# Patient Record
Sex: Male | Born: 1965 | Race: White | Hispanic: No | Marital: Married | Smoking: Never smoker
Health system: Southern US, Community
[De-identification: ages and names within clinical notes are randomized; demographics above are authoritative.]

## PROBLEM LIST (undated history)

## (undated) DIAGNOSIS — M1990 Unspecified osteoarthritis, unspecified site: Secondary | ICD-10-CM

## (undated) DIAGNOSIS — R519 Headache, unspecified: Secondary | ICD-10-CM

## (undated) DIAGNOSIS — T7840XA Allergy, unspecified, initial encounter: Secondary | ICD-10-CM

## (undated) DIAGNOSIS — E119 Type 2 diabetes mellitus without complications: Secondary | ICD-10-CM

## (undated) DIAGNOSIS — R51 Headache: Secondary | ICD-10-CM

## (undated) DIAGNOSIS — A4902 Methicillin resistant Staphylococcus aureus infection, unspecified site: Secondary | ICD-10-CM

## (undated) DIAGNOSIS — I10 Essential (primary) hypertension: Secondary | ICD-10-CM

## (undated) DIAGNOSIS — N200 Calculus of kidney: Secondary | ICD-10-CM

## (undated) DIAGNOSIS — R7301 Impaired fasting glucose: Secondary | ICD-10-CM

## (undated) HISTORY — PX: COLONOSCOPY: SHX174

## (undated) HISTORY — DX: Headache, unspecified: R51.9

## (undated) HISTORY — DX: Headache: R51

## (undated) HISTORY — DX: Allergy, unspecified, initial encounter: T78.40XA

## (undated) HISTORY — DX: Essential (primary) hypertension: I10

## (undated) HISTORY — DX: Calculus of kidney: N20.0

## (undated) HISTORY — PX: OTHER SURGICAL HISTORY: SHX169

## (undated) HISTORY — DX: Impaired fasting glucose: R73.01

## (undated) HISTORY — DX: Type 2 diabetes mellitus without complications: E11.9

## (undated) HISTORY — PX: ELBOW SURGERY: SHX618

## (undated) HISTORY — DX: Methicillin resistant Staphylococcus aureus infection, unspecified site: A49.02

## (undated) HISTORY — DX: Unspecified osteoarthritis, unspecified site: M19.90

---

## 2006-11-06 ENCOUNTER — Telehealth: Payer: Self-pay | Admitting: Family Medicine

## 2006-11-06 ENCOUNTER — Ambulatory Visit: Payer: Self-pay | Admitting: Family Medicine

## 2006-11-06 DIAGNOSIS — M109 Gout, unspecified: Secondary | ICD-10-CM | POA: Insufficient documentation

## 2006-11-06 HISTORY — DX: Gout, unspecified: M10.9

## 2006-11-07 ENCOUNTER — Encounter: Payer: Self-pay | Admitting: Family Medicine

## 2006-11-10 ENCOUNTER — Telehealth (INDEPENDENT_AMBULATORY_CARE_PROVIDER_SITE_OTHER): Payer: Self-pay | Admitting: *Deleted

## 2006-11-24 ENCOUNTER — Ambulatory Visit: Payer: Self-pay | Admitting: Family Medicine

## 2006-11-24 DIAGNOSIS — E1159 Type 2 diabetes mellitus with other circulatory complications: Secondary | ICD-10-CM | POA: Insufficient documentation

## 2006-11-24 DIAGNOSIS — I10 Essential (primary) hypertension: Secondary | ICD-10-CM | POA: Insufficient documentation

## 2006-11-24 DIAGNOSIS — I152 Hypertension secondary to endocrine disorders: Secondary | ICD-10-CM | POA: Insufficient documentation

## 2006-11-25 ENCOUNTER — Encounter: Payer: Self-pay | Admitting: Family Medicine

## 2006-11-25 LAB — CONVERTED CEMR LAB
Alkaline Phosphatase: 95 units/L (ref 39–117)
BUN: 11 mg/dL (ref 6–23)
CO2: 20 meq/L (ref 19–32)
Calcium: 9.3 mg/dL (ref 8.4–10.5)
Cholesterol: 179 mg/dL (ref 0–200)
Creatinine, Ser: 1.37 mg/dL (ref 0.40–1.50)
Glucose, Bld: 70 mg/dL (ref 70–99)
LDL Cholesterol: 105 mg/dL — ABNORMAL HIGH (ref 0–99)
Total Bilirubin: 0.5 mg/dL (ref 0.3–1.2)
Total CHOL/HDL Ratio: 3.7
Triglycerides: 126 mg/dL (ref <150)
VLDL: 25 mg/dL (ref 0–40)

## 2006-11-26 ENCOUNTER — Encounter: Payer: Self-pay | Admitting: Family Medicine

## 2007-01-05 ENCOUNTER — Ambulatory Visit: Payer: Self-pay | Admitting: Family Medicine

## 2007-02-16 ENCOUNTER — Ambulatory Visit: Payer: Self-pay | Admitting: Family Medicine

## 2007-02-16 DIAGNOSIS — K229 Disease of esophagus, unspecified: Secondary | ICD-10-CM | POA: Insufficient documentation

## 2007-05-01 ENCOUNTER — Ambulatory Visit: Payer: Self-pay | Admitting: Family Medicine

## 2007-05-01 DIAGNOSIS — M7020 Olecranon bursitis, unspecified elbow: Secondary | ICD-10-CM | POA: Insufficient documentation

## 2009-07-06 ENCOUNTER — Ambulatory Visit: Payer: Self-pay | Admitting: Family Medicine

## 2009-07-06 ENCOUNTER — Encounter: Admission: RE | Admit: 2009-07-06 | Discharge: 2009-07-06 | Payer: Self-pay | Admitting: Family Medicine

## 2009-07-14 ENCOUNTER — Encounter: Payer: Self-pay | Admitting: Family Medicine

## 2010-06-15 ENCOUNTER — Ambulatory Visit: Payer: Self-pay | Admitting: Family Medicine

## 2010-06-15 ENCOUNTER — Encounter: Payer: Self-pay | Admitting: Family Medicine

## 2010-06-15 DIAGNOSIS — R7301 Impaired fasting glucose: Secondary | ICD-10-CM | POA: Insufficient documentation

## 2010-06-15 DIAGNOSIS — D751 Secondary polycythemia: Secondary | ICD-10-CM

## 2010-06-15 DIAGNOSIS — R109 Unspecified abdominal pain: Secondary | ICD-10-CM | POA: Insufficient documentation

## 2010-06-15 DIAGNOSIS — R634 Abnormal weight loss: Secondary | ICD-10-CM | POA: Insufficient documentation

## 2010-06-15 HISTORY — DX: Secondary polycythemia: D75.1

## 2010-06-15 LAB — CONVERTED CEMR LAB
Bilirubin Urine: NEGATIVE
Blood Glucose, Fingerstick: 118
Eosinophils Relative: 2 % (ref 0–5)
Glucose, Urine, Semiquant: NEGATIVE
HCT: 56.5 % — ABNORMAL HIGH (ref 39.0–52.0)
Ketones, urine, test strip: NEGATIVE
MCHC: 34.2 g/dL (ref 30.0–36.0)
Monocytes Absolute: 0.9 10*3/uL (ref 0.1–1.0)
Monocytes Relative: 9 % (ref 3–12)
Neutrophils Relative %: 63 % (ref 43–77)
Platelets: 167 10*3/uL (ref 150–400)
RBC: 5.88 M/uL — ABNORMAL HIGH (ref 4.22–5.81)
RDW: 13.1 % (ref 11.5–15.5)
pH: 6

## 2010-06-21 LAB — CONVERTED CEMR LAB
Alkaline Phosphatase: 80 units/L (ref 39–117)
CO2: 25 meq/L (ref 19–32)
Chloride: 105 meq/L (ref 96–112)
Creatinine, Ser: 1.33 mg/dL (ref 0.40–1.50)
Glucose, Bld: 123 mg/dL — ABNORMAL HIGH (ref 70–99)
Potassium: 4.4 meq/L (ref 3.5–5.3)
Sodium: 144 meq/L (ref 135–145)
TSH: 1.888 microintl units/mL (ref 0.350–4.500)
Total Bilirubin: 0.6 mg/dL (ref 0.3–1.2)
Total Protein: 7.4 g/dL (ref 6.0–8.3)

## 2010-07-06 ENCOUNTER — Ambulatory Visit: Admit: 2010-07-06 | Payer: Self-pay | Admitting: Family Medicine

## 2010-07-20 ENCOUNTER — Encounter: Payer: Self-pay | Admitting: Family Medicine

## 2010-07-20 ENCOUNTER — Ambulatory Visit
Admission: RE | Admit: 2010-07-20 | Discharge: 2010-07-20 | Payer: Self-pay | Source: Home / Self Care | Attending: Family Medicine | Admitting: Family Medicine

## 2010-07-20 DIAGNOSIS — Z87442 Personal history of urinary calculi: Secondary | ICD-10-CM | POA: Insufficient documentation

## 2010-07-20 HISTORY — DX: Personal history of urinary calculi: Z87.442

## 2010-07-20 LAB — CONVERTED CEMR LAB
Bilirubin Urine: NEGATIVE
Glucose, Urine, Semiquant: NEGATIVE
Hgb A1c MFr Bld: 6.1 %
Ketones, urine, test strip: NEGATIVE
Nitrite: NEGATIVE
Protein, U semiquant: 100
Specific Gravity, Urine: >=1.03
Urobilinogen, UA: 0.2
WBC Urine, dipstick: NEGATIVE
pH: 5.5

## 2010-07-25 NOTE — Consult Note (Signed)
Summary: [facility]  [facility]   Imported By: Lanelle Bal 07/26/2009 08:49:51  _____________________________________________________________________  External Attachment:    Type:   Image     Comment:   External Document

## 2010-07-25 NOTE — Assessment & Plan Note (Signed)
Summary: olecranon bursitis   Vital Signs:  Patient profile:   45 year old male Height:      74 inches Weight:      277 pounds BMI:     35.69 Temp:     98.3 degrees F oral Pulse rate:   86 / minute BP sitting:   144 / 96  (left arm) Cuff size:   large  Vitals Entered By: Payton Spark CMA (July 06, 2009 11:08 AM) CC: F/U R elbow pain x 2 years Pain Assessment Patient in pain? yes     Location: R elbow Intensity: 9   Primary Care Provider:  Seymour Bars D.O.  CC:  F/U R elbow pain x 2 years.  History of Present Illness: 45 yo WM presents for continued R elbow pain x 2 yrs.  His swelling comes and goes.  He has a hard knot.  It stayes sore.  Not taking anything for pain.  Hurts day and night.  Limits full extension.  Denies repetitive trauma at work or at home.  Sensitive to touch.  Denies heat but has some redness.  Hit it about 2 yrs ago but never had it xrayed.  Has not tried wrapping the elbow.  Denies fevers.    Had it aspirated 2 yrs ago but came right back.    Current Medications (verified): 1)  None  Allergies (verified): No Known Drug Allergies  Past History:  Past Medical History: Reviewed history from 01/05/2007 and no changes required. HTN -  MRSA 2007 gout  Past Surgical History: Reviewed history from 11/06/2006 and no changes required. sinus surgery, palate surgery for sleep apnea  Social History: Reviewed history from 01/05/2007 and no changes required. 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  Has 3 kids.  Never smoked.  Physically active.  5-6 beers a day.  Review of Systems      See HPI  Physical Exam  General:  alert, well-developed, well-nourished, and well-hydrated.   Head:  normocephalic and atraumatic.   Mouth:  pharynx pink and moist.   Neck:  no masses.   Lungs:  Normal respiratory effort, chest expands symmetrically. Lungs are clear to auscultation, no crackles or wheezes. Heart:  Normal rate and regular  rhythm. S1 and S2 normal without gallop, murmur, click, rub or other extra sounds. Msk:  bursitis over the R olecranon process, mostly hard to palpation and not fluctuant, red, warm or very tender.  No bruising.  full R elbow extension limited to 10 deg, full flexion, supination and pronation. grip + 5/5 bilat Pulses:  2+ bilat radial pulses Neurologic:  sensation intact to light touch.   Skin:  color normal.     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 No sign of infection and not much fluid on exam today to aspirate.  He had no releif by aspirating this 2 yrs ago. He has tried OTC NSAIDs w/o relief.  Xray today to look for fx or bony degeneration. Info given to pt on olecranon bursitis. Schedule with sports medicine for further tx.  Problem # 2:  HYPERTENSION, BENIGN ESSENTIAL (ICD-401.1) BP high today, off meds.  Start Norvasc 5 mg/ day and f/u with labs in 2 mos. The following medications were removed from the medication list:    Benicar 20 Mg Tabs (Olmesartan medoxomil) .Marland Kitchen... 1 tab by mouth daily His updated medication list for this  problem includes:    Norvasc 5 Mg Tabs (Amlodipine besylate) .Marland Kitchen... 1 tab by mouth daily  BP today: 144/96 Prior BP: 148/100 (05/01/2007)  Labs Reviewed: K+: 4.9 (11/25/2006) Creat: : 1.37 (11/25/2006)   Chol: 179 (11/25/2006)   HDL: 49 (11/25/2006)   LDL: 105 (11/25/2006)   TG: 126 (11/25/2006)  Problem # 3:  GOUT NOS (ICD-274.9) He will likely need joint aspirated.  By palpation, there is not much fluid today, so did not attempt aspiration.  I would check for uric acid crystals to see if this is actually gout in his elbow.  No sign of bony degeneration on Xray today.  Complete Medication List: 1)  Norvasc 5 Mg Tabs (Amlodipine besylate) .Marland Kitchen.. 1 tab by mouth daily  Other Orders: T-DG Elbow Complete*R* (52841)  Patient  Instructions: 1)  Xray Elbow today. 2)  Will call you w/ results tomorrow. 3)  Will likely need to see Dr Margaretha Sheffield downstairs for chronic olecranon bursitis. 4)  Start Norvasc once daily for high BP. 5)  f/u in 2 mos. Prescriptions: NORVASC 5 MG TABS (AMLODIPINE BESYLATE) 1 tab by mouth daily  #30 x 3   Entered and Authorized by:   Seymour Bars DO   Signed by:   Seymour Bars DO on 07/06/2009   Method used:   Electronically to        [facility] [phone]* (retail)       [address] [place], [place]  [address]       Ph: [phone]       Fax: [phone]   RxID:   [id number]

## 2010-07-26 NOTE — Assessment & Plan Note (Signed)
Summary: inguinal pain/ BP   Vital Signs:  Patient profile:   45 year old male Height:      74 inches Weight:      258 pounds Pulse rate:   79 / minute BP sitting:   167 / 113  (right arm) Cuff size:   large  Vitals Entered By: Avon Gully CMA, Duncan Dull) (June 15, 2010 8:47 AM) CC: pain in groin on both sides x 6 weeks, feels like a sharp stabbing pain CBG Result 118   Primary Care Makaley Storts:  Seymour Bars D.O.  CC:  pain in groin on both sides x 6 weeks and feels like a sharp stabbing pain.  History of Present Illness: 45 yo WM present for pain in the groin on both sides and off x 6 wks.  Hurts at rest and with walking.  It is sharp pain that lasts a few sec, moderate intensity w/o radiation.  Denies any problems walking.  No change in physical activity level at onset.  No constipation, blood in stools.  He has urinary frequency.  Denies any dysuria.  Denies any radiation into the scrotum.  Denies testicular pain.  He has no energy and is losing wt w/o trying.  has more blurry vision.  No CP, SOB, swelling, palpiations or HAs.  He has been off his BP meds for about 2 yrs after losing his job.   Current Medications (verified): 1)  None  Allergies (verified): No Known Drug Allergies  Comments:  Nurse/Medical Assistant: The patient's medications and allergies were reviewed with the patient and were updated in the Medication and Allergy Lists. Avon Gully CMA, Duncan Dull) (June 15, 2010 8:47 AM)  Past History:  Past Medical History: Reviewed history from 01/05/2007 and no changes required. 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  Never smoked.  Physically active.  5-6 beers a day.  Review of Systems      See HPI  Physical Exam  General:  alert, well-developed,  well-nourished, and well-hydrated.   Head:  normocephalic and atraumatic.   Eyes:  pupils equal, pupils round, and pupils reactive to light.   Ears:  no external deformities.   Nose:  no nasal discharge.   Mouth:  pharynx pink and moist.   Neck:  no masses.   Lungs:  Normal respiratory effort, chest expands symmetrically. Lungs are clear to auscultation, no crackles or wheezes. Heart:  Normal rate and regular rhythm. S1 and S2 normal without gallop, murmur, click, rub or other extra sounds. Abdomen:  soft, non-tender, normal bowel sounds, no distention, no masses, no guarding, no rebound tenderness, no abdominal hernia, no inguinal hernia, no hepatomegaly, and no splenomegaly.   Msk:  neg bilat FABER test.  full ext rotation both hips w/o pain.  gait normal Pulses:  2+ radial pulses Extremities:  no E/C/C Skin:  color normal.   Psych:  good eye contact, not anxious appearing, and not depressed appearing.     Impression & Recommendations:  Problem # 1:  HYPERTENSION, BENIGN ESSENTIAL (ICD-401.1) Assessment Deteriorated BP HIGH with blurry vision off BP meds  ~2 yrs.   Will start Lisinopril once daily and update labs today.  Proteinuria likely secondary to HTN.  RTC 3 wks. The following medications were removed from the medication list:    Norvasc 5 Mg Tabs (Amlodipine  besylate) .Marland Kitchen... 1 tab by mouth daily His updated medication list for this problem includes:    Lisinopril 20 Mg Tabs (Lisinopril) .Marland Kitchen... 1 tab by mouth daily  Orders: T-Comprehensive Metabolic Panel ([id number])  BP today: 167/113 Prior BP: 144/96 (07/06/2009)  Labs Reviewed: K+: 4.9 (11/25/2006) Creat: : 1.37 (11/25/2006)   Chol: 179 (11/25/2006)   HDL: 49 (11/25/2006)   LDL: 105 (11/25/2006)   TG: 126 (11/25/2006)  Problem # 2:  INGUINAL PAIN, BILATERAL (ICD-789.09) Assessment: New  Unsure of etiology.  Unable to reproduce pain on exam today.  Does not seem MSK.  UA neg for infection/ blood. No inguinal LA or  hernia present.    Orders: Fingerstick ([address]11)  Problem # 3:  WEIGHT LOSS, ABNORMAL (ICD-783.21) Will check labs today.  He had some glucosuria and a fasting sugar of 118 c/w IFG today but not enough to explain his wt loss. Per her hx, he says that he was seeing [facility]  ~6 wks ago for low WBCs.  Will f/u his blood counts next wk. Orders: T-CBC w/Diff ([id number]) T-TSH [id number]) UA Dipstick w/o Micro (automated)  (81003)  Complete Medication List: 1)  Lisinopril 20 Mg Tabs (Lisinopril) .Marland Kitchen.. 1 tab by mouth daily  Other Orders: T-Uric Acid (Blood) (84550-23180) Glucose, (CBG) (96295)  Patient Instructions: 1)  Start Lisinopril once daily for high blood pressure. 2)  Update labs downstairs today. 3)  Will call you w/ results on Tuesday. 4)  Return for follow up in 3 wks. Prescriptions: LISINOPRIL 20 MG TABS (LISINOPRIL) 1 tab by mouth daily  #30 x 1   Entered and Authorized by:   Seymour Bars DO   Signed by:   Seymour Bars DO on 06/15/2010   Method used:   Electronically to        [facility] [phone]* (retail)       [address] [place], [place]  [address]       Ph: [phone]       Fax: [phone]   RxID:   [id number]    Orders Added: 1)  T-CBC w/Diff [[id number]] 2)  T-Comprehensive Metabolic Panel [80053-22900] 3)  T-Uric Acid (Blood) [84550-23180] 4)  T-TSH [10932-35573] 5)  Est. Patient Level IV [22025] 6)  UA Dipstick w/o Micro (automated)  [81003] 7)  Fingerstick [36416] 8)  Glucose, (CBG) [82962]    Laboratory Results   Urine Tests  Date/Time Received: 06/15/10 Date/Time Reported: 06/15/10  Routine Urinalysis   Color: yellow Appearance: Clear Glucose: negative   (Normal Range: Negative) Bilirubin: negative   (Normal Range: Negative) Ketone: negative   (Normal Range: Negative) Spec. Gravity: 1.025   (Normal Range: 1.003-1.035) Blood: small   (Normal Range: Negative) pH: 6.0   (Normal Range: 5.0-8.0) Protein: >=300    (Normal Range: Negative) Urobilinogen: 0.2   (Normal Range: 0-1) Nitrite: negative   (Normal Range: Negative) Leukocyte Esterace: negative   (Normal Range: Negative)     Blood Tests   Date/Time Received: 06/15/10 Date/Time Reported: 06/15/10  CBG Random:: 118mg /dL

## 2010-07-26 NOTE — Assessment & Plan Note (Signed)
Summary: f/u HTN   Vital Signs:  Patient profile:   45 year old male Height:      74 inches Weight:      257 pounds BMI:     33.12 O2 Sat:      96 % on Room air Pulse rate:   90 / minute BP sitting:   155 / 108  (left arm) Cuff size:   large  Vitals Entered By: Payton Spark CMA (July 20, 2010 9:16 AM)  O2 Flow:  Room air CC: F/U BP   Primary Care Provider:  Seymour Bars D.O.  CC:  F/U BP.  History of Present Illness: 45 yo WM presents for f/u HTN.    He is having a hard time remembering to take his medicine has has not taken it in 1 wk.  He was feeling a little tired from new start but no other symptoms.    He went to the ED a wk ago for a ? kidney stone -- he had a ? small stone on xray.  He was having dysuria and hematuria.  He is no longer having symptoms.  He was having R flank pain which did move to the RUQ but it has not bothered him in a while.  His fasting sugar was 123 on recent labs and his uric acid level was high -- hx of gout but he has been on allopurinol and uloric in the past and is not interested in restarting b/c he has not had a flare up in 3 yrs.    Denies CP, palpitations, SOB or leg swelling or blurry vision.      Current Medications (verified): 1)  Lisinopril 20 Mg Tabs (Lisinopril) .Marland Kitchen.. 1 Tab By Mouth Daily  Allergies (verified): No Known Drug Allergies  Past History:  Past Medical History: HTN -  MRSA 2007 gout kidney stones IFG  Past Surgical History: Reviewed history from 06/15/2010 and no changes required. 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  Never smoked.  Physically active.  5-6 beers a day.  Review of Systems      See HPI  Physical Exam  General:  alert, well-developed, well-nourished, and well-hydrated.   Head:  normocephalic and atraumatic.   Eyes:  pupils equal, pupils  round, and pupils reactive to light.   Mouth:  pharynx pink and moist.   Neck:  no masses.   Lungs:  Normal respiratory effort, chest expands symmetrically. Lungs are clear to auscultation, no crackles or wheezes. Heart:  Normal rate and regular rhythm. S1 and S2 normal without gallop, murmur, click, rub or other extra sounds. Abdomen:  no CVAT Extremities:  no LE edema Skin:  color normal.   Psych:  good eye contact, not anxious appearing, and not depressed appearing.     Impression & Recommendations:  Problem # 1:  HYPERTENSION, BENIGN ESSENTIAL (ICD-401.1) BP high secondary to medication non compliance.  We discussed a way to improve this and gave him #90 so that he can keep some in [place] where he spends half his time AND will have him set a cell phone alarm as a reminder.  Will wait to recheck BMP since he's not been taking it. His updated medication list for this problem includes:    Lisinopril 20 Mg Tabs (Lisinopril) .Marland Kitchen... 1 tab by mouth daily  BP today: 155/108 Prior BP: 167/113 (06/15/2010)  Labs  Reviewed: K+: 4.4 (06/15/2010) Creat: : 1.33 (06/15/2010)   Chol: 179 (11/25/2006)   HDL: 49 (11/25/2006)   LDL: 105 (11/25/2006)   TG: 126 (11/25/2006)  Problem # 2:  GOUT NOS (ICD-274.9)  Uric Acid was high but he is rarely having flare ups, so he has declined prophylactic treatment.  Celebrex has worked well for rescue in the past.    His updated medication list for this problem includes:    Celebrex 200 Mg Caps (Celecoxib) .Marland Kitchen... 1 capsule by mouth two times a day as needed gouty flare  Problem # 3:  IMPAIRED FASTING GLUCOSE (ICD-790.21) A1C 6.1 c/w IFG.  H/o given to pt on diet/ exercise plan for IFG.   Orders: Fingerstick (36416) Hemoglobin A1C (83036)  Problem # 4:  NEPHROLITHIASIS (ICD-592.0) UA still + for blood.  Stone likely still in the R ureter and has not moved in a few days which is why his pain has resolved for the time being.  Will fill some Percocet for as  needed use if pain returns.  Hydrate well w/ water and call if any changes.  Recheck in 4 wks.   Orders: UA Dipstick w/o Micro (automated)  (81003)  Complete Medication List: 1)  Lisinopril 20 Mg Tabs (Lisinopril) .Marland Kitchen.. 1 tab by mouth daily 2)  Percocet 5-325 Mg Tabs (Oxycodone-acetaminophen) .Marland Kitchen.. 1-2 tabs by mouth three times a day as needed severe pain from kidney stone 3)  Celebrex 200 Mg Caps (Celecoxib) .Marland Kitchen.. 1 capsule by mouth two times a day as needed gouty flare  Patient Instructions: 1)  A1C: 2)  Take LISINOPRIL everyday for high BP. 3)  Urine +++ for blood, stone probably sitting in ureter. 4)  It may start to cause pain again.  If it does, you can use the RX Percocet.  If not improving, please call back. 5)  Return for f/u BP/ stone in 4 wks. Prescriptions: PERCOCET 5-325 MG TABS (OXYCODONE-ACETAMINOPHEN) 1-2 tabs by mouth three times a day as needed severe pain from kidney stone  #24 x 0   Entered and Authorized by:   Seymour Bars DO   Signed by:   Seymour Bars DO on 07/20/2010   Method used:   Print then Give to Patient   RxID:   [id number] LISINOPRIL 20 MG TABS (LISINOPRIL) 1 tab by mouth daily  #90 x 1   Entered and Authorized by:   Seymour Bars DO   Signed by:   Seymour Bars DO on 07/20/2010   Method used:   Electronically to        [facility] [phone]* (retail)       [address] [place], [place]  [address]       Ph: [phone]       Fax: [phone]   RxID:   [id number]    Orders Added: 1)  Fingerstick [36416] 2)  Hemoglobin A1C [83036] 3)  UA Dipstick w/o Micro (automated)  [81003] 4)  Est. Patient Level IV [51884]    Laboratory Results   Urine Tests    Routine Urinalysis   Color: yellow Appearance: Clear Glucose: negative   (Normal Range: Negative) Bilirubin: negative   (Normal Range: Negative) Ketone: negative   (Normal Range: Negative) Spec. Gravity: >=1.030   (Normal Range: 1.003-1.035) Blood: moderate   (Normal Range:  Negative) pH: 5.5   (Normal Range: 5.0-8.0) Protein: 100   (Normal Range: Negative) Urobilinogen: 0.2   (Normal Range: 0-1) Nitrite:  negative   (Normal Range: Negative) Leukocyte Esterace: negative   (Normal Range: Negative)     Blood Tests     HGBA1C: 6.1%   (Normal Range: Non-Diabetic - 3-6%   Control Diabetic - 6-8%)

## 2010-08-15 NOTE — Letter (Signed)
Summary: [facility]   Imported By: Kassie Mends 08/09/2010 09:08:45  _____________________________________________________________________  External Attachment:    Type:   Image     Comment:   External Document

## 2010-10-19 ENCOUNTER — Encounter: Payer: Self-pay | Admitting: Family Medicine

## 2010-10-19 ENCOUNTER — Ambulatory Visit (INDEPENDENT_AMBULATORY_CARE_PROVIDER_SITE_OTHER): Payer: BC Managed Care – PPO | Admitting: Family Medicine

## 2010-10-19 ENCOUNTER — Ambulatory Visit: Payer: Self-pay | Admitting: Family Medicine

## 2010-10-19 DIAGNOSIS — R5383 Other fatigue: Secondary | ICD-10-CM

## 2010-10-19 DIAGNOSIS — R5381 Other malaise: Secondary | ICD-10-CM

## 2010-10-19 DIAGNOSIS — D751 Secondary polycythemia: Secondary | ICD-10-CM

## 2010-10-19 DIAGNOSIS — E785 Hyperlipidemia, unspecified: Secondary | ICD-10-CM

## 2010-10-19 DIAGNOSIS — M109 Gout, unspecified: Secondary | ICD-10-CM

## 2010-10-19 DIAGNOSIS — I10 Essential (primary) hypertension: Secondary | ICD-10-CM

## 2010-10-19 MED ORDER — AMLODIPINE-OLMESARTAN 5-40 MG PO TABS: 1.0000 | ORAL_TABLET | Freq: Every day | ORAL | Status: DC

## 2010-10-19 MED ORDER — CELECOXIB 200 MG PO CAPS: ORAL_CAPSULE | ORAL | Status: AC

## 2010-10-19 NOTE — Assessment & Plan Note (Signed)
BP still HIGH on Lisinopril even w/ compliance.  Due for labs today and will change him to Azor 5-40 1 tab daily.  RTC for a nurse BP check in 3 wks.  Call if any problemsl

## 2010-10-19 NOTE — Assessment & Plan Note (Signed)
Reviwed notes from Dr Abbe Amsterdam.  Went for f/u today.  Getting wkly phlebotomy.  Next step is to see ENT, get sleep study then a bone marrow biopsy.

## 2010-10-19 NOTE — Patient Instructions (Signed)
Labs today.   Will call you w/ results Monday.  Change Lisinopril to Azor - 1 tab daily.  Return for a nurse BP check in 3 wks.

## 2010-10-19 NOTE — Progress Notes (Signed)
  Subjective:    Patient ID: Jonathan Oliver, male    DOB: [birth year], 45 y.o.   MRN: [id number]  HPI  45 yo WM presents for f/u visit.  He has HTN and gout.  He ran out of Celebrex and needs RX filled.  Having more pain in his foot now.  He is due for labs today.  C/o feeling tired.  Going thru w/u with Dr Abbe Amsterdam for erythrocytosis.  He has been getting phlebotemized weekly which now has his Hct down to 46.  He went today for a hematology visit.  He is due to see ENT for a ? Broken nose and will likely need repeat OSA screening with a sleep study.  The next step is bone marrow biopsy.    He is now taking Lisinopril everyday but his BP is still high.  He hasn't really made any changes to his diet or exercise.    BP 154/110  Pulse 100  Ht 6\' 6"  (1.981 m)  Wt 258 lb (117.028 kg)  BMI 29.81 kg/m2  SpO2 98% Patient Active Problem List  Diagnoses  . GOUT NOS  . HYPERTENSION, BENIGN ESSENTIAL  . DISORDER, ESOPHAGEAL NOS  . OLECRANON BURSITIS  . WEIGHT LOSS, ABNORMAL  . INGUINAL PAIN, BILATERAL  . POLYCYTHEMIA  . NEPHROLITHIASIS  . IMPAIRED FASTING GLUCOSE      Review of Systems  Constitutional: Positive for fatigue. Negative for fever, chills and unexpected weight change.  Eyes: Negative for visual disturbance.  Respiratory: Negative for cough and shortness of breath.   Cardiovascular: Positive for chest pain. Negative for leg swelling.  Gastrointestinal: Negative for abdominal pain.  Genitourinary: Negative for difficulty urinating.  Musculoskeletal: Positive for arthralgias (MTP pain).  Neurological: Negative for headaches.  Hematological: Does not bruise/bleed easily.  Psychiatric/Behavioral: Negative for dysphoric mood.       Objective:   Physical Exam  Constitutional: He appears well-developed and well-nourished. No distress.  Neck: Neck supple.  Cardiovascular: Normal rate, regular rhythm and normal heart sounds.   Pulmonary/Chest: Effort normal and breath sounds normal.  No respiratory distress.  Musculoskeletal: He exhibits no edema.  Lymphadenopathy:    He has no cervical adenopathy.  Skin: Skin is warm and dry.       Slightly ruddy complexion  Psychiatric: He has a normal mood and affect.          Assessment & Plan:  Fatigue- check Testosterone and TSH with labs today and a B12 level.

## 2010-10-19 NOTE — Assessment & Plan Note (Signed)
He has declined prophylactic treatment  And needs RF of Celebrex today.

## 2010-10-20 LAB — COMPLETE METABOLIC PANEL WITH GFR
ALT: 19 U/L (ref 0–53)
AST: 22 U/L (ref 0–37)
Albumin: 3.7 g/dL (ref 3.5–5.2)
CO2: 26 mEq/L (ref 19–32)
Calcium: 9.3 mg/dL (ref 8.4–10.5)
Creat: 1.23 mg/dL (ref 0.40–1.50)
Total Bilirubin: 0.3 mg/dL (ref 0.3–1.2)
Total Protein: 6.9 g/dL (ref 6.0–8.3)

## 2010-10-20 LAB — TSH: TSH: 1.232 u[IU]/mL (ref 0.350–4.500)

## 2010-10-20 LAB — LDL CHOLESTEROL, DIRECT: Direct LDL: 107 mg/dL — ABNORMAL HIGH

## 2010-10-22 ENCOUNTER — Telehealth: Payer: Self-pay | Admitting: Family Medicine

## 2010-10-22 NOTE — Telephone Encounter (Signed)
LMOM informing Pt of the above 

## 2010-10-22 NOTE — Telephone Encounter (Signed)
Pls see if the lab can add on an A1C.  Elevated glucose. Pls let pt know that his sugar was elevated at 174 with normal liver and kidney function.  His cholesterol looks good.  His testosterone was a bit low and so was his b12.  Thyroid function looks normal.  I'd like him to start an OTC B - complex vitamin.    Let him know I'm adding another test to his labs for his sugar to follow.

## 2010-10-30 ENCOUNTER — Telehealth: Payer: Self-pay | Admitting: Family Medicine

## 2010-10-30 NOTE — Telephone Encounter (Signed)
Pt called and wanted testosterone lab result.  Gave the lab result but he said he is still having problems with ED and his wife is getting upset.  Requested to talk with Dr. Cathey Endow tomorrow after 3:30pm by phone.  Legs and neck hurting since started new BP med Azor 5/40mg .   Will come to come in Friday as instructed to do nurse visit for B/P. Plan:  Routed to Dr. Arlice Colt, LPN Domingo Dimes

## 2010-10-31 NOTE — Telephone Encounter (Signed)
I called pt back and LMOM for him to call me back tomorrow. Can we make Friday's nurse visit an OV?

## 2010-11-01 NOTE — Telephone Encounter (Signed)
I spoke to pt last night at 5pm re: his labs and his symptoms.  He wants to start testosterone replacement if his BP is at goal on Azor.  Says it was good when he was last phlebotomized.  He feels a little wiped out from the new medicine.  Instructed to change it to bedtime to see if this helps.  He opted to not treat his ED with meds at this time.  I explained that his sugar and BP are likely contributing to his ED.  He will come see me for OV on Fri.

## 2010-11-02 ENCOUNTER — Ambulatory Visit (INDEPENDENT_AMBULATORY_CARE_PROVIDER_SITE_OTHER): Payer: BC Managed Care – PPO | Admitting: Family Medicine

## 2010-11-02 ENCOUNTER — Encounter: Payer: Self-pay | Admitting: Family Medicine

## 2010-11-02 VITALS — BP 132/88 | HR 105 | Ht 76.0 in | Wt 258.0 lb

## 2010-11-02 DIAGNOSIS — R7309 Other abnormal glucose: Secondary | ICD-10-CM

## 2010-11-02 DIAGNOSIS — E291 Testicular hypofunction: Secondary | ICD-10-CM

## 2010-11-02 DIAGNOSIS — R7301 Impaired fasting glucose: Secondary | ICD-10-CM

## 2010-11-02 DIAGNOSIS — I10 Essential (primary) hypertension: Secondary | ICD-10-CM

## 2010-11-02 DIAGNOSIS — R739 Hyperglycemia, unspecified: Secondary | ICD-10-CM

## 2010-11-02 DIAGNOSIS — E538 Deficiency of other specified B group vitamins: Secondary | ICD-10-CM

## 2010-11-02 DIAGNOSIS — E349 Endocrine disorder, unspecified: Secondary | ICD-10-CM

## 2010-11-02 LAB — POCT GLYCOSYLATED HEMOGLOBIN (HGB A1C): Hemoglobin A1C: 6

## 2010-11-02 MED ORDER — TESTOSTERONE CYPIONATE 200 MG/ML IM SOLN
200.0000 mg | INTRAMUSCULAR | Status: DC
  Administered 2010-11-02: 200 mg via INTRAMUSCULAR

## 2010-11-02 MED ORDER — CYANOCOBALAMIN 1000 MCG/ML IJ SOLN
1000.0000 ug | INTRAMUSCULAR | Status: DC
  Administered 2010-11-02: 1000 ug via INTRAMUSCULAR

## 2010-11-02 NOTE — Progress Notes (Signed)
  Subjective:    Patient ID: Jonathan Oliver, male    DOB: 1966/02/23, 45 y.o.   MRN: [id number]  HPI 45 yo  WM presents for R neck pain and between his shoulders for weeks.  He thought it was from Azor but we talked on the phone and told him it was not a SE.  He plans to see a Land.  Denies chest pain or SOB.   He feels tired all the time. His sugar had been high but he's not checking at home.  Has IFG.  He had a low testosterone with his labs.   He has not been exercising.  He feels a lack of energy over the past year with loss of muscle mass.  Denies lack of libido, depression but has had ED.    He moved his Azor to bedtime and he is feeling less wiped out from it.  His BP looks great on it.    BP 132/88  Pulse 105  Ht 6\' 4"  (1.93 m)  Wt 258 lb (117.028 kg)  BMI 31.40 kg/m2  SpO2 100%  Patient Active Problem List  Diagnoses  . GOUT NOS  . HYPERTENSION, BENIGN ESSENTIAL  . DISORDER, ESOPHAGEAL NOS  . OLECRANON BURSITIS  . WEIGHT LOSS, ABNORMAL  . INGUINAL PAIN, BILATERAL  . POLYCYTHEMIA  . NEPHROLITHIASIS  . IMPAIRED FASTING GLUCOSE  . Fatigue       Review of Systems  Constitutional: Positive for fatigue and unexpected weight change (gain). Negative for fever.  HENT: Negative for neck pain.   Eyes: Negative for visual disturbance.  Respiratory: Negative for shortness of breath.   Cardiovascular: Negative for chest pain, palpitations and leg swelling.  Genitourinary: Negative for difficulty urinating and testicular pain.  Musculoskeletal: Positive for myalgias and arthralgias.  Neurological: Negative for light-headedness and headaches.  Psychiatric/Behavioral: Negative for sleep disturbance, dysphoric mood and decreased concentration. The patient is not nervous/anxious.        Objective:   Physical Exam  Constitutional: He appears well-developed and well-nourished. No distress.       overwt  HENT:  Head: Normocephalic and atraumatic.  Eyes: Conjunctivae are  normal. No scleral icterus.  Neck: Neck supple. No thyromegaly present.  Cardiovascular: Normal rate, regular rhythm and normal heart sounds.   No murmur heard. Pulmonary/Chest: Effort normal and breath sounds normal. No respiratory distress.  Musculoskeletal: He exhibits no edema.  Skin: Skin is warm and dry.  Psychiatric: He has a normal mood and affect.          Assessment & Plan:

## 2010-11-02 NOTE — Patient Instructions (Signed)
Start Testosterone injections 200 mg every 2 weeks for the next 8 wks the recheck testosterone level. Start B12 injections once a month.    Return for office visit in 3 mos-- will recheck labs, see how you are doing clinically, recheck BP and recheck fasting sugar.

## 2010-11-03 ENCOUNTER — Telehealth: Payer: Self-pay | Admitting: Family Medicine

## 2010-11-03 ENCOUNTER — Encounter: Payer: Self-pay | Admitting: Family Medicine

## 2010-11-03 DIAGNOSIS — Z79899 Other long term (current) drug therapy: Secondary | ICD-10-CM

## 2010-11-03 DIAGNOSIS — E538 Deficiency of other specified B group vitamins: Secondary | ICD-10-CM

## 2010-11-03 DIAGNOSIS — E349 Endocrine disorder, unspecified: Secondary | ICD-10-CM

## 2010-11-03 HISTORY — DX: Deficiency of other specified B group vitamins: E53.8

## 2010-11-03 HISTORY — DX: Endocrine disorder, unspecified: E34.9

## 2010-11-03 NOTE — Assessment & Plan Note (Signed)
Low testosterone on labs with symptoms of ED, fatigue and loss of muscle mass.   We discussed options for treatment and he wants to start injections.  Will start testosterone cypionate 200 mg q 2 wks x 8 wks and then recheck testosterone level.  Will closely watch his BP and Hct given his HTN and polycythemia.  We discussed common adverse SEs and checking his PSA, liver.

## 2010-11-03 NOTE — Assessment & Plan Note (Signed)
B12 was low.  Given his fatigue, will start monthly replacement and recheck his level in 4 mos.

## 2010-11-03 NOTE — Telephone Encounter (Signed)
Michelle, pls see if the lab can add on a PSA to his 4-27 labs.  If not, will print off order for him to draw this in the next week.

## 2010-11-03 NOTE — Assessment & Plan Note (Signed)
BP much improved on Azor and is tolerating it well.  Will continue at current dose.  Recheck labs in 8 wks.

## 2010-11-03 NOTE — Assessment & Plan Note (Signed)
A1C 6.0 c/w IFG range with most recent fasting glucose higher in the 160s.  Working on a low sugar/ low carb diet and will need to add back in exercise.  Will keep a close watch on his fasting sugars.

## 2010-11-05 NOTE — Telephone Encounter (Signed)
Lab only holds blood for 7 days.

## 2010-11-05 NOTE — Telephone Encounter (Signed)
Pls have the front arrive him as a lab only visit for FRI and I will print off order for him to have PSA drawn at the time of his 2nd testosterone injection.

## 2010-11-16 ENCOUNTER — Ambulatory Visit (INDEPENDENT_AMBULATORY_CARE_PROVIDER_SITE_OTHER): Payer: BC Managed Care – PPO | Admitting: Family Medicine

## 2010-11-16 ENCOUNTER — Encounter: Payer: Self-pay | Admitting: Family Medicine

## 2010-11-16 DIAGNOSIS — E291 Testicular hypofunction: Secondary | ICD-10-CM

## 2010-11-16 MED ORDER — TESTOSTERONE CYPIONATE 200 MG/ML IM SOLN
200.0000 mg | INTRAMUSCULAR | Status: DC
  Administered 2010-11-16: 200 mg via INTRAMUSCULAR

## 2010-11-17 ENCOUNTER — Telehealth: Payer: Self-pay | Admitting: Family Medicine

## 2010-11-17 NOTE — Telephone Encounter (Signed)
Pls let pt know that his PSA looks perfect.  Will repeat in 12 wks.

## 2010-11-20 NOTE — Telephone Encounter (Signed)
Pt aware of the aboev

## 2010-11-30 ENCOUNTER — Ambulatory Visit (INDEPENDENT_AMBULATORY_CARE_PROVIDER_SITE_OTHER): Payer: BC Managed Care – PPO | Admitting: Family Medicine

## 2010-11-30 DIAGNOSIS — E291 Testicular hypofunction: Secondary | ICD-10-CM

## 2010-11-30 DIAGNOSIS — E538 Deficiency of other specified B group vitamins: Secondary | ICD-10-CM

## 2010-11-30 MED ORDER — CYANOCOBALAMIN 1000 MCG/ML IJ SOLN
1000.0000 ug | INTRAMUSCULAR | Status: DC
  Administered 2010-11-30 – 2010-12-28 (×2): 1000 ug via INTRAMUSCULAR

## 2010-11-30 MED ORDER — TESTOSTERONE CYPIONATE 200 MG/ML IM SOLN
200.0000 mg | INTRAMUSCULAR | Status: DC
  Administered 2010-11-30 – 2011-06-14 (×13): 200 mg via INTRAMUSCULAR

## 2010-12-14 ENCOUNTER — Ambulatory Visit (INDEPENDENT_AMBULATORY_CARE_PROVIDER_SITE_OTHER): Payer: BC Managed Care – PPO | Admitting: Family Medicine

## 2010-12-14 DIAGNOSIS — E291 Testicular hypofunction: Secondary | ICD-10-CM

## 2010-12-14 NOTE — Progress Notes (Signed)
  Subjective:    Patient ID: Jonathan Oliver, male    DOB: [birth year], 45 y.o.   MRN: [id number]  HPI Here for testosterone injection   Review of Systems     Objective:   Physical Exam        Assessment & Plan:

## 2010-12-28 ENCOUNTER — Ambulatory Visit (INDEPENDENT_AMBULATORY_CARE_PROVIDER_SITE_OTHER): Payer: BC Managed Care – PPO | Admitting: Family Medicine

## 2010-12-28 DIAGNOSIS — E349 Endocrine disorder, unspecified: Secondary | ICD-10-CM

## 2010-12-28 DIAGNOSIS — E291 Testicular hypofunction: Secondary | ICD-10-CM

## 2010-12-28 DIAGNOSIS — E538 Deficiency of other specified B group vitamins: Secondary | ICD-10-CM

## 2010-12-28 NOTE — Progress Notes (Signed)
  Subjective:    Patient ID: Jonathan Oliver, male    DOB: [birth year], 45 y.o.   MRN: [id number]  HPI  T  Review of Systems     Objective:   Physical Exam        Assessment & Plan:

## 2011-01-03 ENCOUNTER — Telehealth: Payer: Self-pay | Admitting: Family Medicine

## 2011-01-03 NOTE — Telephone Encounter (Signed)
Patient wants a referral to a chiropractor and not Dr. Myrtis Ser because he is closed on Fridays and patient needs Fridays Appts. Please refer. Thanks, [employer]

## 2011-01-04 NOTE — Telephone Encounter (Signed)
Pt aware of the above  

## 2011-01-04 NOTE — Telephone Encounter (Signed)
Since he does not actually need a referral from me, I'd have him call around.  My next suggestion would be to try Dr Arville Care in [place].

## 2011-01-11 ENCOUNTER — Ambulatory Visit (INDEPENDENT_AMBULATORY_CARE_PROVIDER_SITE_OTHER): Payer: BC Managed Care – PPO | Admitting: Family Medicine

## 2011-01-11 DIAGNOSIS — E291 Testicular hypofunction: Secondary | ICD-10-CM

## 2011-01-11 DIAGNOSIS — E349 Endocrine disorder, unspecified: Secondary | ICD-10-CM

## 2011-01-25 ENCOUNTER — Ambulatory Visit (INDEPENDENT_AMBULATORY_CARE_PROVIDER_SITE_OTHER): Payer: BC Managed Care – PPO | Admitting: Family Medicine

## 2011-01-25 DIAGNOSIS — E291 Testicular hypofunction: Secondary | ICD-10-CM

## 2011-01-25 DIAGNOSIS — E538 Deficiency of other specified B group vitamins: Secondary | ICD-10-CM

## 2011-01-25 MED ORDER — CYANOCOBALAMIN 1000 MCG/ML IJ SOLN
1000.0000 ug | INTRAMUSCULAR | Status: DC
  Administered 2011-01-25: 1000 ug via INTRAMUSCULAR

## 2011-01-25 NOTE — Progress Notes (Signed)
  Subjective:    Patient ID: Jonathan Oliver., male    DOB: 07-02-1965, 45 y.o.   MRN: [id number]  HPI  Here for B12 injection.   Review of Systems     Objective:   Physical Exam        Assessment & Plan:

## 2011-02-08 ENCOUNTER — Ambulatory Visit (INDEPENDENT_AMBULATORY_CARE_PROVIDER_SITE_OTHER): Payer: BC Managed Care – PPO | Admitting: Family Medicine

## 2011-02-08 DIAGNOSIS — E291 Testicular hypofunction: Secondary | ICD-10-CM

## 2011-02-08 DIAGNOSIS — E538 Deficiency of other specified B group vitamins: Secondary | ICD-10-CM

## 2011-02-08 NOTE — Progress Notes (Signed)
  Subjective:    Patient ID: Jonathan Oliver., male    DOB: [birth year], 45 y.o.   MRN: [id number]  HPI Here for test injection.    Review of Systems     Objective:   Physical Exam        Assessment & Plan:  Given lab slip to get levels checked before next injection.

## 2011-02-22 ENCOUNTER — Ambulatory Visit (INDEPENDENT_AMBULATORY_CARE_PROVIDER_SITE_OTHER): Payer: BC Managed Care – PPO | Admitting: Family Medicine

## 2011-02-22 DIAGNOSIS — E291 Testicular hypofunction: Secondary | ICD-10-CM

## 2011-02-22 DIAGNOSIS — E349 Endocrine disorder, unspecified: Secondary | ICD-10-CM

## 2011-02-22 NOTE — Progress Notes (Signed)
Here for test inj.

## 2011-02-23 LAB — TESTOSTERONE: Testosterone: 322.09 ng/dL (ref 250–890)

## 2011-02-23 LAB — VITAMIN B12: Vitamin B-12: 322 pg/mL (ref 211–911)

## 2011-02-25 ENCOUNTER — Telehealth: Payer: Self-pay | Admitting: Family Medicine

## 2011-02-25 NOTE — Telephone Encounter (Signed)
Call pt: Test and B12 look better. Continue current regimen.

## 2011-02-26 NOTE — Telephone Encounter (Signed)
Pt.notified

## 2011-03-06 ENCOUNTER — Telehealth: Payer: Self-pay | Admitting: Family Medicine

## 2011-03-06 ENCOUNTER — Encounter: Payer: Self-pay | Admitting: Family Medicine

## 2011-03-06 ENCOUNTER — Ambulatory Visit (INDEPENDENT_AMBULATORY_CARE_PROVIDER_SITE_OTHER): Payer: BC Managed Care – PPO | Admitting: Family Medicine

## 2011-03-06 ENCOUNTER — Ambulatory Visit
Admission: RE | Admit: 2011-03-06 | Discharge: 2011-03-06 | Disposition: A | Discharge status: Home / Self Care | Payer: BC Managed Care – PPO | Source: Ambulatory Visit | Attending: Family Medicine | Admitting: Family Medicine

## 2011-03-06 VITALS — BP 156/111 | HR 87 | Wt 263.0 lb

## 2011-03-06 DIAGNOSIS — M25572 Pain in left ankle and joints of left foot: Secondary | ICD-10-CM

## 2011-03-06 DIAGNOSIS — M25579 Pain in unspecified ankle and joints of unspecified foot: Secondary | ICD-10-CM

## 2011-03-06 NOTE — Progress Notes (Signed)
  Subjective:    Patient ID: Marnee Spring., male    DOB: 1965/09/29, 45 y.o.   MRN: [id number]  HPI  Left ankle swollen for 5 days.  Felt a little sore initally. No known injuries.  Foot started to turn blue,  Off of it yesterday and it looks better today. Has been taking IBU and Tyelnol - with no relief.  No redness.  Felt it iwas hot.  Hx of gout. Usually has gout only in his toes. His last gout flare was about 2 years ago.  Better with rest. No trauma. Never happened before. Not taking any gout meds.   Review of Systems     Objective:   Physical Exam  Left ankle with 1+ edema. Tender over the medial and lateral malleolus. Tender over the achilles tendon.  NROM but painful with extension. No redness. Slight increased warmth.       Assessment & Plan:  Declied flu vaccine.   Left ankle pain and swelling - Likely gout. Thought will get xray to rule out fracture. The typical treatment of high-dose NSAIDs. He thinks he has a refill for his Celebrex on like to use this. As mentioned, if not he can call the pharmacy to have been sent and a refill request. Also check his uric acid level and CBC today.  Note his blood pressure is elevated today. Normally is well controlled I do think that some of the pressure is secondary to pain.

## 2011-03-06 NOTE — Telephone Encounter (Signed)
Call patient: Ankle X-ray is normal. No fracture. This showed some swelling around the ankle and the soft tissue and a little bit in the actual joint. I don't have his lab results back yet.

## 2011-03-07 ENCOUNTER — Telehealth: Payer: Self-pay | Admitting: Family Medicine

## 2011-03-07 LAB — CBC WITH DIFFERENTIAL/PLATELET
Basophils Absolute: 0.1 10*3/uL (ref 0.0–0.1)
Lymphocytes Relative: 14 % (ref 12–46)
Lymphs Abs: 1.3 10*3/uL (ref 0.7–4.0)
Monocytes Relative: 11 % (ref 3–12)
Neutro Abs: 6.5 10*3/uL (ref 1.7–7.7)
Neutrophils Relative %: 70 % (ref 43–77)
Platelets: 298 10*3/uL (ref 150–400)
RDW: 19.9 % — ABNORMAL HIGH (ref 11.5–15.5)
WBC: 9.2 10*3/uL (ref 4.0–10.5)

## 2011-03-07 LAB — URIC ACID: Uric Acid, Serum: 7.3 mg/dL (ref 4.0–7.8)

## 2011-03-07 NOTE — Telephone Encounter (Signed)
Call patient: History of acid level actually looks great compared to 8 months ago. I think overall this is a good sign. His red blood cell count is just mildly elevated. This does not indicate infection but I would like to recheck his blood count in one month.

## 2011-03-08 ENCOUNTER — Ambulatory Visit (INDEPENDENT_AMBULATORY_CARE_PROVIDER_SITE_OTHER): Payer: BC Managed Care – PPO | Admitting: Family Medicine

## 2011-03-08 DIAGNOSIS — E538 Deficiency of other specified B group vitamins: Secondary | ICD-10-CM

## 2011-03-08 DIAGNOSIS — E349 Endocrine disorder, unspecified: Secondary | ICD-10-CM

## 2011-03-08 DIAGNOSIS — E291 Testicular hypofunction: Secondary | ICD-10-CM

## 2011-03-08 NOTE — Progress Notes (Signed)
  Subjective:    Patient ID: Jonathan Oliver., male    DOB: 08/25/1965, 45 y.o.   MRN: [id number]  HPI Testosterone and B12 needed    Review of Systems     Objective:   Physical Exam        Assessment & Plan:

## 2011-03-08 NOTE — Telephone Encounter (Signed)
Pt aware.

## 2011-03-08 NOTE — Telephone Encounter (Signed)
Pt aware and he sees hematology for elevated red blood count

## 2011-03-22 ENCOUNTER — Ambulatory Visit (INDEPENDENT_AMBULATORY_CARE_PROVIDER_SITE_OTHER): Payer: BC Managed Care – PPO | Admitting: Family Medicine

## 2011-03-22 DIAGNOSIS — E291 Testicular hypofunction: Secondary | ICD-10-CM

## 2011-03-22 DIAGNOSIS — E349 Endocrine disorder, unspecified: Secondary | ICD-10-CM

## 2011-03-22 NOTE — Progress Notes (Signed)
  Subjective:    Patient ID: Jonathan Spring., male    DOB: 08-03-1965, 45 y.o.   MRN: [id number]  HPI testosterone inj needed    Review of Systems     Objective:   Physical Exam        Assessment & Plan:

## 2011-04-04 ENCOUNTER — Ambulatory Visit (INDEPENDENT_AMBULATORY_CARE_PROVIDER_SITE_OTHER): Payer: BC Managed Care – PPO | Admitting: Family Medicine

## 2011-04-04 DIAGNOSIS — E291 Testicular hypofunction: Secondary | ICD-10-CM

## 2011-04-04 DIAGNOSIS — E538 Deficiency of other specified B group vitamins: Secondary | ICD-10-CM

## 2011-04-04 MED ORDER — CYANOCOBALAMIN 1000 MCG/ML IJ SOLN
1000.0000 ug | Freq: Once | INTRAMUSCULAR | Status: AC
  Administered 2011-04-04: 1000 ug via INTRAMUSCULAR

## 2011-04-04 MED ORDER — TESTOSTERONE CYPIONATE 200 MG/ML IM SOLN
200.0000 mg | Freq: Once | INTRAMUSCULAR | Status: AC
  Administered 2011-04-04: 200 mg via INTRAMUSCULAR

## 2011-04-06 ENCOUNTER — Encounter: Payer: Self-pay | Admitting: Family Medicine

## 2011-04-06 NOTE — Progress Notes (Signed)
  Subjective:    Patient ID: Jonathan Oliver., male    DOB: [birth year], 45 y.o.   MRN: [id number]  HPI  Injections needed  Review of Systems    none Objective:   Physical Exam   none     Assessment & Plan:  Injection given

## 2011-04-06 NOTE — Patient Instructions (Signed)
Visit for injection

## 2011-04-19 ENCOUNTER — Ambulatory Visit (INDEPENDENT_AMBULATORY_CARE_PROVIDER_SITE_OTHER): Payer: BC Managed Care – PPO | Admitting: Family Medicine

## 2011-04-19 DIAGNOSIS — E291 Testicular hypofunction: Secondary | ICD-10-CM

## 2011-04-19 NOTE — Progress Notes (Signed)
  Subjective:    Patient ID: Jonathan Oliver., male    DOB: Jun 26, 1965, 45 y.o.   MRN: [id number]  HPI Need for testosterone inj. Also c/o testicles soreness. Pt did have MRSA a few yrs ago there and had I & D so not sure if he pulled muscle or incision is sore. I suggested Pt see how he feels on Mon and call for apt if no better.     Review of Systems     Objective:   Physical Exam        Assessment & Plan:

## 2011-05-02 ENCOUNTER — Telehealth: Payer: Self-pay | Admitting: *Deleted

## 2011-05-02 MED ORDER — TADALAFIL 20 MG PO TABS
20.0000 mg | ORAL_TABLET | Freq: Every day | ORAL | Status: DC | PRN
Start: 2011-05-02 — End: 2011-06-01

## 2011-05-02 NOTE — Telephone Encounter (Signed)
rx sent

## 2011-05-02 NOTE — Telephone Encounter (Signed)
Pt has been using samples of Viagra when we have them. Pt would like Rx for Cialis bc it is the cheapest. Please send.

## 2011-05-03 ENCOUNTER — Ambulatory Visit (INDEPENDENT_AMBULATORY_CARE_PROVIDER_SITE_OTHER): Payer: BC Managed Care – PPO | Admitting: Family Medicine

## 2011-05-03 VITALS — BP 156/95 | HR 58

## 2011-05-03 DIAGNOSIS — E291 Testicular hypofunction: Secondary | ICD-10-CM

## 2011-05-03 DIAGNOSIS — E349 Endocrine disorder, unspecified: Secondary | ICD-10-CM

## 2011-05-03 MED ORDER — TESTOSTERONE CYPIONATE 200 MG/ML IM SOLN: 200.0000 mg | Freq: Once | INTRAMUSCULAR | Status: DC

## 2011-05-03 NOTE — Progress Notes (Signed)
  Subjective:    Patient ID: Jonathan Deprey Jr., male    DOB: 09/27/1965, 44 y.o.   MRN: [id number] Testosterone injection HPI    Review of Systems     Objective:   Physical Exam        Assessment & Plan:   

## 2011-05-14 ENCOUNTER — Ambulatory Visit (INDEPENDENT_AMBULATORY_CARE_PROVIDER_SITE_OTHER): Payer: BC Managed Care – PPO | Admitting: Family Medicine

## 2011-05-14 VITALS — BP 154/102 | HR 87

## 2011-05-14 DIAGNOSIS — E349 Endocrine disorder, unspecified: Secondary | ICD-10-CM

## 2011-05-14 DIAGNOSIS — E291 Testicular hypofunction: Secondary | ICD-10-CM

## 2011-05-14 MED ORDER — TESTOSTERONE CYPIONATE 200 MG/ML IM SOLN
200.0000 mg | Freq: Once | INTRAMUSCULAR | Status: AC
  Administered 2011-05-14: 200 mg via INTRAMUSCULAR

## 2011-05-14 NOTE — Progress Notes (Signed)
  Subjective:    Patient ID: Jonathan Oliver., male    DOB: [birth year], 45 y.o.   MRN: [id number] Testosterone injection HPI    Review of Systems     Objective:   Physical Exam        Assessment & Plan:

## 2011-05-31 ENCOUNTER — Ambulatory Visit (INDEPENDENT_AMBULATORY_CARE_PROVIDER_SITE_OTHER): Payer: BC Managed Care – PPO | Admitting: Family Medicine

## 2011-05-31 DIAGNOSIS — E538 Deficiency of other specified B group vitamins: Secondary | ICD-10-CM

## 2011-05-31 DIAGNOSIS — E291 Testicular hypofunction: Secondary | ICD-10-CM

## 2011-05-31 NOTE — Progress Notes (Signed)
  Subjective:    Patient ID: Jonathan Spring., male    DOB: Jul 14, 1965, 45 y.o.   MRN: [id number]  HPI Testosterone and B12 inj administered. BP is elevated today so Pt scheduled f/u apt for BP next Friday w/ MD    Review of Systems     Objective:   Physical Exam        Assessment & Plan:

## 2011-06-07 ENCOUNTER — Ambulatory Visit: Payer: BC Managed Care – PPO | Admitting: Family Medicine

## 2011-06-12 ENCOUNTER — Encounter: Payer: Self-pay | Admitting: Family Medicine

## 2011-06-14 ENCOUNTER — Ambulatory Visit (INDEPENDENT_AMBULATORY_CARE_PROVIDER_SITE_OTHER): Payer: BC Managed Care – PPO | Admitting: Family Medicine

## 2011-06-14 VITALS — BP 165/105 | HR 85

## 2011-06-14 DIAGNOSIS — E349 Endocrine disorder, unspecified: Secondary | ICD-10-CM

## 2011-06-14 DIAGNOSIS — E291 Testicular hypofunction: Secondary | ICD-10-CM

## 2011-06-14 NOTE — Progress Notes (Signed)
Patient ID: Jonathan Meader., male   DOB: [birth year], 45 y.o.   MRN: [id number] Testosterone inj given

## 2011-06-19 ENCOUNTER — Ambulatory Visit (INDEPENDENT_AMBULATORY_CARE_PROVIDER_SITE_OTHER): Payer: BC Managed Care – PPO | Admitting: Family Medicine

## 2011-06-19 ENCOUNTER — Encounter: Payer: Self-pay | Admitting: Family Medicine

## 2011-06-19 VITALS — BP 141/89 | HR 91 | Wt 249.0 lb

## 2011-06-19 DIAGNOSIS — Z23 Encounter for immunization: Secondary | ICD-10-CM

## 2011-06-19 DIAGNOSIS — E291 Testicular hypofunction: Secondary | ICD-10-CM

## 2011-06-19 DIAGNOSIS — I10 Essential (primary) hypertension: Secondary | ICD-10-CM

## 2011-06-19 DIAGNOSIS — N529 Male erectile dysfunction, unspecified: Secondary | ICD-10-CM

## 2011-06-19 MED ORDER — SILDENAFIL CITRATE 100 MG PO TABS: 100.0000 mg | ORAL_TABLET | ORAL | Status: DC | PRN

## 2011-06-19 MED ORDER — LISINOPRIL 40 MG PO TABS: 40.0000 mg | ORAL_TABLET | Freq: Every day | ORAL | Status: DC

## 2011-06-19 NOTE — Patient Instructions (Addendum)
Work on decreasing your caffeine intake and stop eating junk foods.   Recheck Blood pressure in 2 weeks on your lisinopril BP med.     1.5 Gram Low Sodium Diet A 1.5 gram sodium diet restricts the amount of sodium in the diet to no more than 1.5 g or 1500 mg daily. The American Heart Association recommends Americans over the age of 59 to consume no more than 1500 mg of sodium each day to reduce the risk of developing high blood pressure. Research also shows that limiting sodium may reduce heart attack and stroke risk. Many foods contain sodium for flavor and sometimes as a preservative. When the amount of sodium in a diet needs to be low, it is important to know what to look for when choosing foods and drinks. The following includes some information and guidelines to help make it easier for you to adapt to a low sodium diet. QUICK TIPS  Do not add salt to food.     Avoid convenience items and fast food.     Choose unsalted snack foods.     Buy lower sodium products, often labeled as "lower sodium" or "no salt added."     Check food labels to learn how much sodium is in 1 serving.     When eating at a restaurant, ask that your food be prepared with less salt or none, if possible.  READING FOOD LABELS FOR SODIUM INFORMATION The nutrition facts label is a good place to find how much sodium is in foods. Look for products with no more than 400 mg of sodium per serving. Remember that 1.5 g = 1500 mg. The food label may also list foods as:  Sodium-free: Less than 5 mg in a serving.     Very low sodium: 35 mg or less in a serving.     Low-sodium: 140 mg or less in a serving.     Light in sodium: 50% less sodium in a serving. For example, if a food that usually has 300 mg of sodium is changed to become light in sodium, it will have 150 mg of sodium.     Reduced sodium: 25% less sodium in a serving. For example, if a food that usually has 400 mg of sodium is changed to reduced sodium, it will  have 300 mg of sodium.  CHOOSING FOODS Grains  Avoid: Salted crackers and snack items. Some cereals, including instant hot cereals. Bread stuffing and biscuit mixes. Seasoned rice or pasta mixes.     Choose: Unsalted snack items. Low-sodium cereals, oats, puffed wheat and rice, shredded wheat. English muffins and bread. Pasta.  Meats  Avoid: Salted, canned, smoked, spiced, pickled meats, including fish and poultry. Bacon, ham, sausage, cold cuts, hot dogs, anchovies.     Choose: Low-sodium canned tuna and salmon. Fresh or frozen meat, poultry, and fish.  Dairy  Avoid: Processed cheese and spreads. Cottage cheese. Buttermilk and condensed milk. Regular cheese.     Choose: Milk. Low-sodium cottage cheese. Yogurt. Sour cream. Low-sodium cheese.  Fruits and Vegetables  Avoid: Regular canned vegetables. Regular canned tomato sauce and paste. Frozen vegetables in sauces. Olives. Rosita Fire. Relishes. Sauerkraut.     Choose: Low-sodium canned vegetables. Low-sodium tomato sauce and paste. Frozen or fresh vegetables. Fresh and frozen fruit.  Condiments  Avoid: Canned and packaged gravies. Worcestershire sauce. Tartar sauce. Barbecue sauce. Soy sauce. Steak sauce. Ketchup. Onion, garlic, and table salt. Meat flavorings and tenderizers.     Choose: Fresh and dried  herbs and spices. Low-sodium varieties of mustard and ketchup. Lemon juice. Tabasco sauce. Horseradish.  SAMPLE 1.5 GRAM SODIUM MEAL PLAN Breakfast / Sodium (mg)  1 cup low-fat milk / 143 mg     1 whole-wheat English muffin / 240 mg     1 tbs heart-healthy margarine / 153 mg     1 hard-boiled egg / 139 mg     1 small orange / 0 mg  Lunch / Sodium (mg)  1 cup raw carrots / 76 mg     2 tbs no salt added peanut butter / 5 mg     2 slices whole-wheat bread / 270 mg     1 tbs jelly / 6 mg      cup red grapes / 2 mg  Dinner / Sodium (mg)  1 cup whole-wheat pasta / 2 mg     1 cup low-sodium tomato sauce / 73 mg     3 oz  lean ground beef / 57 mg     1 small side salad (1 cup raw spinach leaves,  cup cucumber,  cup yellow bell pepper) with 1 tsp olive oil and 1 tsp red wine vinegar / 25 mg  Snack / Sodium (mg)  1 container low-fat vanilla yogurt / 107 mg     3 graham cracker squares / 127 mg  Nutrient Analysis  Calories: 1745     Protein: 75 g     Carbohydrate: 237 g     Fat: 57 g     Sodium: 1425 mg  Document Released: 06/10/2005 Document Revised: 02/20/2011 Document Reviewed: 09/11/2009 [facility] Patient Information 2012 [place], [place].

## 2011-06-19 NOTE — Progress Notes (Signed)
  Subjective:    Patient ID: Jonathan Spring., male    DOB: 19-Aug-1965, 45 y.o.   MRN: [id number]  Hypertension This is a chronic problem. The current episode started more than 1 year ago. The problem is uncontrolled. Pertinent negatives include no anxiety, chest pain or shortness of breath. There are no associated agents to hypertension. Past treatments include nothing. The current treatment provides no improvement. Compliance problems include medication cost.  Hypertensive end-organ damage includes PVD.  Started drinking mountain Dew about 2 months ago. Now drinking 4 a day.  Says not other changes in his diet. Doesn't really eat a low sat diet.   Hypogonadism. - Says ED improved initially but now about the same. Has noticed dec in abdominal fat and muscle strength. Denies any other side effects.    Review of Systems  Respiratory: Negative for shortness of breath.   Cardiovascular: Negative for chest pain.       Objective:   Physical Exam  Constitutional: He is oriented to person, place, and time. He appears well-developed and well-nourished.  HENT:  Head: Normocephalic and atraumatic.  Cardiovascular: Normal rate, regular rhythm and normal heart sounds.   Pulmonary/Chest: Effort normal and breath sounds normal.  Neurological: He is alert and oriented to person, place, and time.  Skin: Skin is warm and dry.  Psychiatric: He has a normal mood and affect. His behavior is normal.          Assessment & Plan:  HTN- Still elevated thought better today. We discussed working on dec caffeine intake and low salt diet. Recheck BP in 2 weeks. Azor was too expensive.  Start lisinopril 40mg  dialy. Discussed potential SE.    Hypogonadism - Due to recheck PSA, liver and test level.   ED - Would like rx for viagra. Found out cheaper on his insurance. Gve him some samples of viagra and levitra. New rx given.

## 2011-06-28 ENCOUNTER — Encounter: Payer: Self-pay | Admitting: Family Medicine

## 2011-06-28 ENCOUNTER — Ambulatory Visit (INDEPENDENT_AMBULATORY_CARE_PROVIDER_SITE_OTHER): Payer: BC Managed Care – PPO | Admitting: Family Medicine

## 2011-06-28 VITALS — BP 127/85 | HR 87 | Wt 235.0 lb

## 2011-06-28 DIAGNOSIS — I10 Essential (primary) hypertension: Secondary | ICD-10-CM

## 2011-06-28 DIAGNOSIS — D510 Vitamin B12 deficiency anemia due to intrinsic factor deficiency: Secondary | ICD-10-CM

## 2011-06-28 MED ORDER — CYANOCOBALAMIN 1000 MCG/ML IJ SOLN
1000.0000 ug | Freq: Once | INTRAMUSCULAR | Status: AC
  Administered 2011-06-28: 1000 ug via INTRAMUSCULAR

## 2011-06-28 NOTE — Progress Notes (Signed)
  Subjective:    Patient ID: Jonathan Spring., male    DOB: 1965/11/14, 46 y.o.   MRN: [id number]  Hypertension This is a chronic problem. The current episode started more than 1 year ago. The problem is controlled. Pertinent negatives include no blurred vision, chest pain or shortness of breath. Past treatments include ACE inhibitors. There are no compliance problems.     Not gout type flares. He did for his lab work today.   Review of Systems  Eyes: Negative for blurred vision.  Respiratory: Negative for shortness of breath.   Cardiovascular: Negative for chest pain.       Objective:   Physical Exam  Constitutional: He is oriented to person, place, and time. He appears well-developed and well-nourished.  HENT:  Head: Normocephalic and atraumatic.  Cardiovascular: Normal rate, regular rhythm and normal heart sounds.   Pulmonary/Chest: Effort normal and breath sounds normal.  Neurological: He is alert and oriented to person, place, and time.  Skin: Skin is warm and dry.  Psychiatric: He has a normal mood and affect. His behavior is normal.          Assessment & Plan:  HTN- At goal.  Went for labs today. F/U in 6 months. Call if any probles. Given slip to check CMP and lipids.   Vit B12 def - due for injection today.   Hypogonadism - We are out of test today. He will return next week for his injection.

## 2011-06-28 NOTE — Patient Instructions (Signed)
We will call you with your lab results. If you don't here from us in about a week then please give us a call at [phone].  

## 2011-06-29 LAB — HEPATIC FUNCTION PANEL
Alkaline Phosphatase: 68 U/L (ref 39–117)
Bilirubin, Direct: 0.1 mg/dL (ref 0.0–0.3)
Indirect Bilirubin: 0.3 mg/dL (ref 0.0–0.9)

## 2011-06-29 LAB — PSA: PSA: 0.56 ng/mL (ref <=4.00)

## 2011-07-01 LAB — TESTOSTERONE, FREE, TOTAL, SHBG
Sex Hormone Binding: 27 nmol/L (ref 13–71)
Testosterone, Free: 54.8 pg/mL (ref 47.0–244.0)
Testosterone-% Free: 2.2 % (ref 1.6–2.9)

## 2011-07-05 ENCOUNTER — Ambulatory Visit (INDEPENDENT_AMBULATORY_CARE_PROVIDER_SITE_OTHER): Payer: BC Managed Care – PPO | Admitting: Family Medicine

## 2011-07-05 VITALS — BP 140/96 | HR 106

## 2011-07-05 DIAGNOSIS — E291 Testicular hypofunction: Secondary | ICD-10-CM

## 2011-07-05 MED ORDER — TESTOSTERONE CYPIONATE 200 MG/ML IM SOLN
200.0000 mg | Freq: Once | INTRAMUSCULAR | Status: AC
  Administered 2011-07-05: 200 mg via INTRAMUSCULAR

## 2011-07-05 NOTE — Progress Notes (Signed)
  Subjective:    Patient ID: Jonathan Oliver., male    DOB: [birth year], 46 y.o.   MRN: [id number] Testosterone Injection HPI    Review of Systems     Objective:   Physical Exam        Assessment & Plan:

## 2011-07-06 LAB — COMPLETE METABOLIC PANEL WITH GFR
Albumin: 4.2 g/dL (ref 3.5–5.2)
Alkaline Phosphatase: 70 U/L (ref 39–117)
CO2: 24 mEq/L (ref 19–32)
Calcium: 9.6 mg/dL (ref 8.4–10.5)
GFR, Est African American: 70 mL/min
GFR, Est Non African American: 61 mL/min
Glucose, Bld: 89 mg/dL (ref 70–99)
Sodium: 139 mEq/L (ref 135–145)
Total Bilirubin: 0.8 mg/dL (ref 0.3–1.2)

## 2011-07-06 LAB — LIPID PANEL
Cholesterol: 173 mg/dL (ref 0–200)
LDL Cholesterol: 108 mg/dL — ABNORMAL HIGH (ref 0–99)
Total CHOL/HDL Ratio: 3.3 Ratio
Triglycerides: 65 mg/dL (ref <150)
VLDL: 13 mg/dL (ref 0–40)

## 2011-07-12 ENCOUNTER — Ambulatory Visit (INDEPENDENT_AMBULATORY_CARE_PROVIDER_SITE_OTHER): Payer: BC Managed Care – PPO | Admitting: Family Medicine

## 2011-07-12 VITALS — BP 146/88 | HR 98

## 2011-07-12 DIAGNOSIS — E291 Testicular hypofunction: Secondary | ICD-10-CM

## 2011-07-12 MED ORDER — TESTOSTERONE CYPIONATE 200 MG/ML IM SOLN
200.0000 mg | Freq: Once | INTRAMUSCULAR | Status: AC
  Administered 2011-07-12: 200 mg via INTRAMUSCULAR

## 2011-07-12 NOTE — Progress Notes (Signed)
  Subjective:    Patient ID: Jonathan Oliver., male    DOB: 1965/07/05, 46 y.o.   MRN: [id number] Testosterone injection; Pt states he is having dizziness and headaches, headaches are in different areas of his head HPI    Review of Systems     Objective:   Physical Exam        Assessment & Plan:

## 2011-07-26 ENCOUNTER — Ambulatory Visit (INDEPENDENT_AMBULATORY_CARE_PROVIDER_SITE_OTHER): Payer: BC Managed Care – PPO | Admitting: Family Medicine

## 2011-07-26 DIAGNOSIS — E291 Testicular hypofunction: Secondary | ICD-10-CM

## 2011-07-26 MED ORDER — TESTOSTERONE CYPIONATE 200 MG/ML IM SOLN
200.0000 mg | Freq: Once | INTRAMUSCULAR | Status: AC
  Administered 2011-07-26: 200 mg via INTRAMUSCULAR

## 2011-07-26 NOTE — Progress Notes (Signed)
  Subjective:    Patient ID: Jonathan Goldberg Jr., male    DOB: 12/31/1965, 45 y.o.   MRN: [id number]  HPI  Here for testosterone injection  Review of Systems     Objective:   Physical Exam        Assessment & Plan:   

## 2011-08-09 ENCOUNTER — Ambulatory Visit (INDEPENDENT_AMBULATORY_CARE_PROVIDER_SITE_OTHER): Payer: BC Managed Care – PPO | Admitting: Physician Assistant

## 2011-08-09 ENCOUNTER — Telehealth: Payer: Self-pay | Admitting: *Deleted

## 2011-08-09 VITALS — BP 146/83 | HR 63

## 2011-08-09 DIAGNOSIS — E291 Testicular hypofunction: Secondary | ICD-10-CM

## 2011-08-09 DIAGNOSIS — R5383 Other fatigue: Secondary | ICD-10-CM

## 2011-08-09 MED ORDER — TESTOSTERONE CYPIONATE 200 MG/ML IM SOLN
200.0000 mg | Freq: Once | INTRAMUSCULAR | Status: AC
  Administered 2011-08-09: 200 mg via INTRAMUSCULAR

## 2011-08-09 NOTE — Telephone Encounter (Signed)
Pt wanted you to know that he thinks the b12 injections are not working and also he wants to know if he can get the vials of testosterone

## 2011-08-09 NOTE — Progress Notes (Signed)
  Subjective:    Patient ID: Jonathan Down Jr., male    DOB: 03/15/1966, 45 y.o.   MRN: [id number]  HPI  Here for testosterone injection  Review of Systems     Objective:   Physical Exam        Assessment & Plan:   

## 2011-08-10 NOTE — Telephone Encounter (Signed)
We need to check his B12 to make sure it is normal. It hasn't been checked in 6 months so I can't comment on the "working or not working". As far as testosterone, what does he mean get the vials?  Usually we admin in the office. We can recheck his levels for test at the same time since he has been coming every 2 weeks and see if looks better.

## 2011-08-12 NOTE — Telephone Encounter (Signed)
L/m for pt to return call

## 2011-08-12 NOTE — Telephone Encounter (Signed)
Pt notified and will come in next week for labs

## 2011-08-22 ENCOUNTER — Other Ambulatory Visit: Payer: Self-pay | Admitting: Family Medicine

## 2011-08-22 ENCOUNTER — Ambulatory Visit (INDEPENDENT_AMBULATORY_CARE_PROVIDER_SITE_OTHER): Payer: BC Managed Care – PPO | Admitting: Family Medicine

## 2011-08-22 VITALS — BP 170/94 | HR 99

## 2011-08-22 DIAGNOSIS — E291 Testicular hypofunction: Secondary | ICD-10-CM

## 2011-08-22 MED ORDER — TESTOSTERONE CYPIONATE 200 MG/ML IM SOLN
200.0000 mg | Freq: Once | INTRAMUSCULAR | Status: AC
  Administered 2011-08-22: 200 mg via INTRAMUSCULAR

## 2011-08-22 NOTE — Progress Notes (Signed)
  Subjective:    Patient ID: Jonathan Whitwell Jr., male    DOB: 06/08/1966, 45 y.o.   MRN: [id number] Testosterone injection HPI    Review of Systems     Objective:   Physical Exam        Assessment & Plan:   

## 2011-08-23 ENCOUNTER — Ambulatory Visit: Payer: BC Managed Care – PPO

## 2011-08-23 LAB — TESTOSTERONE, FREE, TOTAL, SHBG
Sex Hormone Binding: 23 nmol/L (ref 13–71)
Testosterone, Free: 40.9 pg/mL — ABNORMAL LOW (ref 47.0–244.0)
Testosterone: 178.35 ng/dL — ABNORMAL LOW (ref 250–890)

## 2011-08-30 ENCOUNTER — Ambulatory Visit (INDEPENDENT_AMBULATORY_CARE_PROVIDER_SITE_OTHER): Payer: BC Managed Care – PPO | Admitting: Family Medicine

## 2011-08-30 VITALS — BP 154/102 | HR 85

## 2011-08-30 DIAGNOSIS — E291 Testicular hypofunction: Secondary | ICD-10-CM

## 2011-08-30 MED ORDER — TESTOSTERONE CYPIONATE 200 MG/ML IM SOLN
200.0000 mg | Freq: Once | INTRAMUSCULAR | Status: AC
  Administered 2011-08-30: 200 mg via INTRAMUSCULAR

## 2011-08-30 NOTE — Progress Notes (Signed)
  Subjective:    Patient ID: Jonathan Oliver., male    DOB: 1965/08/18, 46 y.o.   MRN: [id number] Testosterone injection; informed pt to recheck BP in a couple days. HPI    Review of Systems     Objective:   Physical Exam        Assessment & Plan:

## 2011-09-06 ENCOUNTER — Ambulatory Visit (INDEPENDENT_AMBULATORY_CARE_PROVIDER_SITE_OTHER): Payer: BC Managed Care – PPO | Admitting: Family Medicine

## 2011-09-06 ENCOUNTER — Ambulatory Visit: Payer: BC Managed Care – PPO

## 2011-09-06 VITALS — BP 152/98

## 2011-09-06 DIAGNOSIS — E291 Testicular hypofunction: Secondary | ICD-10-CM

## 2011-09-06 MED ORDER — TESTOSTERONE CYPIONATE 200 MG/ML IM SOLN
200.0000 mg | Freq: Once | INTRAMUSCULAR | Status: AC
  Administered 2011-09-06: 200 mg via INTRAMUSCULAR

## 2011-09-06 MED ORDER — METOPROLOL SUCCINATE ER 50 MG PO TB24: 50.0000 mg | ORAL_TABLET | Freq: Every day | ORAL | Status: DC

## 2011-09-06 NOTE — Progress Notes (Signed)
  Subjective:    Patient ID: Jonathan Oliver., male    DOB: 1966/03/25, 46 y.o.   MRN: [id number] Testosterone inj: pt's BP elevated again this week and has scheduled appt with provider to f/u HPI    Review of Systems     Objective:   Physical Exam        Assessment & Plan:  HTN- Still elevated. Looked great in January but high last 3 weeks.  wil add metoprolol. F/U in one week.

## 2011-09-06 NOTE — Progress Notes (Signed)
Pt informed

## 2011-09-13 ENCOUNTER — Ambulatory Visit (INDEPENDENT_AMBULATORY_CARE_PROVIDER_SITE_OTHER): Payer: BC Managed Care – PPO | Admitting: Family Medicine

## 2011-09-13 ENCOUNTER — Encounter: Payer: Self-pay | Admitting: Family Medicine

## 2011-09-13 VITALS — BP 136/90 | HR 76 | Ht 78.0 in | Wt 262.0 lb

## 2011-09-13 DIAGNOSIS — I10 Essential (primary) hypertension: Secondary | ICD-10-CM

## 2011-09-13 DIAGNOSIS — E291 Testicular hypofunction: Secondary | ICD-10-CM

## 2011-09-13 MED ORDER — TESTOSTERONE CYPIONATE 200 MG/ML IM SOLN
200.0000 mg | Freq: Once | INTRAMUSCULAR | Status: AC
  Administered 2011-09-13: 200 mg via INTRAMUSCULAR

## 2011-09-13 NOTE — Progress Notes (Signed)
  Subjective:    Patient ID: Jonathan Oliver., male    DOB: 26-Dec-1965, 46 y.o.   MRN: [id number]  HPI BP  - last 3-4 times has been here BP has been high for his testosterone shots. We added metoprolol 6 days ago. He is dong well on it. Was tired the first couple of day. No CP or SOB.   Testosterone injection given.     Review of Systems     Objective:   Physical Exam  Constitutional: He is oriented to person, place, and time. He appears well-developed and well-nourished.  HENT:  Head: Normocephalic and atraumatic.  Cardiovascular: Normal rate, regular rhythm and normal heart sounds.   Pulmonary/Chest: Effort normal and breath sounds normal.  Neurological: He is alert and oriented to person, place, and time.  Skin: Skin is warm and dry.  Psychiatric: He has a normal mood and affect. His behavior is normal.          Assessment & Plan:  Hypertension-his blood pressure is much better today with the addition of metoprolol. His diastolic is still high though. We'll give it one more week. Continue on low-salt diet and continue exercise. He started working out 4 days per week. He does have about a 13 pound weight increase since his office visit in January. If his blood pressure supine 1 week and we can either increase his metoprolol or add back amlodipine which he has done well on in the past. We need to  avoid diuretics because of his gout.

## 2011-09-13 NOTE — Patient Instructions (Signed)

## 2011-09-25 ENCOUNTER — Encounter: Payer: Self-pay | Admitting: Physician Assistant

## 2011-09-25 ENCOUNTER — Ambulatory Visit: Payer: BC Managed Care – PPO

## 2011-09-25 ENCOUNTER — Ambulatory Visit (INDEPENDENT_AMBULATORY_CARE_PROVIDER_SITE_OTHER): Payer: BC Managed Care – PPO | Admitting: Physician Assistant

## 2011-09-25 ENCOUNTER — Ambulatory Visit
Admission: RE | Admit: 2011-09-25 | Discharge: 2011-09-25 | Disposition: A | Discharge status: Home / Self Care | Payer: BC Managed Care – PPO | Source: Ambulatory Visit | Attending: Physician Assistant | Admitting: Physician Assistant

## 2011-09-25 VITALS — BP 112/73 | HR 76 | Ht 78.0 in | Wt 260.0 lb

## 2011-09-25 DIAGNOSIS — M25521 Pain in right elbow: Secondary | ICD-10-CM

## 2011-09-25 DIAGNOSIS — E291 Testicular hypofunction: Secondary | ICD-10-CM

## 2011-09-25 DIAGNOSIS — M25529 Pain in unspecified elbow: Secondary | ICD-10-CM

## 2011-09-25 MED ORDER — TESTOSTERONE CYPIONATE 200 MG/ML IM SOLN
200.0000 mg | INTRAMUSCULAR | Status: DC
  Administered 2011-09-25: 200 mg via INTRAMUSCULAR

## 2011-09-25 NOTE — Progress Notes (Signed)
  Subjective:    Patient ID: Jonathan Spring., male    DOB: 1965-10-23, 46 y.o.   MRN: [id number]  HPI Patient presents to the clinic with a knot on right forearm right below the elbow that has been present for 1 week. He noticed it after lifting weights a week ago. There is some pain but he has been able to continue to work out and infact has felt stronger. He does not remember any trauma to this area and has never had anything like this before. He has not tried anything to make better and it has stayed the same size since 1 week ago. He denies any numbness or tingling of the right arm, it is not tender to touch, and he has not lost any strength.    Review of Systems     Objective:   Physical Exam  Constitutional: He appears well-developed and well-nourished.  Musculoskeletal:       Normal ROM of right arm. 5/5 strength. No tenderness with palpation over 2 hardened knots. one below right elbow about the size of a walnut and the below that a knot the size of a lima bean. No swelling or redness around the area noted.           Assessment & Plan:  Right elbow pain with knot on right forearm- X-ray revealed no bony abnormalities. Continue using Advil up to 800mg  three times day and ice frequently. If not resolved in the next 3 weeks then we can get ultrasound.

## 2011-09-25 NOTE — Patient Instructions (Signed)
Will get x-ray and call with results. Continue using Advil up to 800mg  three times day and ice frequently. If not resolved in the next 3 weeks then we can get ultrasound.

## 2011-10-04 ENCOUNTER — Ambulatory Visit (INDEPENDENT_AMBULATORY_CARE_PROVIDER_SITE_OTHER): Payer: BC Managed Care – PPO | Admitting: Physician Assistant

## 2011-10-04 ENCOUNTER — Other Ambulatory Visit: Payer: Self-pay | Admitting: *Deleted

## 2011-10-04 VITALS — BP 165/93 | HR 69

## 2011-10-04 DIAGNOSIS — E291 Testicular hypofunction: Secondary | ICD-10-CM

## 2011-10-04 MED ORDER — TESTOSTERONE CYPIONATE 200 MG/ML IM SOLN
200.0000 mg | Freq: Once | INTRAMUSCULAR | Status: AC
  Administered 2011-10-04: 200 mg via INTRAMUSCULAR

## 2011-10-04 MED ORDER — METOPROLOL SUCCINATE ER 50 MG PO TB24: 50.0000 mg | ORAL_TABLET | Freq: Every day | ORAL | Status: DC

## 2011-10-04 MED ORDER — LISINOPRIL 40 MG PO TABS: 40.0000 mg | ORAL_TABLET | Freq: Every day | ORAL | Status: DC

## 2011-10-04 NOTE — Progress Notes (Signed)
  Subjective:    Patient ID: Jonathan Oliver., male    DOB: [birth year], 46 y.o.   MRN: [id number] Testosterone injection; BP elevated. Pt states he ran out of BP med while out of town. HPI    Review of Systems     Objective:   Physical Exam        Assessment & Plan:  Refill blood pressure medications.

## 2011-10-11 ENCOUNTER — Ambulatory Visit (INDEPENDENT_AMBULATORY_CARE_PROVIDER_SITE_OTHER): Payer: BC Managed Care – PPO | Admitting: Family Medicine

## 2011-10-11 DIAGNOSIS — E291 Testicular hypofunction: Secondary | ICD-10-CM

## 2011-10-11 MED ORDER — TESTOSTERONE CYPIONATE 200 MG/ML IM SOLN
400.0000 mg | INTRAMUSCULAR | Status: DC
  Administered 2011-10-11 – 2012-01-17 (×2): 400 mg via INTRAMUSCULAR

## 2011-10-11 NOTE — Progress Notes (Signed)
Patient ID: Jonathan Oliver., male   DOB: [birth year], 46 y.o.   MRN: [id number] Testosterone inj given. We will try 2ml (400mg ) q 2 weeks.

## 2011-10-25 ENCOUNTER — Ambulatory Visit (INDEPENDENT_AMBULATORY_CARE_PROVIDER_SITE_OTHER): Payer: BC Managed Care – PPO | Admitting: Family Medicine

## 2011-10-25 VITALS — BP 140/92 | HR 96

## 2011-10-25 DIAGNOSIS — E291 Testicular hypofunction: Secondary | ICD-10-CM

## 2011-10-25 DIAGNOSIS — E349 Endocrine disorder, unspecified: Secondary | ICD-10-CM

## 2011-10-25 MED ORDER — TESTOSTERONE CYPIONATE 200 MG/ML IM SOLN
400.0000 mg | INTRAMUSCULAR | Status: DC
  Administered 2011-10-25 – 2012-02-14 (×6): 400 mg via INTRAMUSCULAR

## 2011-10-25 NOTE — Progress Notes (Signed)
  Subjective:    Patient ID: Jonathan Spring., male    DOB: [birth year], 46 y.o.   MRN: [id number] Testosterone injection HPI    Review of Systems     Objective:   Physical Exam        Assessment & Plan:  Here for injection.

## 2011-11-08 ENCOUNTER — Ambulatory Visit (INDEPENDENT_AMBULATORY_CARE_PROVIDER_SITE_OTHER): Payer: BC Managed Care – PPO | Admitting: Family Medicine

## 2011-11-08 VITALS — BP 134/84 | HR 70

## 2011-11-08 DIAGNOSIS — E349 Endocrine disorder, unspecified: Secondary | ICD-10-CM

## 2011-11-08 DIAGNOSIS — E291 Testicular hypofunction: Secondary | ICD-10-CM

## 2011-11-08 NOTE — Progress Notes (Signed)
Patient ID: Jonathan Hoffman Jr., male   DOB: 02/22/1966, 45 y.o.   MRN: [id number] Testosterone inj given  

## 2011-11-22 ENCOUNTER — Ambulatory Visit (INDEPENDENT_AMBULATORY_CARE_PROVIDER_SITE_OTHER): Payer: BC Managed Care – PPO | Admitting: Family Medicine

## 2011-11-22 VITALS — BP 158/93 | HR 87

## 2011-11-22 DIAGNOSIS — E291 Testicular hypofunction: Secondary | ICD-10-CM

## 2011-11-22 MED ORDER — TESTOSTERONE CYPIONATE 200 MG/ML IM SOLN
400.0000 mg | Freq: Once | INTRAMUSCULAR | Status: AC
  Administered 2011-11-22: 400 mg via INTRAMUSCULAR

## 2011-11-22 NOTE — Progress Notes (Signed)
  Subjective:    Patient ID: Jonathan Fiorella Jr., male    DOB: 11/13/1965, 45 y.o.   MRN: [id number] Testosterone injection HPI    Review of Systems     Objective:   Physical Exam        Assessment & Plan:   

## 2011-12-06 ENCOUNTER — Ambulatory Visit (INDEPENDENT_AMBULATORY_CARE_PROVIDER_SITE_OTHER): Payer: BC Managed Care – PPO | Admitting: Family Medicine

## 2011-12-06 VITALS — BP 142/90 | HR 84

## 2011-12-06 DIAGNOSIS — E349 Endocrine disorder, unspecified: Secondary | ICD-10-CM

## 2011-12-06 DIAGNOSIS — E291 Testicular hypofunction: Secondary | ICD-10-CM

## 2011-12-06 NOTE — Progress Notes (Signed)
Patient ID: Jonathan Towry., male   DOB: 1965/12/14, 46 y.o.   MRN: [id number] Testosterone inj

## 2011-12-20 ENCOUNTER — Ambulatory Visit (INDEPENDENT_AMBULATORY_CARE_PROVIDER_SITE_OTHER): Payer: BC Managed Care – PPO | Admitting: Physician Assistant

## 2011-12-20 ENCOUNTER — Ambulatory Visit: Payer: BC Managed Care – PPO | Admitting: Physician Assistant

## 2011-12-20 ENCOUNTER — Ambulatory Visit: Payer: BC Managed Care – PPO

## 2011-12-20 DIAGNOSIS — E291 Testicular hypofunction: Secondary | ICD-10-CM

## 2011-12-20 MED ORDER — TESTOSTERONE CYPIONATE 200 MG/ML IM SOLN
400.0000 mg | Freq: Once | INTRAMUSCULAR | Status: AC
  Administered 2011-12-20: 400 mg via INTRAMUSCULAR

## 2011-12-20 NOTE — Progress Notes (Signed)
  Subjective:    Patient ID: Jonathan Spring., male    DOB: 1966/03/07, 46 y.o.   MRN: [id number]  HPI  Here for testosterone injection  Review of Systems     Objective:   Physical Exam        Assessment & Plan:

## 2012-01-03 ENCOUNTER — Ambulatory Visit (INDEPENDENT_AMBULATORY_CARE_PROVIDER_SITE_OTHER): Payer: BC Managed Care – PPO | Admitting: Family Medicine

## 2012-01-03 ENCOUNTER — Ambulatory Visit: Payer: BC Managed Care – PPO | Admitting: Family Medicine

## 2012-01-03 ENCOUNTER — Ambulatory Visit: Payer: BC Managed Care – PPO

## 2012-01-03 VITALS — BP 140/90 | HR 85

## 2012-01-03 DIAGNOSIS — E349 Endocrine disorder, unspecified: Secondary | ICD-10-CM

## 2012-01-03 DIAGNOSIS — E291 Testicular hypofunction: Secondary | ICD-10-CM

## 2012-01-03 NOTE — Progress Notes (Signed)
Patient ID: Jonathan Oliver., male   DOB: 1966-01-21, 46 y.o.   MRN: [id number] Test inj given

## 2012-01-17 ENCOUNTER — Ambulatory Visit (INDEPENDENT_AMBULATORY_CARE_PROVIDER_SITE_OTHER): Payer: BC Managed Care – PPO | Admitting: Family Medicine

## 2012-01-17 VITALS — BP 163/89

## 2012-01-17 DIAGNOSIS — E291 Testicular hypofunction: Secondary | ICD-10-CM

## 2012-01-17 MED ORDER — AMLODIPINE BESYLATE 5 MG PO TABS: 5.0000 mg | ORAL_TABLET | Freq: Every day | ORAL | Status: DC

## 2012-01-17 NOTE — Progress Notes (Signed)
  Subjective:    Patient ID: Jonathan Oliver., male    DOB: Jan 11, 1966, 46 y.o.   MRN: [id number]  HPI Here for testosterone injection. Blood pressures but he is asymptomatic.   Review of Systems     Objective:   Physical Exam        Assessment & Plan:  Hypertension-blood pressure was elevated while patient was here for testosterone injection today. While the back is not medical the last couple times he has been here. We will restart his amlodipine which she has tolerated well in the past at 5 mg. Followup with me in 3-4 weeks.

## 2012-01-18 LAB — HEPATIC FUNCTION PANEL
ALT: 41 U/L (ref 0–53)
Albumin: 4.1 g/dL (ref 3.5–5.2)
Indirect Bilirubin: 0.4 mg/dL (ref 0.0–0.9)
Total Protein: 7.1 g/dL (ref 6.0–8.3)

## 2012-01-31 ENCOUNTER — Encounter: Payer: Self-pay | Admitting: Family Medicine

## 2012-01-31 ENCOUNTER — Ambulatory Visit (INDEPENDENT_AMBULATORY_CARE_PROVIDER_SITE_OTHER): Payer: BC Managed Care – PPO | Admitting: Family Medicine

## 2012-01-31 ENCOUNTER — Ambulatory Visit (INDEPENDENT_AMBULATORY_CARE_PROVIDER_SITE_OTHER): Payer: BC Managed Care – PPO | Admitting: Sports Medicine

## 2012-01-31 VITALS — BP 145/94 | HR 96 | Ht 78.0 in | Wt 265.0 lb

## 2012-01-31 DIAGNOSIS — R2230 Localized swelling, mass and lump, unspecified upper limb: Secondary | ICD-10-CM | POA: Insufficient documentation

## 2012-01-31 DIAGNOSIS — M546 Pain in thoracic spine: Secondary | ICD-10-CM

## 2012-01-31 DIAGNOSIS — R29898 Other symptoms and signs involving the musculoskeletal system: Secondary | ICD-10-CM

## 2012-01-31 DIAGNOSIS — M25529 Pain in unspecified elbow: Secondary | ICD-10-CM

## 2012-01-31 DIAGNOSIS — E291 Testicular hypofunction: Secondary | ICD-10-CM

## 2012-01-31 DIAGNOSIS — M25521 Pain in right elbow: Secondary | ICD-10-CM

## 2012-01-31 DIAGNOSIS — I10 Essential (primary) hypertension: Secondary | ICD-10-CM

## 2012-01-31 HISTORY — DX: Localized swelling, mass and lump, unspecified upper limb: R22.30

## 2012-01-31 MED ORDER — MELOXICAM 15 MG PO TABS: ORAL_TABLET | ORAL | Status: DC

## 2012-01-31 MED ORDER — METOPROLOL SUCCINATE ER 100 MG PO TB24: 100.0000 mg | ORAL_TABLET | Freq: Every day | ORAL | Status: DC

## 2012-01-31 NOTE — Progress Notes (Signed)
Subjective:    Patient ID: Jonathan Spring., male    DOB: March 05, 1966, 46 y.o.   MRN: [id number]  HPI Here to followup on blood pressure. He's been mostly coming in for testosterone injections and for the last 5 visits over the last 3 months he has had high blood pressures. No chest pain or shortness of breath.  He also complains of problems with his right elbow. He had surgery approximately 4 years ago for what sounds like the bursa removal. He had a problem where it first instructed to track up into the tendon to his triceps and cause some problems. He has noticed over the last couple years the he's developed some lumps of what he feels like is most likely scar tissue. Most of the time it doesn't bother him. Occasionally he feels a little bit of soreness when he first starts his workout or if he accidentally places although up on the window or something like that he will feel sharp shooting pain. But otherwise it doesn't seem to bother him much during the day.  He also noted some mid back pain that occurs only at night for the last 3 months. He says sometimes will even wake him up at 3 in the morning. He says it doesn't matter if he sleeps on his right side or left side it seems to happen. They feel is flat on his neck or on his stomach he seems to get some relief. It does not bother him during the daytime at all. He wakes up with this feeling almost every morning. He describes it as feeling like everything is bunched up close together. No burning or stinging. No numbness or tingling. He says it's right over his mid back over his spine.  Review of Systems     Objective:   Physical Exam  Constitutional: He is oriented to person, place, and time. He appears well-developed and well-nourished.  HENT:  Head: Normocephalic and atraumatic.  Cardiovascular: Normal rate, regular rhythm and normal heart sounds.   Pulmonary/Chest: Effort normal and breath sounds normal.  Musculoskeletal:       He also  has a small firm nodule along the lateral portion of the right elbow and a larger nodule in the medial portion of the right elbow  Neurological: He is alert and oriented to person, place, and time.  Skin: Skin is warm and dry.  Psychiatric: He has a normal mood and affect. His behavior is normal.          Assessment & Plan:  HTN - not well controlled. Increase metoprolol 200 mg daily. Pulse is up today so can certainly tolerate a small dry. Call if any problems with the medication. Followup for repeat blood pressure check in 2 weeks when he comes in for his next testosterone injection. He also has a history of polycythemia vera and has not given blood in 3-4 months. He did not followup with hematologist. This may also be the cause of his recent elevation in his blood pressure. I encouraged him to at least try going to the local [employer] [facility] to donate. Also gave him a handout polycythemia vera.  Hypogonadism-due for testosterone today. Testosterone and PSA labs are up-to-date. Lab Results  Component Value Date   TESTOSTERONE 887.11 01/17/2012    Lab Results  Component Value Date   PSA 0.67 01/17/2012   PSA 0.56 06/28/2011   PSA 0.48 11/15/2010   Mid back pain-I recommend starting with an x-ray. There may be something positional  about his sleep and that's causing pain and spasm and possibly causing some nerve compression that he's not having any radiating symptoms. Will start with x-ray.  Right elbow pain-unclear what the nodules are. They may be possible scar tissue from the actual surgery itself. I would like him to see my partner Dr. Benjamin Stain for further evaluation with possible ultrasound.

## 2012-01-31 NOTE — Progress Notes (Signed)
Patient ID: Jonathan Oliver., male   DOB: 1965-11-05, 46 y.o.   MRN: [id number] Subjective:    I'm seeing this patient as a consultation for:  Dr. Linford Arnold  CC: Right elbow mass.  HPI: Jonathan Oliver is a very pleasant, healthy 46 year old male who comes in with a several month history of a slowly enlarging mass that localizes on the medial aspect of his right elbow. He does have a history of olecranon bursectomy many years ago. Unfortunately, over the last few months he has developed increasing mildly tender mass medial to this region. He gets sore particularly after workouts, and occasionally when he rests the elbow on it. The pain does not radiate, and he denies any numbness or tingling in his fingers. He continues to have full range of motion, and full function of his hand, wrist and elbow.  Past medical history, Surgical history, Family history, Social history, Allergies, and medications have been entered into the medical record, reviewed, and no changes needed.   Review of Systems: No headache, visual changes, nausea, vomiting, diarrhea, constipation, dizziness, abdominal pain, skin rash, fevers, chills, night sweats, weight loss, body aches, joint swelling, muscle aches, chest pain, or shortness of breath.   Objective:    General: Well Developed, well nourished, and in no acute distress.  Neuro: Alert and oriented x3, extra-ocular muscles intact.  Skin: Warm and dry, no rashes noted.  Respiratory: Not using accessory muscles, speaking in full sentences.  Cardiovascular: Pulses palpable, no extremity edema. Right Elbow: Unremarkable to inspection. Range of motion full pronation, supination, flexion, extension. Strength is full to all of the above directions Stable to varus, valgus stress. Negative moving valgus stress test. There are several cystic type masses, approximately 1 cm across, movable, and well-defined, and nontender on various parts of his elbow. The largest structure however, is a  regular, movable, soft, and mildly tender to palpation. This is located just posterior to the medial epicondyle, and feels to be in close approximation/continuous with the ulnar nerve. Ulnar nerve does not sublux. Negative cubital tunnel Tinel's.  Procedure: Limited ultrasound of right elbow. Device: GE logiq E. Findings: The multiple small, 1 cm cystic structures are not visible on ultrasound. The larger structure, appears heterogenous, and just posterior to the medial epicondyles. The ulnar nerve is visible, and clear, but loses its definition in the vicinity of the mass.  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  This may also represent scar tissue from his previous olecranon bursectomy. I do recommend MRI for further evaluation.  Images permanently stored in the unit and are available for review.

## 2012-01-31 NOTE — Assessment & Plan Note (Addendum)
Unclear etiology at this point. It is in close approximation with the ulnar nerve, and may represent an ulnar neuroma versus scar tissue from his olecranon bursectomy. Slowly growing over the past 6-7 months. Meloxicam, icing, and compression. Also MRI his right elbow with IV contrast.

## 2012-01-31 NOTE — Patient Instructions (Signed)
Compression, Icing. MRI. Meloxicam. Come back to see me to go over MRI.

## 2012-01-31 NOTE — Patient Instructions (Addendum)
Polycythemia Vera  Polycythemia Vera is a condition in which the body makes too many red blood cells and there is no known cause. The red blood cells (erythrocytes) are the cells which carry the oxygen in your blood stream to the cells of your body. Because of the increased red blood cells, the blood becomes thicker and does not circulate as well. It would be similar to your car having oil which is too thick so it cannot start and circulate as well. When the blood is too thick it often causes headaches and dizziness. It may also cause blood clots. Even though the blood clots easier, these patients bleed easier. The bleeding is caused because the blood cells which help stop bleeding (platelets) do not function normally. It occurs in all age groups but is more common in the 50 to 70 year age range. TREATMENT  The treatment of polycythemia vera for many years has been blood removal (phlebotomy) which is similar to blood removal in a blood bank, however this blood is not used for donation. Hydroxyurea is used to supplement phlebotomy. Aspirin is commonly given to thin the blood as long as the patient does not have a problem with bleeding. Other drugs are used based on the progression of the disease. Document Released: 03/05/2001 Document Revised: 05/30/2011 Document Reviewed: 09/09/2008 [employer] Patient Information 2012 [employer].  

## 2012-02-03 ENCOUNTER — Telehealth: Payer: Self-pay | Admitting: *Deleted

## 2012-02-03 DIAGNOSIS — R2230 Localized swelling, mass and lump, unspecified upper limb: Secondary | ICD-10-CM

## 2012-02-04 ENCOUNTER — Telehealth: Payer: Self-pay | Admitting: *Deleted

## 2012-02-04 DIAGNOSIS — I10 Essential (primary) hypertension: Secondary | ICD-10-CM

## 2012-02-04 NOTE — Telephone Encounter (Signed)
MRI elbow w/wo doesn't require PA.

## 2012-02-05 LAB — BASIC METABOLIC PANEL
CO2: 27 mEq/L (ref 19–32)
Calcium: 9.3 mg/dL (ref 8.4–10.5)
Potassium: 5.3 mEq/L (ref 3.5–5.3)
Sodium: 139 mEq/L (ref 135–145)

## 2012-02-05 LAB — BASIC METABOLIC PANEL WITH GFR
BUN: 13 mg/dL (ref 6–23)
Chloride: 105 meq/L (ref 96–112)
Creat: 1.74 mg/dL — ABNORMAL HIGH (ref 0.50–1.35)
Glucose, Bld: 168 mg/dL — ABNORMAL HIGH (ref 70–99)

## 2012-02-06 ENCOUNTER — Telehealth: Payer: Self-pay | Admitting: Sports Medicine

## 2012-02-06 DIAGNOSIS — R7989 Other specified abnormal findings of blood chemistry: Secondary | ICD-10-CM

## 2012-02-06 HISTORY — DX: Other specified abnormal findings of blood chemistry: R79.89

## 2012-02-06 NOTE — Assessment & Plan Note (Signed)
This is too elevated at this point to do a contrast MRI that we need of his elbow. I would like him to stop all creatine supplementation if he is on it, and hydrate with 8-10 glasses of water per day. I would then like to check another basic metabolic panel and if normalized, we can proceed with the MRI with contrast. If remains elevated, we will need to start the workup for renal insufficiency.

## 2012-02-06 NOTE — Telephone Encounter (Signed)
Hi Misty, His creatinine is too high for a contrast MRI, lets postpone the MRI and have him hydrate (>8-10 glasses h20 daily) and stop all creatine if he is on it.  Then recheck BMET in 1-2 weeks and if creat normalizes, we can do the contrast MRI. Thanks! I'll place the order for the bmet.

## 2012-02-06 NOTE — Telephone Encounter (Signed)
LMOM for pt with instructions. I have also called Jonathan Oliver and cancelled the MRI. Informed Jonathan Oliver we will call her when we can reschedule the MRI.

## 2012-02-14 ENCOUNTER — Other Ambulatory Visit: Payer: Self-pay | Admitting: *Deleted

## 2012-02-14 ENCOUNTER — Ambulatory Visit (INDEPENDENT_AMBULATORY_CARE_PROVIDER_SITE_OTHER): Payer: BC Managed Care – PPO | Admitting: Family Medicine

## 2012-02-14 VITALS — BP 158/100 | HR 100

## 2012-02-14 DIAGNOSIS — E291 Testicular hypofunction: Secondary | ICD-10-CM

## 2012-02-14 DIAGNOSIS — R7989 Other specified abnormal findings of blood chemistry: Secondary | ICD-10-CM

## 2012-02-14 MED ORDER — HYDROCHLOROTHIAZIDE 25 MG PO TABS: 25.0000 mg | ORAL_TABLET | Freq: Every day | ORAL | Status: DC

## 2012-02-14 MED ORDER — TESTOSTERONE CYPIONATE 200 MG/ML IM SOLN: 200.0000 mg | Freq: Once | INTRAMUSCULAR | Status: DC

## 2012-02-14 NOTE — Progress Notes (Signed)
  Subjective:    Patient ID: Jonathan Oliver., male    DOB: 1966/04/11, 46 y.o.   MRN: [id number]  HPI   Here for testoserone injection. BP  Is elevated today Review of Systems     Objective:   Physical Exam        Assessment & Plan:  HTN - Not well controlle.d Will add HCTZ to his regimen for BP F/U for BP check in 2- 3 weeks.

## 2012-02-15 ENCOUNTER — Ambulatory Visit (HOSPITAL_BASED_OUTPATIENT_CLINIC_OR_DEPARTMENT_OTHER): Payer: BC Managed Care – PPO

## 2012-02-15 LAB — BASIC METABOLIC PANEL
CO2: 30 mEq/L (ref 19–32)
Chloride: 104 mEq/L (ref 96–112)
Creat: 1.63 mg/dL — ABNORMAL HIGH (ref 0.50–1.35)

## 2012-02-15 LAB — BASIC METABOLIC PANEL WITH GFR
BUN: 14 mg/dL (ref 6–23)
Calcium: 9.3 mg/dL (ref 8.4–10.5)
Glucose, Bld: 101 mg/dL — ABNORMAL HIGH (ref 70–99)
Potassium: 4.6 meq/L (ref 3.5–5.3)
Sodium: 138 meq/L (ref 135–145)

## 2012-02-16 NOTE — Telephone Encounter (Signed)
Jonathan Oliver, his creatinine still high, so lets just get MRI of his elbow without contrast.   Creat is trending in the right direction so would defer CKD workup for now, would recheck in a few months.

## 2012-02-18 NOTE — Progress Notes (Signed)
LM for patient to call back.  Will let him know that he can call [facility] to schedule his MRI.  The order is already in the system

## 2012-02-19 NOTE — Progress Notes (Signed)
LM informing pt that new med add been sent to [facility] and to make a NV for BP check in 2-3 weeks.

## 2012-02-19 NOTE — Telephone Encounter (Signed)
Jonathan Oliver @ [facility] will call pt to schedule MRI elbow w/o contrast.

## 2012-02-28 ENCOUNTER — Ambulatory Visit (INDEPENDENT_AMBULATORY_CARE_PROVIDER_SITE_OTHER): Payer: BC Managed Care – PPO | Admitting: Family Medicine

## 2012-02-28 DIAGNOSIS — E291 Testicular hypofunction: Secondary | ICD-10-CM

## 2012-02-28 MED ORDER — TESTOSTERONE CYPIONATE 200 MG/ML IM SOLN
400.0000 mg | INTRAMUSCULAR | Status: DC
  Administered 2012-02-28: 400 mg via INTRAMUSCULAR

## 2012-02-28 NOTE — Progress Notes (Signed)
Patient ID: Jonathan Oliver., male   DOB: Sep 30, 1965, 46 y.o.   MRN: [id number] Testosterone inj given

## 2012-02-29 ENCOUNTER — Ambulatory Visit (HOSPITAL_BASED_OUTPATIENT_CLINIC_OR_DEPARTMENT_OTHER)
Admission: RE | Admit: 2012-02-29 | Discharge: 2012-02-29 | Disposition: A | Discharge status: Home / Self Care | Payer: BC Managed Care – PPO | Source: Ambulatory Visit | Attending: Sports Medicine | Admitting: Sports Medicine

## 2012-02-29 DIAGNOSIS — M6580 Other synovitis and tenosynovitis, unspecified site: Secondary | ICD-10-CM | POA: Insufficient documentation

## 2012-02-29 DIAGNOSIS — R2230 Localized swelling, mass and lump, unspecified upper limb: Secondary | ICD-10-CM

## 2012-02-29 DIAGNOSIS — R937 Abnormal findings on diagnostic imaging of other parts of musculoskeletal system: Secondary | ICD-10-CM | POA: Insufficient documentation

## 2012-02-29 DIAGNOSIS — R29898 Other symptoms and signs involving the musculoskeletal system: Secondary | ICD-10-CM | POA: Insufficient documentation

## 2012-03-02 ENCOUNTER — Telehealth: Payer: Self-pay | Admitting: Sports Medicine

## 2012-03-02 NOTE — Telephone Encounter (Signed)
Pt scheduled appt for 9/20 at 3:45pm to go over MRI results.

## 2012-03-02 NOTE — Telephone Encounter (Signed)
Please call Jonathan Oliver and let him know the MRI showed that that elbow mass was predominantly scar tissue. He can come see me to go over images, should this be a source of discomfort, otherwise he can ignore it.

## 2012-03-13 ENCOUNTER — Ambulatory Visit (INDEPENDENT_AMBULATORY_CARE_PROVIDER_SITE_OTHER): Payer: BC Managed Care – PPO | Admitting: Sports Medicine

## 2012-03-13 ENCOUNTER — Ambulatory Visit: Payer: BC Managed Care – PPO

## 2012-03-13 ENCOUNTER — Encounter: Payer: Self-pay | Admitting: Sports Medicine

## 2012-03-13 VITALS — BP 170/95 | HR 66 | Wt 263.0 lb

## 2012-03-13 DIAGNOSIS — R29898 Other symptoms and signs involving the musculoskeletal system: Secondary | ICD-10-CM

## 2012-03-13 DIAGNOSIS — E291 Testicular hypofunction: Secondary | ICD-10-CM

## 2012-03-13 DIAGNOSIS — M25511 Pain in right shoulder: Secondary | ICD-10-CM | POA: Insufficient documentation

## 2012-03-13 DIAGNOSIS — E349 Endocrine disorder, unspecified: Secondary | ICD-10-CM

## 2012-03-13 DIAGNOSIS — R2230 Localized swelling, mass and lump, unspecified upper limb: Secondary | ICD-10-CM

## 2012-03-13 DIAGNOSIS — M25519 Pain in unspecified shoulder: Secondary | ICD-10-CM

## 2012-03-13 DIAGNOSIS — I10 Essential (primary) hypertension: Secondary | ICD-10-CM

## 2012-03-13 MED ORDER — MELOXICAM 15 MG PO TABS: ORAL_TABLET | ORAL | Status: DC

## 2012-03-13 MED ORDER — LISINOPRIL 40 MG PO TABS: 40.0000 mg | ORAL_TABLET | Freq: Every day | ORAL | Status: DC

## 2012-03-13 MED ORDER — TESTOSTERONE CYPIONATE 200 MG/ML IM SOLN
400.0000 mg | Freq: Once | INTRAMUSCULAR | Status: AC
  Administered 2012-03-13: 400 mg via INTRAMUSCULAR

## 2012-03-13 MED ORDER — HYDROCHLOROTHIAZIDE 25 MG PO TABS: 25.0000 mg | ORAL_TABLET | Freq: Every day | ORAL | Status: DC

## 2012-03-13 NOTE — Assessment & Plan Note (Signed)
Refilling lisinopril.

## 2012-03-13 NOTE — Assessment & Plan Note (Addendum)
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 I think it's his rotator cuff, this was pinched as his arm was held in extreme abduction for a long period of time during his MRI. He will do the sleeper stretch as I did notice some posterior capsular tightness, as well as rotator cuff rehabilitation. We'll see him back in 3 weeks, and if no better will do an injection into the subacromial bursa.

## 2012-03-13 NOTE — Assessment & Plan Note (Signed)
MRI did show scar tissue. It's painful with pressure, I do suspect this is because it overlies the ulnar nerve. I'm going to refer him to [facility] to consider excision. They will likely need his MRI results, and scan. I will see him back on an as needed basis for this.

## 2012-03-13 NOTE — Progress Notes (Signed)
Subjective:    CC: Followup left elbow mass, discussed new right shoulder pain  HPI:  Left elbow mass: Jonathan Oliver had an olecranon bursectomy years ago. Since then he has had a slowly growing nodule he localizes just proximal to the medial condyle. This nodule is not painful itself, however it's painful to press on. He also has a similar structure just proximal to the lateral epicondyle. I obtained an MRI as I was concerned that this may be an ulnar neuroma. MRI showed that it was merely scar tissue and fibrosis. This is persistently painful for him and he desires excision.   Right shoulder: Pains localized from mid trapezius, over the acromion, to just proximal to lateral elbow. It does not radiate below the elbow, or to his fingertips. It is described as a dull type aching pain. It's worse with any type of overhead movement.  He notes that this occurred after having his arm strapped in extreme abduction for an extended period of time during his MRI.  He also needs refills on his blood pressure medicine, and is also here for a testosterone shot for male hypogonadism.   Past medical history, Surgical history, Family history, Social history, Allergies, and medications have been entered into the medical record, reviewed, and no changes needed.   Review of Systems: No fevers, chills, night sweats, weight loss, chest pain, or shortness of breath.   Objective:    General: Well Developed, well nourished, and in no acute distress.  Neuro: Alert and oriented x3, extra-ocular muscles intact.  HEENT: Normocephalic, atraumatic, pupils equal round reactive to light, neck supple, no masses, no lymphadenopathy, thyroid nonpalpable.  Skin: Warm and dry, no rashes. Cardiac: Regular rate and rhythm, no murmurs rubs or gallops.  Respiratory: Clear to auscultation bilaterally. Not using accessory muscles, speaking in full sentences. Right Shoulder: Inspection reveals no abnormalities, atrophy or  asymmetry. Palpation is normal with no tenderness over AC joint or bicipital groove. ROM is full in all planes. Rotator cuff strength weak to abduction Positive Empty Can test, negative Hawkins, negative Neer sign. Speeds and Yergason's tests normal. No labral pathology noted with negative Obrien's, negative clunk and good stability. Normal scapular function observed. No painful arc and no drop arm sign. No apprehension sign He does have a glenohumeral internal rotation deficit on the right side, he also has very impressive external rotation.   The nodules are still palpable medially on his elbow.    I did review the MRI myself, shows essentially scar tissue.  Impression and Recommendations:

## 2012-03-13 NOTE — Assessment & Plan Note (Signed)
Testosterone Injection given today.

## 2012-03-27 ENCOUNTER — Ambulatory Visit (INDEPENDENT_AMBULATORY_CARE_PROVIDER_SITE_OTHER): Payer: BC Managed Care – PPO | Admitting: Family Medicine

## 2012-03-27 VITALS — BP 142/93 | HR 106

## 2012-03-27 DIAGNOSIS — E291 Testicular hypofunction: Secondary | ICD-10-CM

## 2012-03-27 MED ORDER — TESTOSTERONE CYPIONATE 200 MG/ML IM SOLN
400.0000 mg | Freq: Once | INTRAMUSCULAR | Status: AC
  Administered 2012-03-27: 400 mg via INTRAMUSCULAR

## 2012-03-27 NOTE — Progress Notes (Signed)
  Subjective:    Patient ID: Jonathan Alix Jr., male    DOB: 10/23/1965, 45 y.o.   MRN: [id number] Testosterone injection HPI    Review of Systems     Objective:   Physical Exam        Assessment & Plan:   

## 2012-04-03 ENCOUNTER — Ambulatory Visit: Payer: BC Managed Care – PPO | Admitting: Sports Medicine

## 2012-04-10 ENCOUNTER — Ambulatory Visit (INDEPENDENT_AMBULATORY_CARE_PROVIDER_SITE_OTHER): Payer: BC Managed Care – PPO | Admitting: Family Medicine

## 2012-04-10 VITALS — BP 118/74 | HR 84

## 2012-04-10 DIAGNOSIS — E291 Testicular hypofunction: Secondary | ICD-10-CM

## 2012-04-10 MED ORDER — TESTOSTERONE CYPIONATE 200 MG/ML IM SOLN
400.0000 mg | Freq: Once | INTRAMUSCULAR | Status: AC
  Administered 2012-04-10: 400 mg via INTRAMUSCULAR

## 2012-04-10 NOTE — Progress Notes (Signed)
  Subjective:    Patient ID: Jonathan Spring., male    DOB: 29-Oct-1965, 46 y.o.   MRN: [id number]  HPI Test injec given.    Review of Systems     Objective:   Physical Exam        Assessment & Plan:

## 2012-04-17 ENCOUNTER — Ambulatory Visit (INDEPENDENT_AMBULATORY_CARE_PROVIDER_SITE_OTHER): Payer: BC Managed Care – PPO | Admitting: Sports Medicine

## 2012-04-17 ENCOUNTER — Encounter: Payer: Self-pay | Admitting: Sports Medicine

## 2012-04-17 VITALS — BP 133/89 | HR 95 | Temp 98.2°F | Wt 260.0 lb

## 2012-04-17 DIAGNOSIS — J0110 Acute frontal sinusitis, unspecified: Secondary | ICD-10-CM

## 2012-04-17 MED ORDER — FLUTICASONE PROPIONATE 50 MCG/ACT NA SUSP: NASAL | Status: DC

## 2012-04-17 MED ORDER — AMOXICILLIN-POT CLAVULANATE 875-125 MG PO TABS: 1.0000 | ORAL_TABLET | Freq: Two times a day (BID) | ORAL | Status: DC

## 2012-04-17 NOTE — Assessment & Plan Note (Signed)
Flonase, Augmentin x10 days, over-the-counter cold and flu medicines. Come back to see [place] on an as-needed basis.

## 2012-04-17 NOTE — Patient Instructions (Addendum)

## 2012-04-17 NOTE — Progress Notes (Addendum)
Subjective:    CC: Sick  HPI: Cough, mild sore throat, frontal sinus pressure and pain, purulent nasal discharge, fatigue, subjective fevers and chills for over 2 weeks now. Got better, then got worse consistent with a double sickening.  Pain did radiate to right ear. Unable to sleep due to coughing, and congestion. Has tried Mucinex, but no improvement. No sick contacts. No nausea, vomiting, diarrhea, constipation. No rashes.  Past medical history, Surgical history, Family history, Social history, Allergies, and medications have been entered into the medical record, reviewed, and no changes needed.   Review of Systems: No fevers, chills, night sweats, weight loss, chest pain, or shortness of breath.   Objective:    General: Well Developed, well nourished, and in no acute distress.  Neuro: Alert and oriented x3, extra-ocular muscles intact.  HEENT: Normocephalic, atraumatic, pupils equal round reactive to light, neck supple, no masses, no lymphadenopathy, thyroid nonpalpable. Ear canals, nasopharynx, oropharynx are unremarkable to inspection. Minimal tenderness to palpation over the frontal sinuses Skin: Warm and dry, no rashes. Cardiac: Regular rate and rhythm, no murmurs rubs or gallops.  Respiratory: Clear to auscultation bilaterally. Not using accessory muscles, speaking in full sentences.  Impression and Recommendations:

## 2012-04-24 ENCOUNTER — Ambulatory Visit: Payer: BC Managed Care – PPO

## 2012-05-01 ENCOUNTER — Ambulatory Visit (INDEPENDENT_AMBULATORY_CARE_PROVIDER_SITE_OTHER): Payer: BC Managed Care – PPO | Admitting: Family Medicine

## 2012-05-01 ENCOUNTER — Telehealth: Payer: Self-pay | Admitting: Family Medicine

## 2012-05-01 VITALS — BP 123/72 | HR 82

## 2012-05-01 DIAGNOSIS — E291 Testicular hypofunction: Secondary | ICD-10-CM

## 2012-05-01 MED ORDER — TESTOSTERONE CYPIONATE 200 MG/ML IM SOLN
400.0000 mg | Freq: Once | INTRAMUSCULAR | Status: AC
  Administered 2012-05-01: 400 mg via INTRAMUSCULAR

## 2012-05-01 NOTE — Telephone Encounter (Signed)
Sure, if it wasn't for the male thing I'd probable say no but if he is more comfortable then I'm ok with the switch.

## 2012-05-01 NOTE — Progress Notes (Signed)
  Subjective:    Patient ID: Jonathan Oliver., male    DOB: 01/21/1966, 46 y.o.   MRN: [id number] Testosterone injection HPI    Review of Systems     Objective:   Physical Exam        Assessment & Plan:

## 2012-05-01 NOTE — Telephone Encounter (Signed)
No problem is ok with Dr. Karie Schwalbe

## 2012-05-01 NOTE — Telephone Encounter (Signed)
Patient has requested that I change his pcp from Dr.Metheney to Dr.Thekkekandem now that we have male providers in the office. He states that he just feels more comfortable with a male Dr.. Please let me know if this is okay to do and I will change in the computer. Thanks, [employer]

## 2012-05-15 ENCOUNTER — Ambulatory Visit (INDEPENDENT_AMBULATORY_CARE_PROVIDER_SITE_OTHER): Payer: BC Managed Care – PPO | Admitting: Family Medicine

## 2012-05-15 DIAGNOSIS — E291 Testicular hypofunction: Secondary | ICD-10-CM

## 2012-05-15 DIAGNOSIS — E349 Endocrine disorder, unspecified: Secondary | ICD-10-CM

## 2012-05-15 MED ORDER — TESTOSTERONE CYPIONATE 200 MG/ML IM SOLN
200.0000 mg | Freq: Once | INTRAMUSCULAR | Status: AC
  Administered 2012-05-15: 200 mg via INTRAMUSCULAR

## 2012-05-15 NOTE — Progress Notes (Signed)
Patient here for Testosterone 200 mg injection right buttock

## 2012-05-29 ENCOUNTER — Ambulatory Visit (INDEPENDENT_AMBULATORY_CARE_PROVIDER_SITE_OTHER): Payer: BC Managed Care – PPO | Admitting: Sports Medicine

## 2012-05-29 VITALS — BP 150/103 | HR 95

## 2012-05-29 DIAGNOSIS — E291 Testicular hypofunction: Secondary | ICD-10-CM

## 2012-05-29 DIAGNOSIS — E349 Endocrine disorder, unspecified: Secondary | ICD-10-CM

## 2012-05-29 MED ORDER — TESTOSTERONE CYPIONATE 200 MG/ML IM SOLN
400.0000 mg | Freq: Once | INTRAMUSCULAR | Status: AC
  Administered 2012-05-29: 400 mg via INTRAMUSCULAR

## 2012-05-29 NOTE — Addendum Note (Signed)
Addended by: Monica Becton on: 05/29/2012 10:52 AM   Modules accepted: Orders

## 2012-05-29 NOTE — Progress Notes (Signed)
I was present for all essential parts of this visit and procedure. Recheck testosterone levels at his next visit.  Ihor Austin. Benjamin Stain, M.D.

## 2012-06-01 ENCOUNTER — Telehealth: Payer: Self-pay | Admitting: Family Medicine

## 2012-06-12 ENCOUNTER — Ambulatory Visit (INDEPENDENT_AMBULATORY_CARE_PROVIDER_SITE_OTHER): Payer: BC Managed Care – PPO | Admitting: Sports Medicine

## 2012-06-12 VITALS — BP 160/106 | HR 100

## 2012-06-12 DIAGNOSIS — E291 Testicular hypofunction: Secondary | ICD-10-CM

## 2012-06-12 MED ORDER — TESTOSTERONE CYPIONATE 200 MG/ML IM SOLN
400.0000 mg | Freq: Once | INTRAMUSCULAR | Status: AC
  Administered 2012-06-12: 400 mg via INTRAMUSCULAR

## 2012-06-12 NOTE — Progress Notes (Signed)
  Subjective:    Patient ID: Jonathan Oliver., male    DOB: 02/27/1966, 46 y.o.   MRN: [id number]  HPI  Here for a testosterone injection   Review of Systems     Objective:   Physical Exam        Assessment & Plan:  Pt will have labs drawn at the end of Jan.  I was present for all essential parts of this visit and procedure. Ihor Austin. Benjamin Stain, M.D.

## 2012-06-26 ENCOUNTER — Ambulatory Visit (INDEPENDENT_AMBULATORY_CARE_PROVIDER_SITE_OTHER): Payer: BC Managed Care – PPO | Admitting: Sports Medicine

## 2012-06-26 VITALS — BP 195/105 | HR 99

## 2012-06-26 DIAGNOSIS — E291 Testicular hypofunction: Secondary | ICD-10-CM

## 2012-06-26 MED ORDER — TESTOSTERONE CYPIONATE 200 MG/ML IM SOLN
400.0000 mg | Freq: Once | INTRAMUSCULAR | Status: AC
  Administered 2012-06-26: 400 mg via INTRAMUSCULAR

## 2012-06-26 NOTE — Progress Notes (Signed)
  Subjective:    Patient ID: Jonathan Spring., male    DOB: 10-01-1965, 47 y.o.   MRN: [id number] Testosterone injection HPI    Review of Systems     Objective:   Physical Exam        Assessment & Plan:  I was present for all essential parts of this visit and procedure. Ihor Austin. Benjamin Stain, M.D.

## 2012-06-29 LAB — TESTOSTERONE, FREE, TOTAL, SHBG
Sex Hormone Binding: 23 nmol/L (ref 13–71)
Testosterone, Free: 117.2 pg/mL (ref 47.0–244.0)
Testosterone-% Free: 2.5 % (ref 1.6–2.9)
Testosterone: 466.66 ng/dL (ref 300–890)

## 2012-07-10 ENCOUNTER — Ambulatory Visit (INDEPENDENT_AMBULATORY_CARE_PROVIDER_SITE_OTHER): Payer: BC Managed Care – PPO | Admitting: Sports Medicine

## 2012-07-10 DIAGNOSIS — E291 Testicular hypofunction: Secondary | ICD-10-CM

## 2012-07-10 DIAGNOSIS — E349 Endocrine disorder, unspecified: Secondary | ICD-10-CM

## 2012-07-10 MED ORDER — TESTOSTERONE CYPIONATE 200 MG/ML IM SOLN
400.0000 mg | Freq: Once | INTRAMUSCULAR | Status: AC
  Administered 2012-07-10: 400 mg via INTRAMUSCULAR

## 2012-07-10 NOTE — Progress Notes (Signed)
  Subjective:    Patient ID: Jonathan Rickles Jr., male    DOB: 08/23/1965, 46 y.o.   MRN: [id number] Testosterone injection HPI    Review of Systems     Objective:   Physical Exam        Assessment & Plan:  I was present for all essential parts of this visit and procedure. Thomas J. Thekkekandam, M.D. 

## 2012-07-24 ENCOUNTER — Ambulatory Visit (INDEPENDENT_AMBULATORY_CARE_PROVIDER_SITE_OTHER): Payer: BC Managed Care – PPO | Admitting: Sports Medicine

## 2012-07-24 VITALS — BP 142/97 | HR 92

## 2012-07-24 DIAGNOSIS — E291 Testicular hypofunction: Secondary | ICD-10-CM

## 2012-07-24 DIAGNOSIS — R2230 Localized swelling, mass and lump, unspecified upper limb: Secondary | ICD-10-CM

## 2012-07-24 DIAGNOSIS — R29898 Other symptoms and signs involving the musculoskeletal system: Secondary | ICD-10-CM

## 2012-07-24 DIAGNOSIS — E349 Endocrine disorder, unspecified: Secondary | ICD-10-CM

## 2012-07-24 MED ORDER — TESTOSTERONE CYPIONATE 200 MG/ML IM SOLN
400.0000 mg | Freq: Once | INTRAMUSCULAR | Status: AC
  Administered 2012-07-24: 400 mg via INTRAMUSCULAR

## 2012-07-24 MED ORDER — MELOXICAM 15 MG PO TABS: 15.0000 mg | ORAL_TABLET | Freq: Every day | ORAL | Status: DC

## 2012-07-24 MED ORDER — TESTOSTERONE CYPIONATE 200 MG/ML IM SOLN: 400.0000 mg | INTRAMUSCULAR | Status: DC

## 2012-07-24 NOTE — Progress Notes (Addendum)
Testosterone injection given today, 400 mg intramuscular. Patient desires to repeat injections at home. Prescription will be written for testosterone at home.  Patient was seen face-to-face, no exam was performed, 10 minutes were spent with the patient, greater than 50% of this was face-to-face time with counseling regarding male hypogonadism, elbow mass.

## 2012-07-24 NOTE — Assessment & Plan Note (Signed)
Scheduled for olecranon bursectomy in March. Needs refill on Mobic.

## 2012-07-24 NOTE — Assessment & Plan Note (Signed)
Prescription written for testosterone to be injected at home. Still needs repeat testosterone and CBC in 6 months.

## 2012-08-07 ENCOUNTER — Ambulatory Visit: Payer: BC Managed Care – PPO

## 2012-10-27 ENCOUNTER — Other Ambulatory Visit: Payer: Self-pay | Admitting: *Deleted

## 2012-10-27 DIAGNOSIS — I10 Essential (primary) hypertension: Secondary | ICD-10-CM

## 2012-10-27 MED ORDER — LISINOPRIL 40 MG PO TABS: 40.0000 mg | ORAL_TABLET | Freq: Every day | ORAL | Status: DC

## 2013-02-04 ENCOUNTER — Other Ambulatory Visit: Payer: Self-pay | Admitting: Sports Medicine

## 2013-02-04 ENCOUNTER — Other Ambulatory Visit: Payer: Self-pay

## 2013-02-04 DIAGNOSIS — E349 Endocrine disorder, unspecified: Secondary | ICD-10-CM

## 2013-02-04 MED ORDER — TESTOSTERONE CYPIONATE 200 MG/ML IM SOLN: 400.0000 mg | INTRAMUSCULAR | Status: DC

## 2013-02-19 ENCOUNTER — Ambulatory Visit: Payer: BC Managed Care – PPO | Admitting: Sports Medicine

## 2013-02-26 ENCOUNTER — Encounter: Payer: Self-pay | Admitting: Sports Medicine

## 2013-02-26 ENCOUNTER — Ambulatory Visit (INDEPENDENT_AMBULATORY_CARE_PROVIDER_SITE_OTHER): Payer: BC Managed Care – PPO | Admitting: Sports Medicine

## 2013-02-26 VITALS — BP 174/115 | HR 101 | Wt 263.0 lb

## 2013-02-26 DIAGNOSIS — M109 Gout, unspecified: Secondary | ICD-10-CM

## 2013-02-26 DIAGNOSIS — E291 Testicular hypofunction: Secondary | ICD-10-CM

## 2013-02-26 DIAGNOSIS — J387 Other diseases of larynx: Secondary | ICD-10-CM

## 2013-02-26 DIAGNOSIS — Z87442 Personal history of urinary calculi: Secondary | ICD-10-CM

## 2013-02-26 DIAGNOSIS — K219 Gastro-esophageal reflux disease without esophagitis: Secondary | ICD-10-CM | POA: Insufficient documentation

## 2013-02-26 DIAGNOSIS — E349 Endocrine disorder, unspecified: Secondary | ICD-10-CM

## 2013-02-26 DIAGNOSIS — I10 Essential (primary) hypertension: Secondary | ICD-10-CM

## 2013-02-26 HISTORY — DX: Gastro-esophageal reflux disease without esophagitis: K21.9

## 2013-02-26 LAB — COMPREHENSIVE METABOLIC PANEL
ALT: 32 U/L (ref 0–53)
AST: 31 U/L (ref 0–37)
CO2: 24 mEq/L (ref 19–32)
Calcium: 9.4 mg/dL (ref 8.4–10.5)
Chloride: 105 mEq/L (ref 96–112)
Creat: 1.65 mg/dL — ABNORMAL HIGH (ref 0.50–1.35)
Potassium: 5.1 mEq/L (ref 3.5–5.3)
Sodium: 139 mEq/L (ref 135–145)
Total Protein: 6.9 g/dL (ref 6.0–8.3)

## 2013-02-26 LAB — COMPREHENSIVE METABOLIC PANEL WITH GFR
Albumin: 3.9 g/dL (ref 3.5–5.2)
Alkaline Phosphatase: 56 U/L (ref 39–117)
BUN: 13 mg/dL (ref 6–23)
Glucose, Bld: 109 mg/dL — ABNORMAL HIGH (ref 70–99)
Total Bilirubin: 1.3 mg/dL — ABNORMAL HIGH (ref 0.3–1.2)

## 2013-02-26 LAB — CBC
HCT: 56.3 % — ABNORMAL HIGH (ref 39.0–52.0)
Hemoglobin: 19.3 g/dL — ABNORMAL HIGH (ref 13.0–17.0)
MCH: 27.8 pg (ref 26.0–34.0)
MCHC: 34.3 g/dL (ref 30.0–36.0)
MCV: 81 fL (ref 78.0–100.0)
Platelets: 225 K/uL (ref 150–400)
RBC: 6.95 MIL/uL — ABNORMAL HIGH (ref 4.22–5.81)
RDW: 16.6 % — ABNORMAL HIGH (ref 11.5–15.5)
WBC: 9.3 K/uL (ref 4.0–10.5)

## 2013-02-26 LAB — LIPID PANEL
Cholesterol: 177 mg/dL (ref 0–200)
HDL: 40 mg/dL (ref >39)
LDL Cholesterol: 123 mg/dL — ABNORMAL HIGH (ref 0–99)
Total CHOL/HDL Ratio: 4.4 Ratio
Triglycerides: 69 mg/dL (ref <150)
VLDL: 14 mg/dL (ref 0–40)

## 2013-02-26 MED ORDER — TESTOSTERONE CYPIONATE 200 MG/ML IM SOLN: 400.0000 mg | INTRAMUSCULAR | Status: DC

## 2013-02-26 MED ORDER — METOPROLOL SUCCINATE ER 100 MG PO TB24: 100.0000 mg | ORAL_TABLET | Freq: Every day | ORAL | Status: DC

## 2013-02-26 MED ORDER — LISINOPRIL-HYDROCHLOROTHIAZIDE 20-25 MG PO TABS: 1.0000 | ORAL_TABLET | Freq: Every day | ORAL | Status: DC

## 2013-02-26 MED ORDER — OMEPRAZOLE 40 MG PO CPDR: 40.0000 mg | DELAYED_RELEASE_CAPSULE | Freq: Every day | ORAL | Status: DC

## 2013-02-26 NOTE — Assessment & Plan Note (Signed)
Adding omeprazole at dinnertime.

## 2013-02-26 NOTE — Progress Notes (Signed)
  Subjective:    CC: CPE  HPI:  Hypertension: Uncontrolled, no headaches, visual changes, he was on metoprolol, amlodipine, lisinopril, and hydrochlorothiazide, at some point in the past these were discontinued, and he was only taking hydrochlorothiazide.  Hypogonadism: Needs a refill on testosterone for home injection.  Throat clearing: Wakes up with hoarseness, clears throat through the day. Has not been treated for acid reflux.  Past medical history, Surgical history, Family history not pertinant except as noted below, Social history, Allergies, and medications have been entered into the medical record, reviewed, and no changes needed.   Review of Systems: No headache, visual changes, nausea, vomiting, diarrhea, constipation, dizziness, abdominal pain, skin rash, fevers, chills, night sweats, swollen lymph nodes, weight loss, chest pain, body aches, joint swelling, muscle aches, shortness of breath, mood changes, visual or auditory hallucinations.  Objective:    General: Well Developed, well nourished, and in no acute distress.  Neuro: Alert and oriented x3, extra-ocular muscles intact, sensation grossly intact.  HEENT: Normocephalic, atraumatic, pupils equal round reactive to light, neck supple, no masses, no lymphadenopathy, thyroid nonpalpable.  Skin: Warm and dry, no rashes noted.  Cardiac: Regular rate and rhythm, no murmurs rubs or gallops.  Respiratory: Clear to auscultation bilaterally. Not using accessory muscles, speaking in full sentences.  Abdominal: Soft, nontender, nondistended, positive bowel sounds, no masses, no organomegaly.  Musculoskeletal: Shoulder, elbow, wrist, hip, knee, ankle stable, and with full range of motion. Impression and Recommendations:    The patient was counselled, risk factors were discussed, anticipatory guidance given.

## 2013-02-26 NOTE — Assessment & Plan Note (Signed)
Extremely elevated but no symptoms of end organ damage. Calling in lisinopril/hydrochlorothiazide, and metoprolol. I like to see him back every 2-3 weeks until his blood pressure is controlled. Checking blood work.

## 2013-02-26 NOTE — Assessment & Plan Note (Signed)
Refill testosterone and checking levels.

## 2013-03-01 LAB — TESTOSTERONE, FREE, TOTAL, SHBG
Sex Hormone Binding: 29 nmol/L (ref 13–71)
Testosterone, Free: 280.4 pg/mL — ABNORMAL HIGH (ref 47.0–244.0)
Testosterone-% Free: 2.6 % (ref 1.6–2.9)
Testosterone: 1070 ng/dL — ABNORMAL HIGH (ref 300–890)

## 2013-03-01 NOTE — Addendum Note (Signed)
Addended by: Monica Becton on: 03/01/2013 08:45 AM   Modules accepted: Orders

## 2013-03-19 ENCOUNTER — Ambulatory Visit: Payer: BC Managed Care – PPO | Admitting: Sports Medicine

## 2013-03-23 ENCOUNTER — Ambulatory Visit (INDEPENDENT_AMBULATORY_CARE_PROVIDER_SITE_OTHER): Payer: BC Managed Care – PPO | Admitting: Sports Medicine

## 2013-03-23 ENCOUNTER — Encounter: Payer: Self-pay | Admitting: Sports Medicine

## 2013-03-23 VITALS — BP 159/108 | HR 69 | Wt 254.0 lb

## 2013-03-23 DIAGNOSIS — E291 Testicular hypofunction: Secondary | ICD-10-CM

## 2013-03-23 DIAGNOSIS — J387 Other diseases of larynx: Secondary | ICD-10-CM

## 2013-03-23 DIAGNOSIS — E349 Endocrine disorder, unspecified: Secondary | ICD-10-CM

## 2013-03-23 DIAGNOSIS — K219 Gastro-esophageal reflux disease without esophagitis: Secondary | ICD-10-CM

## 2013-03-23 DIAGNOSIS — I10 Essential (primary) hypertension: Secondary | ICD-10-CM

## 2013-03-23 MED ORDER — METOPROLOL SUCCINATE ER 200 MG PO TB24: 200.0000 mg | ORAL_TABLET | Freq: Every day | ORAL | Status: DC

## 2013-03-23 NOTE — Assessment & Plan Note (Signed)
Resolved, continue omeprazole.

## 2013-03-23 NOTE — Patient Instructions (Addendum)
Recheck testosterone exactly one week after your second shot. Double your Toprol XL to 200 mg, once you run out fill the 200 mg pills.

## 2013-03-23 NOTE — Assessment & Plan Note (Signed)
Go back up to 2 mL injected to 2 weeks. Last time we checked his levels it was within one week after the last injection, and likely falsely elevated. He is feeling more fatigued since decreasing the dose.

## 2013-03-23 NOTE — Progress Notes (Signed)
  Subjective:    CC: Followup  HPI: Hypogonadism: Most recent testosterone was checked slightly early resulting in a high level. We did decrease his testosterone by 25%. Unfortunately he's feeling some increasing fatigue.  Hypertension: Improved since last visit, no further headaches or sweating. His mother has been checking it for him at home has been in the 120s over 80s.  Laryngopharyngeal reflux: Resolved with omeprazole.  Past medical history, Surgical history, Family history not pertinant except as noted below, Social history, Allergies, and medications have been entered into the medical record, reviewed, and no changes needed.   Review of Systems: No fevers, chills, night sweats, weight loss, chest pain, or shortness of breath.   Objective:    General: Well Developed, well nourished, and in no acute distress.  Neuro: Alert and oriented x3, extra-ocular muscles intact, sensation grossly intact.  HEENT: Normocephalic, atraumatic, pupils equal round reactive to light, neck supple, no masses, no lymphadenopathy, thyroid nonpalpable.  Skin: Warm and dry, no rashes. Cardiac: Regular rate and rhythm, no murmurs rubs or gallops, no lower extremity edema.  Respiratory: Clear to auscultation bilaterally. Not using accessory muscles, speaking in full sentences.  Impression and Recommendations:

## 2013-03-23 NOTE — Assessment & Plan Note (Signed)
Significantly improved since last visit. Readings at home have been 120/80. Increasing Toprol to 200 mg extended release. We will revisit this in a few weeks.

## 2013-04-23 ENCOUNTER — Ambulatory Visit (INDEPENDENT_AMBULATORY_CARE_PROVIDER_SITE_OTHER): Payer: BC Managed Care – PPO | Admitting: Sports Medicine

## 2013-04-23 ENCOUNTER — Encounter: Payer: Self-pay | Admitting: Sports Medicine

## 2013-04-23 VITALS — BP 147/88 | HR 52 | Wt 255.0 lb

## 2013-04-23 DIAGNOSIS — D751 Secondary polycythemia: Secondary | ICD-10-CM

## 2013-04-23 DIAGNOSIS — M7710 Lateral epicondylitis, unspecified elbow: Secondary | ICD-10-CM

## 2013-04-23 DIAGNOSIS — E291 Testicular hypofunction: Secondary | ICD-10-CM

## 2013-04-23 DIAGNOSIS — D45 Polycythemia vera: Secondary | ICD-10-CM

## 2013-04-23 DIAGNOSIS — M7711 Lateral epicondylitis, right elbow: Secondary | ICD-10-CM

## 2013-04-23 DIAGNOSIS — E349 Endocrine disorder, unspecified: Secondary | ICD-10-CM

## 2013-04-23 HISTORY — DX: Lateral epicondylitis, right elbow: M77.11

## 2013-04-23 HISTORY — DX: Secondary polycythemia: D75.1

## 2013-04-23 NOTE — Assessment & Plan Note (Signed)
Re-referral to hematology. He was controlled with frequent phlebotomy. I'm going to refer him back, last hemoglobin was 19, and blood pressure is elevated.

## 2013-04-23 NOTE — Patient Instructions (Signed)
Polycythemia Vera  Polycythemia Vera is a condition in which the body makes too many red blood cells and there is no known cause. The red blood cells (erythrocytes) are the cells which carry the oxygen in your blood stream to the cells of your body. Because of the increased red blood cells, the blood becomes thicker and does not circulate as well. It would be similar to your car having oil which is too thick so it cannot start and circulate as well. When the blood is too thick it often causes headaches and dizziness. It may also cause blood clots. Even though the blood clots easier, these patients bleed easier. The bleeding is caused because the blood cells which help stop bleeding (platelets) do not function normally. It occurs in all age groups but is more common in the 50 to 70 year age range. TREATMENT  The treatment of polycythemia vera for many years has been blood removal (phlebotomy) which is similar to blood removal in a blood bank, however this blood is not used for donation. Hydroxyurea is used to supplement phlebotomy. Aspirin is commonly given to thin the blood as long as the patient does not have a problem with bleeding. Other drugs are used based on the progression of the disease. Document Released: 03/05/2001 Document Revised: 09/02/2011 Document Reviewed: 09/09/2008 [employer] Patient Information 2014 [employer].  

## 2013-04-23 NOTE — Assessment & Plan Note (Signed)
Home rehabilitation. Return in one month, injection if no better.

## 2013-04-23 NOTE — Progress Notes (Signed)
  Subjective:    CC: Followup  HPI: Hypertension: Still elevated, one 200 mg of metoprolol, lisinopril/hydrochlorothiazide. 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 No headaches or visual changes, chest pain.  Right elbow pain: Localized at the common extensor tendon origin, worse with extension of the wrist, moderate, persistent.  Polycythemia: Last hemoglobin was 19, used to see a hematologist and have frequent phlebotomies. He has stopped seeing this physician.  Past medical history, Surgical history, Family history not pertinant except as noted below, Social history, Allergies, and medications have been entered into the medical record, reviewed, and no changes needed.   Review of Systems: No fevers, chills, night sweats, weight loss, chest pain, or shortness of breath.   Objective:    General: Well Developed, well nourished, and in no acute distress.  Neuro: Alert and oriented x3, extra-ocular muscles intact, sensation grossly intact.  HEENT: Normocephalic, atraumatic, pupils equal round reactive to light, neck supple, no masses, no lymphadenopathy, thyroid nonpalpable.  Skin: Warm and dry, no rashes. Cardiac: Regular rate and rhythm, no murmurs rubs or gallops, no lower extremity edema.  Respiratory: Clear to auscultation bilaterally. Not using accessory muscles, speaking in full sentences. Right Elbow: Unremarkable to inspection. Range of motion full pronation, supination, flexion, extension. Strength is full to all of the above directions Stable to varus, valgus stress. Negative moving valgus stress test. Tender to palpation at the origin of the common extensor tendon. Ulnar nerve does not sublux. Negative cubital tunnel Tinel's.  Impression and Recommendations:

## 2013-04-26 LAB — TESTOSTERONE, FREE, TOTAL, SHBG
Sex Hormone Binding: 32 nmol/L (ref 13–71)
Testosterone, Free: 38.1 pg/mL — ABNORMAL LOW (ref 47.0–244.0)
Testosterone-% Free: 1.9 % (ref 1.6–2.9)
Testosterone: 197 ng/dL — ABNORMAL LOW (ref 300–890)

## 2013-05-31 ENCOUNTER — Other Ambulatory Visit: Payer: Self-pay | Admitting: Sports Medicine

## 2013-05-31 MED ORDER — MELOXICAM 15 MG PO TABS: 15.0000 mg | ORAL_TABLET | Freq: Every day | ORAL | Status: DC

## 2013-06-07 ENCOUNTER — Other Ambulatory Visit: Payer: Self-pay | Admitting: Sports Medicine

## 2013-06-07 DIAGNOSIS — I10 Essential (primary) hypertension: Secondary | ICD-10-CM

## 2013-06-07 MED ORDER — METOPROLOL SUCCINATE ER 200 MG PO TB24: 200.0000 mg | ORAL_TABLET | Freq: Every day | ORAL | Status: DC

## 2013-06-14 ENCOUNTER — Telehealth: Payer: Self-pay

## 2013-06-14 MED ORDER — METOPROLOL TARTRATE 100 MG PO TABS: 100.0000 mg | ORAL_TABLET | Freq: Two times a day (BID) | ORAL | Status: DC

## 2013-06-14 NOTE — Telephone Encounter (Signed)
Patient called request another Rx similar to Metoprolol 200 mg he stated that when he went to pharmacy to pick it up the price had tripled on it. Please advise [facility] pharmacy

## 2013-06-14 NOTE — Telephone Encounter (Signed)
Switching to non-extended release metoprolol  100mg  2x a day.

## 2013-06-14 NOTE — Telephone Encounter (Signed)
Spoke to patient advised him that Metoprolol was sent to [facility]. Rhonda Cunningham,CMA

## 2013-06-15 ENCOUNTER — Encounter: Payer: Self-pay | Admitting: Sports Medicine

## 2013-07-09 ENCOUNTER — Ambulatory Visit (INDEPENDENT_AMBULATORY_CARE_PROVIDER_SITE_OTHER): Payer: BC Managed Care – PPO | Admitting: Sports Medicine

## 2013-07-09 ENCOUNTER — Encounter: Payer: Self-pay | Admitting: Sports Medicine

## 2013-07-09 VITALS — BP 160/107 | HR 69 | Wt 262.0 lb

## 2013-07-09 DIAGNOSIS — R059 Cough, unspecified: Secondary | ICD-10-CM

## 2013-07-09 DIAGNOSIS — J111 Influenza due to unidentified influenza virus with other respiratory manifestations: Secondary | ICD-10-CM

## 2013-07-09 DIAGNOSIS — R05 Cough: Secondary | ICD-10-CM

## 2013-07-09 DIAGNOSIS — R69 Illness, unspecified: Secondary | ICD-10-CM

## 2013-07-09 DIAGNOSIS — I10 Essential (primary) hypertension: Secondary | ICD-10-CM

## 2013-07-09 LAB — POCT INFLUENZA A/B: Influenza A, POC: NEGATIVE

## 2013-07-09 MED ORDER — FLUTICASONE PROPIONATE 50 MCG/ACT NA SUSP: NASAL | Status: DC

## 2013-07-09 MED ORDER — AMLODIPINE BESYLATE 10 MG PO TABS: ORAL_TABLET | ORAL | Status: DC

## 2013-07-09 MED ORDER — AZITHROMYCIN 250 MG PO TABS: ORAL_TABLET | ORAL | Status: DC

## 2013-07-09 MED ORDER — OSELTAMIVIR PHOSPHATE 75 MG PO CAPS: 75.0000 mg | ORAL_CAPSULE | Freq: Two times a day (BID) | ORAL | Status: DC

## 2013-07-09 NOTE — Progress Notes (Signed)
  Subjective:    CC: Sick  HPI: Jonathan Oliver comes in with a 1.5 days of muscle aches, bodyaches, headaches, fevers, chills, sore throat, coughing. He has had exposure to multiple sick contacts. He does have mild maxillary sinus pressure, and he wonders if he has influenza. Symptoms are moderate, persistent, no GI symptoms, no skin rashes.  Past medical history, Surgical history, Family history not pertinant except as noted below, Social history, Allergies, and medications have been entered into the medical record, reviewed, and no changes needed.   Review of Systems: No fevers, chills, night sweats, weight loss, chest pain, or shortness of breath.   Objective:    General: Well Developed, well nourished, and in no acute distress.  Neuro: Alert and oriented x3, extra-ocular muscles intact, sensation grossly intact.  HEENT: Normocephalic, atraumatic, pupils equal round reactive to light, neck supple, no masses, no lymphadenopathy, thyroid nonpalpable. Oropharynx, nasopharynx, external ear canals are unremarkable, mild tenderness to palpation over the maxillary and frontal sinuses. Skin: Warm and dry, no rashes. Cardiac: Regular rate and rhythm, no murmurs rubs or gallops, no lower extremity edema.  Respiratory: Clear to auscultation bilaterally. Not using accessory muscles, speaking in full sentences.  Impression and Recommendations:

## 2013-07-09 NOTE — Assessment & Plan Note (Signed)
Persistently elevated. Adding amlodipine max dose. Continue lisinopril/hydrochlorothiazide and metoprolol.

## 2013-07-09 NOTE — Assessment & Plan Note (Signed)
Azithromycin, Flonase, Tamiflu. Over-the-counter analgesics. Return as needed.

## 2013-07-30 ENCOUNTER — Encounter: Payer: Self-pay | Admitting: Sports Medicine

## 2013-07-30 ENCOUNTER — Ambulatory Visit (INDEPENDENT_AMBULATORY_CARE_PROVIDER_SITE_OTHER): Payer: BC Managed Care – PPO | Admitting: Sports Medicine

## 2013-07-30 VITALS — BP 133/86 | HR 75 | Ht 77.0 in | Wt 270.0 lb

## 2013-07-30 DIAGNOSIS — M109 Gout, unspecified: Secondary | ICD-10-CM

## 2013-07-30 DIAGNOSIS — I10 Essential (primary) hypertension: Secondary | ICD-10-CM

## 2013-07-30 DIAGNOSIS — R69 Illness, unspecified: Secondary | ICD-10-CM

## 2013-07-30 DIAGNOSIS — J111 Influenza due to unidentified influenza virus with other respiratory manifestations: Secondary | ICD-10-CM

## 2013-07-30 MED ORDER — METOPROLOL TARTRATE 100 MG PO TABS: 200.0000 mg | ORAL_TABLET | Freq: Two times a day (BID) | ORAL | Status: DC

## 2013-07-30 MED ORDER — PROPYLHEXEDRINE NA INHA: 1.0000 | Freq: Three times a day (TID) | NASAL | Status: DC | PRN

## 2013-07-30 NOTE — Assessment & Plan Note (Signed)
Podagra flare. Patient declines interventional treatment. Celebrex was requested, I have given him some samples.

## 2013-07-30 NOTE — Assessment & Plan Note (Signed)
No further antibiotics needed. Simply has some nasal discharge. He will continue Flonase, I added an as needed nasal decongestant.

## 2013-07-30 NOTE — Assessment & Plan Note (Signed)
Improve, increasing metoprolol to 200 mg twice a day.

## 2013-07-30 NOTE — Progress Notes (Signed)
  Subjective:    CC:  Followup  HPI: Hypertension: Well controlled. Currently using amlodipine at max dose, lisinopril/hydrochlorothiazide, and metoprolol.  Rhinorrhea: Improved with Flonase. Still has some watery nasal discharge.   Gout: Currently having a flare in the big toe, declines any form of intervention, requests Celebrex.  Past medical history, Surgical history, Family history not pertinant except as noted below, Social history, Allergies, and medications have been entered into the medical record, reviewed, and no changes needed.   Review of Systems: No fevers, chills, night sweats, weight loss, chest pain, or shortness of breath.   Objective:    General: Well Developed, well nourished, and in no acute distress.  Neuro: Alert and oriented x3, extra-ocular muscles intact, sensation grossly intact.  HEENT: Normocephalic, atraumatic, pupils equal round reactive to light, neck supple, no masses, no lymphadenopathy, thyroid nonpalpable.  Skin: Warm and dry, no rashes. Cardiac: Regular rate and rhythm, no murmurs rubs or gallops, no lower extremity edema.  Respiratory: Clear to auscultation bilaterally. Not using accessory muscles, speaking in full sentences.  Impression and Recommendations:

## 2013-08-05 ENCOUNTER — Ambulatory Visit (INDEPENDENT_AMBULATORY_CARE_PROVIDER_SITE_OTHER): Payer: BC Managed Care – PPO | Admitting: Sports Medicine

## 2013-08-05 ENCOUNTER — Encounter: Payer: Self-pay | Admitting: Sports Medicine

## 2013-08-05 VITALS — BP 140/88 | HR 68 | Ht 77.0 in | Wt 264.0 lb

## 2013-08-05 DIAGNOSIS — M109 Gout, unspecified: Secondary | ICD-10-CM

## 2013-08-05 NOTE — Assessment & Plan Note (Addendum)
Left knee swelling, this likely represents a flare of gout. Aspiration and injection as above. Fluids sent off for analysis. Celebrex as needed.  Synovial fluid analysis shows extracellular uric acid crystals, adding allopurinol, previous uric acid levels have ranged from 7-9.

## 2013-08-05 NOTE — Progress Notes (Signed)
  Subjective:    CC: Left knee swelling  HPI: This pleasant 48 year old male with a history of gout comes in with increasing pain and swelling in the left knee, no trauma. Pain is moderate, persistent without radiation. Worse in the suprapatellar recess.  Past medical history, Surgical history, Family history not pertinant except as noted below, Social history, Allergies, and medications have been entered into the medical record, reviewed, and no changes needed.   Review of Systems: No fevers, chills, night sweats, weight loss, chest pain, or shortness of breath.   Objective:    General: Well Developed, well nourished, and in no acute distress.  Neuro: Alert and oriented x3, extra-ocular muscles intact, sensation grossly intact.  HEENT: Normocephalic, atraumatic, pupils equal round reactive to light, neck supple, no masses, no lymphadenopathy, thyroid nonpalpable.  Skin: Warm and dry, no rashes. Cardiac: Regular rate and rhythm, no murmurs rubs or gallops, no lower extremity edema.  Respiratory: Clear to auscultation bilaterally. Not using accessory muscles, speaking in full sentences. Left Knee: Visible and palpable effusion with a fluid wave. ROM full in flexion and extension and lower leg rotation. Ligaments with solid consistent endpoints including ACL, PCL, LCL, MCL. Negative Mcmurray's, Apley's, and Thessalonian tests. Non painful patellar compression. Patellar glide without crepitus. Patellar and quadriceps tendons unremarkable. Hamstring and quadriceps strength is normal.   Procedure: Real-time Ultrasound Guided aspiration/Injection of left knee Device: GE Logiq E  Verbal informed consent obtained.  Time-out conducted.  Noted no overlying erythema, induration, or other signs of local infection.  Skin prepped in a sterile fashion.  Local anesthesia: Topical Ethyl chloride.  With sterile technique and under real time ultrasound guidance:  42 cc of straw-colored fluid that  was slightly cloudy aspirated, syringe switched and 1 cc Kenalog 40, 4 cc lidocaine injected easily. Completed without difficulty  Pain immediately resolved suggesting accurate placement of the medication.  Advised to call if fevers/chills, erythema, induration, drainage, or persistent bleeding.  Images permanently stored and available for review in the ultrasound unit.  Impression: Technically successful ultrasound guided injection.  Impression and Recommendations:

## 2013-08-06 LAB — SYNOVIAL CELL COUNT + DIFF, W/ CRYSTALS
Eosinophils-Synovial: 0 % (ref 0–1)
Lymphocytes-Synovial Fld: 24 % — ABNORMAL HIGH (ref 0–20)
Monocyte/Macrophage: 74 % (ref 50–90)
Neutrophil, Synovial: 2 % (ref 0–25)
WBC, Synovial: 485 uL — ABNORMAL HIGH (ref 0–200)

## 2013-08-06 MED ORDER — ALLOPURINOL 300 MG PO TABS: 300.0000 mg | ORAL_TABLET | Freq: Two times a day (BID) | ORAL | Status: DC

## 2013-08-06 NOTE — Addendum Note (Signed)
Addended by: Silverio Decamp on: 08/06/2013 02:20 PM   Modules accepted: Orders

## 2013-08-13 ENCOUNTER — Other Ambulatory Visit: Payer: Self-pay

## 2013-08-13 ENCOUNTER — Telehealth: Payer: Self-pay

## 2013-08-13 MED ORDER — CELECOXIB 200 MG PO CAPS: ORAL_CAPSULE | ORAL | Status: DC

## 2013-08-13 MED ORDER — NAPROXEN-ESOMEPRAZOLE 500-20 MG PO TBEC: 1.0000 | DELAYED_RELEASE_TABLET | Freq: Two times a day (BID) | ORAL | Status: DC

## 2013-08-13 NOTE — Telephone Encounter (Addendum)
Patient called stated that he could not tell the difference between Celebrex and vimovo, he wants both of those Rx sent to his pharmacy and he will get the cheapest one.

## 2013-08-13 NOTE — Telephone Encounter (Signed)
Noted, I am going to send Celebrex to his existing pharmacy, Vimovo is available cheaper if I send her to a different pharmacy. They will mail it to him.

## 2013-08-30 ENCOUNTER — Other Ambulatory Visit: Payer: Self-pay | Admitting: Sports Medicine

## 2013-08-30 ENCOUNTER — Other Ambulatory Visit: Payer: Self-pay

## 2013-08-30 DIAGNOSIS — E349 Endocrine disorder, unspecified: Secondary | ICD-10-CM

## 2013-08-30 MED ORDER — TESTOSTERONE CYPIONATE 200 MG/ML IM SOLN: 400.0000 mg | INTRAMUSCULAR | Status: DC

## 2013-09-03 ENCOUNTER — Ambulatory Visit (INDEPENDENT_AMBULATORY_CARE_PROVIDER_SITE_OTHER): Payer: BC Managed Care – PPO | Admitting: Sports Medicine

## 2013-09-03 ENCOUNTER — Encounter: Payer: Self-pay | Admitting: Sports Medicine

## 2013-09-03 VITALS — BP 131/84 | HR 70 | Ht 77.0 in | Wt 261.0 lb

## 2013-09-03 DIAGNOSIS — M109 Gout, unspecified: Secondary | ICD-10-CM

## 2013-09-03 NOTE — Assessment & Plan Note (Signed)
Doing extremely well after aspiration and injection of the knee. Allopurinol does give him some stomach upset, we will decrease to once a day. Return to see me in several months. At that point we can consider checking uric acid levels, they have ranged from 7-9.

## 2013-09-03 NOTE — Progress Notes (Signed)
  Subjective:    CC: Followup  HPI: Gout: We aspirated and injected Jonathan Oliver's knee at the last visit, he returns with symptoms completely resolved. We also started allopurinol and Indocin his other joints feel significantly better, he does get some indigestion and loose stools however.  Past medical history, Surgical history, Family history not pertinant except as noted below, Social history, Allergies, and medications have been entered into the medical record, reviewed, and no changes needed.   Review of Systems: No fevers, chills, night sweats, weight loss, chest pain, or shortness of breath.   Objective:    General: Well Developed, well nourished, and in no acute distress.  Neuro: Alert and oriented x3, extra-ocular muscles intact, sensation grossly intact.  HEENT: Normocephalic, atraumatic, pupils equal round reactive to light, neck supple, no masses, no lymphadenopathy, thyroid nonpalpable.  Skin: Warm and dry, no rashes. Cardiac: Regular rate and rhythm, no murmurs rubs or gallops, no lower extremity edema.  Respiratory: Clear to auscultation bilaterally. Not using accessory muscles, speaking in full sentences. Left Knee: Normal to inspection with no erythema or effusion or obvious bony abnormalities. Palpation normal with no warmth, joint line tenderness, patellar tenderness, or condyle tenderness. ROM full in flexion and extension and lower leg rotation. Ligaments with solid consistent endpoints including ACL, PCL, LCL, MCL. Negative Mcmurray's, Apley's, and Thessalonian tests. Non painful patellar compression. Patellar glide without crepitus. Patellar and quadriceps tendons unremarkable. Hamstring and quadriceps strength is normal.   Impression and Recommendations:

## 2013-10-29 ENCOUNTER — Ambulatory Visit: Payer: BC Managed Care – PPO | Admitting: Sports Medicine

## 2013-11-19 ENCOUNTER — Encounter: Payer: Self-pay | Admitting: Sports Medicine

## 2013-11-19 ENCOUNTER — Ambulatory Visit (INDEPENDENT_AMBULATORY_CARE_PROVIDER_SITE_OTHER): Payer: BC Managed Care – PPO | Admitting: Sports Medicine

## 2013-11-19 VITALS — BP 139/89 | HR 73 | Ht 77.0 in | Wt 265.0 lb

## 2013-11-19 DIAGNOSIS — M109 Gout, unspecified: Secondary | ICD-10-CM

## 2013-11-19 NOTE — Assessment & Plan Note (Signed)
Currently having acute flare. Aspiration and injection of left knee, 36 cc. Currently on allopurinol and doing much better with him far fewer flares however we need to recheck his uric acid levels.

## 2013-11-19 NOTE — Progress Notes (Signed)
  Subjective:    CC:  Left knee  HPI: Jonathan Oliver is an extremely pleasant 48 year old male with a history of gout, he has done extremely well since we started allopurinol. His last uric acid levels were in the sevens but were checked several years ago. Unfortunately for the past several days he's had increasing pain and swelling in the left knee, moderate, persistent.  Past medical history, Surgical history, Family history not pertinant except as noted below, Social history, Allergies, and medications have been entered into the medical record, reviewed, and no changes needed.   Review of Systems: No fevers, chills, night sweats, weight loss, chest pain, or shortness of breath.   Objective:    General: Well Developed, well nourished, and in no acute distress.  Neuro: Alert and oriented x3, extra-ocular muscles intact, sensation grossly intact.  HEENT: Normocephalic, atraumatic, pupils equal round reactive to light, neck supple, no masses, no lymphadenopathy, thyroid nonpalpable.  Skin: Warm and dry, no rashes. Cardiac: Regular rate and rhythm, no murmurs rubs or gallops, no lower extremity edema.  Respiratory: Clear to auscultation bilaterally. Not using accessory muscles, speaking in full sentences. Left Knee: Visibly swollen with a palpable fluid wave. ROM full in flexion and extension and lower leg rotation. Ligaments with solid consistent endpoints including ACL, PCL, LCL, MCL. Negative Mcmurray's, Apley's, and Thessalonian tests. Non painful patellar compression. Patellar glide without crepitus. Patellar and quadriceps tendons unremarkable. Hamstring and quadriceps strength is normal.   Procedure: Real-time Ultrasound Guided aspiration/Injection of left knee Device: GE Logiq E  Verbal informed consent obtained.  Time-out conducted.  Noted no overlying erythema, induration, or other signs of local infection.  Skin prepped in a sterile fashion.  Local anesthesia: Topical Ethyl  chloride.  With sterile technique and under real time ultrasound guidance:  36 cc of straw-colored fluid was aspirated, syringe switched and 2 cc Kenalog 40, 4 cc lidocaine injected easily. Completed without difficulty  Pain immediately resolved suggesting accurate placement of the medication.  Advised to call if fevers/chills, erythema, induration, drainage, or persistent bleeding.  Images permanently stored and available for review in the ultrasound unit.  Impression: Technically successful ultrasound guided injection.  Impression and Recommendations:

## 2013-11-20 LAB — URIC ACID: Uric Acid, Serum: 4.5 mg/dL (ref 4.0–7.8)

## 2013-11-24 NOTE — Telephone Encounter (Signed)
error 

## 2013-12-31 ENCOUNTER — Ambulatory Visit (INDEPENDENT_AMBULATORY_CARE_PROVIDER_SITE_OTHER): Payer: BC Managed Care – PPO | Admitting: Sports Medicine

## 2013-12-31 ENCOUNTER — Encounter: Payer: Self-pay | Admitting: Sports Medicine

## 2013-12-31 VITALS — BP 116/74 | HR 76 | Ht 77.0 in | Wt 259.0 lb

## 2013-12-31 DIAGNOSIS — M109 Gout, unspecified: Secondary | ICD-10-CM

## 2013-12-31 MED ORDER — COLCHICINE 0.6 MG PO TABS: 0.6000 mg | ORAL_TABLET | Freq: Every day | ORAL | Status: DC

## 2013-12-31 MED ORDER — DIPHENOXYLATE-ATROPINE 2.5-0.025 MG PO TABS: ORAL_TABLET | ORAL | Status: DC

## 2013-12-31 NOTE — Assessment & Plan Note (Signed)
Currently having another flare in his left knee. On allopurinol, uric acid levels have decreased from 9 to 4.5. Injection as above. Adding daily colchicine 0.6 mg daily. Lomotil for diarrhea.

## 2013-12-31 NOTE — Progress Notes (Signed)
  Subjective:    CC: Left knee swelling and pain   HPI:  Jonathan Oliver has gout, we have done a really good job keeping his uric acid levels low, the most recent of which were 4.5. Unfortunately he is having another flare with left knee pain, there is a bit of swelling but no where near like before. His previous aspiration and injection was almost 2 months ago. Symptoms are moderate, persistent.  Past medical history, Surgical history, Family history not pertinant except as noted below, Social history, Allergies, and medications have been entered into the medical record, reviewed, and no changes needed.   Review of Systems: No fevers, chills, night sweats, weight loss, chest pain, or shortness of breath.   Objective:    General: Well Developed, well nourished, and in no acute distress.  Neuro: Alert and oriented x3, extra-ocular muscles intact, sensation grossly intact.  HEENT: Normocephalic, atraumatic, pupils equal round reactive to light, neck supple, no masses, no lymphadenopathy, thyroid nonpalpable.  Skin: Warm and dry, no rashes. Cardiac: Regular rate and rhythm, no murmurs rubs or gallops, no lower extremity edema.  Respiratory: Clear to auscultation bilaterally. Not using accessory muscles, speaking in full sentences. Left Knee: Normal to inspection with no erythema or effusion or obvious bony abnormalities. Only minimal effusion, tender to palpation at the medial joint line. ROM normal in flexion and extension and lower leg rotation. Ligaments with solid consistent endpoints including ACL, PCL, LCL, MCL. Negative Mcmurray's and provocative meniscal tests. Non painful patellar compression. Patellar and quadriceps tendons unremarkable. Hamstring and quadriceps strength is normal.  Procedure: Real-time Ultrasound Guided Injection of left knee Device: GE Logiq E  Verbal informed consent obtained.  Time-out conducted.  Noted no overlying erythema, induration, or other signs of local  infection.  Skin prepped in a sterile fashion.  Local anesthesia: Topical Ethyl chloride.  With sterile technique and under real time ultrasound guidance:  2 cc Kenalog 40, 4 cc lidocaine injected easily into the suprapatellar recess. Completed without difficulty  Pain immediately resolved suggesting accurate placement of the medication.  Advised to call if fevers/chills, erythema, induration, drainage, or persistent bleeding.  Images permanently stored and available for review in the ultrasound unit.  Impression: Technically successful ultrasound guided injection.  Impression and Recommendations:

## 2014-01-17 ENCOUNTER — Ambulatory Visit (INDEPENDENT_AMBULATORY_CARE_PROVIDER_SITE_OTHER): Payer: BC Managed Care – PPO | Admitting: Sports Medicine

## 2014-01-17 ENCOUNTER — Telehealth: Payer: Self-pay | Admitting: Sports Medicine

## 2014-01-17 ENCOUNTER — Encounter: Payer: Self-pay | Admitting: Sports Medicine

## 2014-01-17 VITALS — BP 132/89 | HR 78 | Ht 77.0 in | Wt 253.0 lb

## 2014-01-17 DIAGNOSIS — G4726 Circadian rhythm sleep disorder, shift work type: Secondary | ICD-10-CM | POA: Insufficient documentation

## 2014-01-17 DIAGNOSIS — M109 Gout, unspecified: Secondary | ICD-10-CM

## 2014-01-17 DIAGNOSIS — E349 Endocrine disorder, unspecified: Secondary | ICD-10-CM

## 2014-01-17 DIAGNOSIS — E291 Testicular hypofunction: Secondary | ICD-10-CM

## 2014-01-17 DIAGNOSIS — G4710 Hypersomnia, unspecified: Secondary | ICD-10-CM

## 2014-01-17 DIAGNOSIS — G4719 Other hypersomnia: Secondary | ICD-10-CM

## 2014-01-17 DIAGNOSIS — I10 Essential (primary) hypertension: Secondary | ICD-10-CM

## 2014-01-17 DIAGNOSIS — G4733 Obstructive sleep apnea (adult) (pediatric): Secondary | ICD-10-CM

## 2014-01-17 HISTORY — DX: Obstructive sleep apnea (adult) (pediatric): G47.33

## 2014-01-17 LAB — COMPREHENSIVE METABOLIC PANEL
ALT: 23 U/L (ref 0–53)
AST: 26 U/L (ref 0–37)
Albumin: 3.9 g/dL (ref 3.5–5.2)
Alkaline Phosphatase: 35 U/L — ABNORMAL LOW (ref 39–117)
Creat: 1.8 mg/dL — ABNORMAL HIGH (ref 0.50–1.35)
Glucose, Bld: 81 mg/dL (ref 70–99)
Potassium: 5.2 mEq/L (ref 3.5–5.3)
Sodium: 136 mEq/L (ref 135–145)
Total Bilirubin: 0.9 mg/dL (ref 0.2–1.2)
Total Protein: 6.9 g/dL (ref 6.0–8.3)

## 2014-01-17 LAB — COMPREHENSIVE METABOLIC PANEL WITH GFR
BUN: 18 mg/dL (ref 6–23)
CO2: 27 meq/L (ref 19–32)
Calcium: 10.1 mg/dL (ref 8.4–10.5)
Chloride: 103 meq/L (ref 96–112)

## 2014-01-17 LAB — CBC
HCT: 45.6 % (ref 39.0–52.0)
Hemoglobin: 14.9 g/dL (ref 13.0–17.0)
MCH: 25.6 pg — ABNORMAL LOW (ref 26.0–34.0)
MCHC: 32.7 g/dL (ref 30.0–36.0)
MCV: 78.5 fL (ref 78.0–100.0)
Platelets: 314 10*3/uL (ref 150–400)
RBC: 5.81 MIL/uL (ref 4.22–5.81)
RDW: 18.3 % — ABNORMAL HIGH (ref 11.5–15.5)
WBC: 8.8 10*3/uL (ref 4.0–10.5)

## 2014-01-17 LAB — TSH: TSH: 0.529 u[IU]/mL (ref 0.350–4.500)

## 2014-01-17 NOTE — Assessment & Plan Note (Signed)
Doing extremely well on allopurinol and daily colchicine. No further flares. Return as needed.

## 2014-01-17 NOTE — Telephone Encounter (Signed)
Done

## 2014-01-17 NOTE — Assessment & Plan Note (Signed)
Recheck in testosterone levels along with CBC and metabolic panel. TSH.

## 2014-01-17 NOTE — Assessment & Plan Note (Signed)
Blood pressure is okay

## 2014-01-17 NOTE — Progress Notes (Signed)
  Subjective:    CC: Followup  HPI: Jonathan Oliver is a very pleasant 54 her old male coming in with fatigue for several weeks to a month. Symptoms are moderate, persistent, he denies any depressive symptoms, feels excessive daytime sleepiness, he tells me he sleeps anywhere between 6 and 10 hours per night, and wakes well rested. He does have a history of sleep apnea, and has had a palatoplasty and felt significantly better.  Gallops: No further flares since being on allopurinol and colchicine.  Hypertension: Well controlled.  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  Hypogonadism: Most recent testosterone injection was one week ago.  Past medical history, Surgical history, Family history not pertinant except as noted below, Social history, Allergies, and medications have been entered into the medical record, reviewed, and no changes needed.   Review of Systems: No fevers, chills, night sweats, weight loss, chest pain, or shortness of breath.   Objective:    General: Well Developed, well nourished, and in no acute distress.  Neuro: Alert and oriented x3, extra-ocular muscles intact, sensation grossly intact.  HEENT: Normocephalic, atraumatic, pupils equal round reactive to light, neck supple, no masses, no lymphadenopathy, thyroid nonpalpable.  Skin: Warm and dry, no rashes. Cardiac: Regular rate and rhythm, no murmurs rubs or gallops, no lower extremity edema.  Respiratory: Clear to auscultation bilaterally. Not using accessory muscles, speaking in full sentences.  Impression and Recommendations:

## 2014-01-17 NOTE — Assessment & Plan Note (Signed)
Unclear etiology at this point, does not sound like depression. He did have sleep apnea and has had a palatoplasty. I think we should recheck a sleep study to evaluate improvement. Also check a TSH, CBC, metabolic panel, and testosterone levels.

## 2014-01-17 NOTE — Telephone Encounter (Signed)
Re: Referral for [facility] Referral needs to be put in as an Order so that it can go to their workqueue.  Thank you

## 2014-01-18 LAB — TESTOSTERONE: Testosterone: 880 ng/dL (ref 300–890)

## 2014-02-17 ENCOUNTER — Encounter (HOSPITAL_BASED_OUTPATIENT_CLINIC_OR_DEPARTMENT_OTHER): Payer: BC Managed Care – PPO

## 2014-02-18 ENCOUNTER — Ambulatory Visit: Payer: BC Managed Care – PPO | Admitting: Sports Medicine

## 2014-02-18 ENCOUNTER — Ambulatory Visit (INDEPENDENT_AMBULATORY_CARE_PROVIDER_SITE_OTHER): Payer: BC Managed Care – PPO

## 2014-02-18 ENCOUNTER — Telehealth: Payer: Self-pay

## 2014-02-18 ENCOUNTER — Encounter: Payer: Self-pay | Admitting: Sports Medicine

## 2014-02-18 ENCOUNTER — Ambulatory Visit (INDEPENDENT_AMBULATORY_CARE_PROVIDER_SITE_OTHER): Payer: BC Managed Care – PPO | Admitting: Sports Medicine

## 2014-02-18 VITALS — BP 133/90 | HR 72 | Ht 77.0 in | Wt 254.0 lb

## 2014-02-18 DIAGNOSIS — M25562 Pain in left knee: Secondary | ICD-10-CM | POA: Insufficient documentation

## 2014-02-18 DIAGNOSIS — M25569 Pain in unspecified knee: Secondary | ICD-10-CM

## 2014-02-18 DIAGNOSIS — M25469 Effusion, unspecified knee: Secondary | ICD-10-CM

## 2014-02-18 DIAGNOSIS — M109 Gout, unspecified: Secondary | ICD-10-CM

## 2014-02-18 HISTORY — DX: Pain in left knee: M25.562

## 2014-02-18 MED ORDER — INDOMETHACIN 50 MG PO CAPS: 50.0000 mg | ORAL_CAPSULE | Freq: Two times a day (BID) | ORAL | Status: DC

## 2014-02-18 MED ORDER — COLCHICINE 0.6 MG PO TABS: 0.6000 mg | ORAL_TABLET | Freq: Two times a day (BID) | ORAL | Status: DC

## 2014-02-18 NOTE — Progress Notes (Signed)
  Subjective:    CC: Followup  HPI: Left knee pain: Jonathan Oliver had his gout, is well controlled and his most recent uric acid level was 4.5. He is currently taking allopurinol and colchicine 0.6 mg daily, unfortunately had recurrent knee pain, and has had multiple injections the most recent which was a month ago. Is moderate, persistent, localized anteriorly and laterally. Severe, persistent.  Past medical history, Surgical history, Family history not pertinant except as noted below, Social history, Allergies, and medications have been entered into the medical record, reviewed, and no changes needed.   Review of Systems: No fevers, chills, night sweats, weight loss, chest pain, or shortness of breath.   Objective:    General: Well Developed, well nourished, and in no acute distress.  Neuro: Alert and oriented x3, extra-ocular muscles intact, sensation grossly intact.  HEENT: Normocephalic, atraumatic, pupils equal round reactive to light, neck supple, no masses, no lymphadenopathy, thyroid nonpalpable.  Skin: Warm and dry, no rashes. Cardiac: Regular rate and rhythm, no murmurs rubs or gallops, no lower extremity edema.  Respiratory: Clear to auscultation bilaterally. Not using accessory muscles, speaking in full sentences.  Procedure: Real-time Ultrasound Guided Injection of left knee Device: GE Logiq E  Verbal informed consent obtained.  Time-out conducted.  Noted no overlying erythema, induration, or other signs of local infection.  Skin prepped in a sterile fashion.  Local anesthesia: Topical Ethyl chloride.  With sterile technique and under real time ultrasound guidance:  2 cc Kenalog 40, 4 cc lidocaine injected easily. Completed without difficulty  Pain immediately resolved suggesting accurate placement of the medication.  Advised to call if fevers/chills, erythema, induration, drainage, or persistent bleeding.  Images permanently stored and available for review in the ultrasound  unit.  Impression: Technically successful ultrasound guided injection.  Impression and Recommendations:

## 2014-02-18 NOTE — Assessment & Plan Note (Signed)
Persistent knee pain on the left, he has had multiple aspirations and injections. Uric acid levels are now down to 4.5, continue allopurinol, doubling colchicine to twice a day. We are going to image the knee to look for anatomic pathology.

## 2014-02-18 NOTE — Assessment & Plan Note (Signed)
Persistent left knee pain for greater than 6 weeks despite conservative measures including physical therapy, incentive, and injections. Pain at the medial jointline anteriorly suggestive of a anterior horn of the medial meniscus tear. X-rays, MRI. He continues to have pain despite good control of his gout.

## 2014-02-18 NOTE — Telephone Encounter (Signed)
No prior auth needed for MRI left knee without contrast. Per Ivin Booty with [facility].

## 2014-02-18 NOTE — Telephone Encounter (Signed)
Patient called stated that he is in pain and he would like something for pain sent to [facility]

## 2014-02-18 NOTE — Telephone Encounter (Signed)
Double on the colchicine to 0.6 mg twice a day, I'm going to add indomethacin.

## 2014-02-19 NOTE — Telephone Encounter (Signed)
Patient advised of recommendations.  

## 2014-02-25 ENCOUNTER — Ambulatory Visit (HOSPITAL_COMMUNITY): Payer: BC Managed Care – PPO

## 2014-03-07 ENCOUNTER — Other Ambulatory Visit: Payer: Self-pay | Admitting: *Deleted

## 2014-03-07 DIAGNOSIS — E349 Endocrine disorder, unspecified: Secondary | ICD-10-CM

## 2014-03-07 MED ORDER — TESTOSTERONE CYPIONATE 200 MG/ML IM SOLN: 400.0000 mg | INTRAMUSCULAR | Status: DC

## 2014-03-14 ENCOUNTER — Other Ambulatory Visit: Payer: Self-pay | Admitting: *Deleted

## 2014-03-14 DIAGNOSIS — I10 Essential (primary) hypertension: Secondary | ICD-10-CM

## 2014-03-14 MED ORDER — LISINOPRIL-HYDROCHLOROTHIAZIDE 20-25 MG PO TABS: 1.0000 | ORAL_TABLET | Freq: Every day | ORAL | Status: DC

## 2014-03-18 ENCOUNTER — Ambulatory Visit (HOSPITAL_COMMUNITY): Admission: RE | Admit: 2014-03-18 | Payer: BC Managed Care – PPO | Source: Ambulatory Visit

## 2014-04-01 ENCOUNTER — Encounter: Payer: Self-pay | Admitting: Sports Medicine

## 2014-04-01 ENCOUNTER — Ambulatory Visit (INDEPENDENT_AMBULATORY_CARE_PROVIDER_SITE_OTHER): Payer: BC Managed Care – PPO | Admitting: Sports Medicine

## 2014-04-01 VITALS — BP 148/98 | HR 62 | Ht 77.0 in | Wt 255.0 lb

## 2014-04-01 DIAGNOSIS — M1A0620 Idiopathic chronic gout, left knee, without tophus (tophi): Secondary | ICD-10-CM | POA: Diagnosis not present

## 2014-04-01 NOTE — Progress Notes (Signed)
  Subjective:    CC: Followup  HPI: This is a very pleasant 48 year old male, we have had great difficulty controlling his gout and his knee pain. He has had multiple injections, subsequently with allopurinol we were able to decrease his uric acid levels to the mid fours, he did well but it took doubling his colchicine to 0.6 mg twice a day to resolve his pain. He is overall doing extremely well and is very happy with the results.  Past medical history, Surgical history, Family history not pertinant except as noted below, Social history, Allergies, and medications have been entered into the medical record, reviewed, and no changes needed.   Review of Systems: No fevers, chills, night sweats, weight loss, chest pain, or shortness of breath.   Objective:    General: Well Developed, well nourished, and in no acute distress.  Neuro: Alert and oriented x3, extra-ocular muscles intact, sensation grossly intact.  HEENT: Normocephalic, atraumatic, pupils equal round reactive to light, neck supple, no masses, no lymphadenopathy, thyroid nonpalpable.  Skin: Warm and dry, no rashes. Cardiac: Regular rate and rhythm, no murmurs rubs or gallops, no lower extremity edema.  Respiratory: Clear to auscultation bilaterally. Not using accessory muscles, speaking in full sentences. Left Knee: Normal to inspection with no erythema or effusion or obvious bony abnormalities. Palpation normal with no warmth or joint line tenderness or patellar tenderness or condyle tenderness. ROM normal in flexion and extension and lower leg rotation. Ligaments with solid consistent endpoints including ACL, PCL, LCL, MCL. Negative Mcmurray's and provocative meniscal tests. Non painful patellar compression. Patellar and quadriceps tendons unremarkable. Hamstring and quadriceps strength is normal.  Impression and Recommendations:

## 2014-04-01 NOTE — Assessment & Plan Note (Signed)
We have had great difficulty controlling Jonathan Oliver knee pain. He does have gouty arthritis. After multiple injections, and uric acid levels well controlled on allopurinol, we doubled his colchicine, and he returns today with persistent lack of pain. He is very happy with the results. No changes.

## 2014-04-02 ENCOUNTER — Ambulatory Visit (HOSPITAL_COMMUNITY): Payer: BC Managed Care – PPO

## 2014-04-04 ENCOUNTER — Ambulatory Visit (HOSPITAL_COMMUNITY): Payer: BC Managed Care – PPO

## 2014-04-25 ENCOUNTER — Other Ambulatory Visit: Payer: Self-pay

## 2014-04-25 DIAGNOSIS — M1000 Idiopathic gout, unspecified site: Secondary | ICD-10-CM

## 2014-04-25 MED ORDER — INDOMETHACIN 50 MG PO CAPS: 50.0000 mg | ORAL_CAPSULE | Freq: Two times a day (BID) | ORAL | Status: DC

## 2014-04-25 MED ORDER — ALLOPURINOL 300 MG PO TABS: 300.0000 mg | ORAL_TABLET | Freq: Every day | ORAL | Status: DC

## 2014-07-31 ENCOUNTER — Other Ambulatory Visit: Payer: Self-pay

## 2014-07-31 DIAGNOSIS — I10 Essential (primary) hypertension: Secondary | ICD-10-CM

## 2014-07-31 MED ORDER — METOPROLOL TARTRATE 100 MG PO TABS: 200.0000 mg | ORAL_TABLET | Freq: Two times a day (BID) | ORAL | Status: DC

## 2014-07-31 MED ORDER — AMLODIPINE BESYLATE 10 MG PO TABS: ORAL_TABLET | ORAL | Status: DC

## 2014-09-21 ENCOUNTER — Encounter: Payer: Self-pay | Admitting: Family Medicine

## 2014-09-21 ENCOUNTER — Ambulatory Visit (INDEPENDENT_AMBULATORY_CARE_PROVIDER_SITE_OTHER): Payer: Self-pay | Admitting: Family Medicine

## 2014-09-21 VITALS — BP 119/79 | HR 80 | Temp 98.4°F | Wt 265.0 lb

## 2014-09-21 DIAGNOSIS — J189 Pneumonia, unspecified organism: Secondary | ICD-10-CM

## 2014-09-21 MED ORDER — HYDROCODONE-HOMATROPINE 5-1.5 MG/5ML PO SYRP: 5.0000 mL | ORAL_SOLUTION | Freq: Three times a day (TID) | ORAL | Status: DC | PRN

## 2014-09-21 MED ORDER — LEVOFLOXACIN 500 MG PO TABS: 500.0000 mg | ORAL_TABLET | Freq: Every day | ORAL | Status: DC

## 2014-09-21 NOTE — Progress Notes (Signed)
CC: Jonathan Oliver. is a 49 y.o. male is here for cough and congestion since friday   Subjective: HPI:  Productive cough with fatigue decreased appetite and fever/chills that has been present since Friday. It was slightly improved with Mucinex DM over the weekend however as of Sunday symptoms have worsened and no over-the-counter cough and cold medication seems to be beneficial. Symptoms are bad enough to where he is getting only 1-2 hours of sleep at night due to discomfort and cough. He's had some wheezing which is not common for him. He has chest discomfort but is described as a soreness on the periphery of the entire chest and back breathing deep. No exertional chest pain. He had some nasal congestion on Friday but this has resolved. Denies confusion, vomiting, abdominal pain, GI disturbance, rashes, photophobia, nor blood and sputum   Review Of Systems Outlined In HPI  Past Medical History  Diagnosis Date  . Hypertension   . MRSA (methicillin resistant Staphylococcus aureus)   . Gout   . Kidney stones   . IFG (impaired fasting glucose)     Past Surgical History  Procedure Laterality Date  . Sinus surgey    . Uvuloplasty for osa     Family History  Problem Relation Age of Onset  . Hypertension Neg Hx     History   Social History  . Marital Status: Married    Spouse Name: N/A  . Number of Children: N/A  . Years of Education: N/A   Occupational History  . Not on file.   Social History Main Topics  . Smoking status: Never Smoker   . Smokeless tobacco: Not on file  . Alcohol Use: 25.2 oz/week    42 Cans of beer per week     Comment: 5-6 beers a day  . Drug Use: Not on file  . Sexual Activity: Not on file   Other Topics Concern  . Not on file   Social History Narrative     Objective: BP 119/79 mmHg  Pulse 80  Temp(Src) 98.4 F (36.9 C) (Oral)  Wt 265 lb (120.203 kg)  SpO2 94%  General: Alert and Oriented, No Acute Distress HEENT: Pupils equal, round,  reactive to light. Conjunctivae clear.  External ears unremarkable, canals clear with intact TMs with appropriate landmarks.  Middle ear appears open without effusion. Pink inferior turbinates.  Moist mucous membranes, pharynx without inflammation nor lesions.  Neck supple without palpable lymphadenopathy nor abnormal masses. Lungs: comfortablework of breathing with rhonchi heard in the left posterior lung fields no wheezing nor rales nor abnormal breath sounds elsewhere Cardiac: Regular rate and rhythm. Normal S1/S2.  No murmurs, rubs, nor gallops.   Extremities: No peripheral edema.  Strong peripheral pulses.  Mental Status: No depression, anxiety, nor agitation. Skin: Warm and dry.  Assessment & Plan: Haydin was seen today for cough and congestion since friday.  Diagnoses and all orders for this visit:  CAP (community acquired pneumonia) Orders: -     levofloxacin (LEVAQUIN) 500 MG tablet; Take 1 tablet (500 mg total) by mouth daily. -     HYDROcodone-homatropine (HYCODAN) 5-1.5 MG/5ML syrup; Take 5 mLs by mouth every 8 (eight) hours as needed for cough.   Suspect community-acquired pneumonia therefore start Levaquin. If no better by Friday please let me know next step would be x-ray of the chest. Continue Mucinex however can also start Hycodan to help with sleep.Signs and symptoms requring emergent/urgent reevaluation were discussed with the patient.  Return if  symptoms worsen or fail to improve.

## 2014-10-03 ENCOUNTER — Other Ambulatory Visit: Payer: Self-pay | Admitting: Sports Medicine

## 2014-10-03 DIAGNOSIS — E349 Endocrine disorder, unspecified: Secondary | ICD-10-CM

## 2014-10-03 MED ORDER — TESTOSTERONE CYPIONATE 200 MG/ML IM SOLN: 400.0000 mg | INTRAMUSCULAR | Status: DC

## 2014-10-31 ENCOUNTER — Other Ambulatory Visit: Payer: Self-pay | Admitting: Sports Medicine

## 2014-10-31 DIAGNOSIS — M1000 Idiopathic gout, unspecified site: Secondary | ICD-10-CM

## 2014-10-31 MED ORDER — ALLOPURINOL 300 MG PO TABS
300.0000 mg | ORAL_TABLET | Freq: Every day | ORAL | Status: DC
Start: 2014-10-31 — End: 2014-12-12

## 2014-11-07 ENCOUNTER — Other Ambulatory Visit: Payer: Self-pay | Admitting: Sports Medicine

## 2014-11-07 DIAGNOSIS — I10 Essential (primary) hypertension: Secondary | ICD-10-CM

## 2014-11-07 MED ORDER — METOPROLOL TARTRATE 100 MG PO TABS: 200.0000 mg | ORAL_TABLET | Freq: Two times a day (BID) | ORAL | Status: DC

## 2014-11-07 NOTE — Telephone Encounter (Signed)
Left message for Pt to set up f/u appt.

## 2014-12-12 ENCOUNTER — Other Ambulatory Visit: Payer: Self-pay | Admitting: Sports Medicine

## 2014-12-12 DIAGNOSIS — M1000 Idiopathic gout, unspecified site: Secondary | ICD-10-CM

## 2014-12-12 MED ORDER — ALLOPURINOL 300 MG PO TABS
300.0000 mg | ORAL_TABLET | Freq: Every day | ORAL | Status: DC
Start: 2014-12-12 — End: 2014-12-23

## 2014-12-12 NOTE — Telephone Encounter (Signed)
Left message on Pt's voicemail to schedule appt.

## 2014-12-23 ENCOUNTER — Ambulatory Visit (INDEPENDENT_AMBULATORY_CARE_PROVIDER_SITE_OTHER): Payer: BLUE CROSS/BLUE SHIELD | Admitting: Sports Medicine

## 2014-12-23 ENCOUNTER — Encounter: Payer: Self-pay | Admitting: Sports Medicine

## 2014-12-23 VITALS — BP 140/94 | HR 77 | Ht 77.0 in | Wt 271.0 lb

## 2014-12-23 DIAGNOSIS — J387 Other diseases of larynx: Secondary | ICD-10-CM

## 2014-12-23 DIAGNOSIS — M1000 Idiopathic gout, unspecified site: Secondary | ICD-10-CM | POA: Diagnosis not present

## 2014-12-23 DIAGNOSIS — N5201 Erectile dysfunction due to arterial insufficiency: Secondary | ICD-10-CM | POA: Insufficient documentation

## 2014-12-23 DIAGNOSIS — K219 Gastro-esophageal reflux disease without esophagitis: Secondary | ICD-10-CM

## 2014-12-23 DIAGNOSIS — I10 Essential (primary) hypertension: Secondary | ICD-10-CM | POA: Diagnosis not present

## 2014-12-23 DIAGNOSIS — E291 Testicular hypofunction: Secondary | ICD-10-CM

## 2014-12-23 DIAGNOSIS — E349 Endocrine disorder, unspecified: Secondary | ICD-10-CM

## 2014-12-23 HISTORY — DX: Erectile dysfunction due to arterial insufficiency: N52.01

## 2014-12-23 MED ORDER — AMLODIPINE BESYLATE 10 MG PO TABS: 10.0000 mg | ORAL_TABLET | Freq: Every day | ORAL | Status: DC

## 2014-12-23 MED ORDER — TESTOSTERONE CYPIONATE 200 MG/ML IM SOLN: 200.0000 mg | INTRAMUSCULAR | Status: DC

## 2014-12-23 MED ORDER — ALLOPURINOL 300 MG PO TABS: 300.0000 mg | ORAL_TABLET | Freq: Every day | ORAL | Status: DC

## 2014-12-23 MED ORDER — OMEPRAZOLE 40 MG PO CPDR: 40.0000 mg | DELAYED_RELEASE_CAPSULE | Freq: Every day | ORAL | Status: DC

## 2014-12-23 MED ORDER — LISINOPRIL-HYDROCHLOROTHIAZIDE 20-25 MG PO TABS: 1.0000 | ORAL_TABLET | Freq: Every day | ORAL | Status: DC

## 2014-12-23 MED ORDER — SILDENAFIL CITRATE 20 MG PO TABS: 100.0000 mg | ORAL_TABLET | ORAL | Status: DC | PRN

## 2014-12-23 MED ORDER — INDOMETHACIN 50 MG PO CAPS: 50.0000 mg | ORAL_CAPSULE | Freq: Two times a day (BID) | ORAL | Status: DC

## 2014-12-23 MED ORDER — METOPROLOL TARTRATE 100 MG PO TABS: 200.0000 mg | ORAL_TABLET | Freq: Two times a day (BID) | ORAL | Status: DC

## 2014-12-23 NOTE — Assessment & Plan Note (Signed)
Refilling testosterone, recheck at the end of next week

## 2014-12-23 NOTE — Assessment & Plan Note (Signed)
Doing well, refilling medications.

## 2014-12-23 NOTE — Assessment & Plan Note (Signed)
Cheap Viagra prescribed

## 2014-12-23 NOTE — Assessment & Plan Note (Signed)
Elevated but has run out of a couple of his medications

## 2014-12-23 NOTE — Progress Notes (Signed)
  Subjective:    CC: Follow-up  HPI: Hypertension: Controlled, has been off of a couple of his medications.  Hypergonadism: Doing well, needs a refill.  Erectile dysfunction: Like a refill on sildenafil, doing well.  Reflux: Stable, needs a refill on omeprazole.  Gout: Well-controlled, needs a refill on allopurinol and indomethacin.  Past medical history, Surgical history, Family history not pertinant except as noted below, Social history, Allergies, and medications have been entered into the medical record, reviewed, and no changes needed.   Review of Systems: No fevers, chills, night sweats, weight loss, chest pain, or shortness of breath.   Objective:    General: Well Developed, well nourished, and in no acute distress.  Neuro: Alert and oriented x3, extra-ocular muscles intact, sensation grossly intact.  HEENT: Normocephalic, atraumatic, pupils equal round reactive to light, neck supple, no masses, no lymphadenopathy, thyroid nonpalpable.  Skin: Warm and dry, no rashes. Cardiac: Regular rate and rhythm, no murmurs rubs or gallops, no lower extremity edema.  Respiratory: Clear to auscultation bilaterally. Not using accessory muscles, speaking in full sentences.  Impression and Recommendations:

## 2014-12-31 LAB — CBC
HCT: 49.4 % (ref 39.0–52.0)
Hemoglobin: 16.6 g/dL (ref 13.0–17.0)
MCH: 26.4 pg (ref 26.0–34.0)
MCHC: 33.6 g/dL (ref 30.0–36.0)
MCV: 78.5 fL (ref 78.0–100.0)
Platelets: 231 10*3/uL (ref 150–400)
RBC: 6.29 MIL/uL — ABNORMAL HIGH (ref 4.22–5.81)
RDW: 17.1 % — ABNORMAL HIGH (ref 11.5–15.5)
WBC: 7.8 10*3/uL (ref 4.0–10.5)

## 2014-12-31 LAB — LIPID PANEL
Cholesterol: 96 mg/dL (ref 0–200)
HDL: 28 mg/dL — ABNORMAL LOW (ref >=40)
LDL Cholesterol: 41 mg/dL (ref 0–99)
Total CHOL/HDL Ratio: 3.4 Ratio
Triglycerides: 134 mg/dL (ref <150)
VLDL: 27 mg/dL (ref 0–40)

## 2014-12-31 LAB — COMPREHENSIVE METABOLIC PANEL WITH GFR
ALT: 81 U/L — ABNORMAL HIGH (ref 0–53)
Albumin: 4.3 g/dL (ref 3.5–5.2)
CO2: 22 meq/L (ref 19–32)
Calcium: 9.1 mg/dL (ref 8.4–10.5)
Chloride: 104 meq/L (ref 96–112)
Potassium: 4.5 meq/L (ref 3.5–5.3)
Total Protein: 6.9 g/dL (ref 6.0–8.3)

## 2014-12-31 LAB — COMPREHENSIVE METABOLIC PANEL
AST: 47 U/L — ABNORMAL HIGH (ref 0–37)
Alkaline Phosphatase: 57 U/L (ref 39–117)
BUN: 28 mg/dL — ABNORMAL HIGH (ref 6–23)
Creat: 1.67 mg/dL — ABNORMAL HIGH (ref 0.50–1.35)
Glucose, Bld: 79 mg/dL (ref 70–99)
Sodium: 138 mEq/L (ref 135–145)
Total Bilirubin: 0.5 mg/dL (ref 0.2–1.2)

## 2014-12-31 LAB — PSA, TOTAL AND FREE
PSA, Free Pct: 38 % (ref >25)
PSA, Free: 0.11 ng/mL
PSA: 0.29 ng/mL (ref <=4.00)

## 2014-12-31 LAB — HEMOGLOBIN A1C
Hgb A1c MFr Bld: 5.9 % — ABNORMAL HIGH (ref <5.7)
Mean Plasma Glucose: 123 mg/dL — ABNORMAL HIGH (ref <117)

## 2015-01-02 LAB — TESTOSTERONE, FREE, TOTAL, SHBG
Sex Hormone Binding: 33 nmol/L (ref 10–50)
Testosterone, Free: 25.3 pg/mL — ABNORMAL LOW (ref 47.0–244.0)
Testosterone-% Free: 1.9 % (ref 1.6–2.9)
Testosterone: 136 ng/dL — ABNORMAL LOW (ref 300–890)

## 2015-01-26 ENCOUNTER — Telehealth: Payer: Self-pay | Admitting: *Deleted

## 2015-01-26 ENCOUNTER — Other Ambulatory Visit: Payer: Self-pay | Admitting: Sports Medicine

## 2015-01-26 DIAGNOSIS — E349 Endocrine disorder, unspecified: Secondary | ICD-10-CM

## 2015-01-26 NOTE — Telephone Encounter (Signed)
Testosterone labs ordered & faxed to [facility].

## 2015-01-30 LAB — TESTOSTERONE, FREE, DIRECT: Free Testosterone, Direct: 53.3 pg/mL — ABNORMAL HIGH (ref 3.8–34.2)

## 2015-01-31 LAB — TESTOSTERONE, FREE, TOTAL, SHBG
Sex Hormone Binding: 25 nmol/L (ref 10–50)
Testosterone, Free: 315.2 pg/mL — ABNORMAL HIGH (ref 47.0–244.0)
Testosterone-% Free: 2.8 % (ref 1.6–2.9)
Testosterone: 1116 ng/dL — ABNORMAL HIGH (ref 300–890)

## 2015-06-27 ENCOUNTER — Other Ambulatory Visit: Payer: Self-pay

## 2015-06-27 DIAGNOSIS — E349 Endocrine disorder, unspecified: Secondary | ICD-10-CM

## 2015-06-27 MED ORDER — TESTOSTERONE CYPIONATE 200 MG/ML IM SOLN: 200.0000 mg | INTRAMUSCULAR | Status: DC

## 2015-07-07 ENCOUNTER — Ambulatory Visit (INDEPENDENT_AMBULATORY_CARE_PROVIDER_SITE_OTHER): Payer: BLUE CROSS/BLUE SHIELD | Admitting: Sports Medicine

## 2015-07-07 ENCOUNTER — Encounter: Payer: Self-pay | Admitting: Sports Medicine

## 2015-07-07 VITALS — BP 151/99 | HR 57 | Temp 97.9°F | Resp 18 | Wt 261.1 lb

## 2015-07-07 DIAGNOSIS — I10 Essential (primary) hypertension: Secondary | ICD-10-CM | POA: Diagnosis not present

## 2015-07-07 DIAGNOSIS — Z0000 Encounter for general adult medical examination without abnormal findings: Secondary | ICD-10-CM

## 2015-07-07 DIAGNOSIS — M1A0620 Idiopathic chronic gout, left knee, without tophus (tophi): Secondary | ICD-10-CM | POA: Diagnosis not present

## 2015-07-07 DIAGNOSIS — E291 Testicular hypofunction: Secondary | ICD-10-CM

## 2015-07-07 DIAGNOSIS — E349 Endocrine disorder, unspecified: Secondary | ICD-10-CM

## 2015-07-07 DIAGNOSIS — G4719 Other hypersomnia: Secondary | ICD-10-CM

## 2015-07-07 HISTORY — DX: Encounter for general adult medical examination without abnormal findings: Z00.00

## 2015-07-07 MED ORDER — VALSARTAN-HYDROCHLOROTHIAZIDE 320-25 MG PO TABS: 1.0000 | ORAL_TABLET | Freq: Every day | ORAL | Status: DC

## 2015-07-07 MED ORDER — SUVOREXANT 10 MG PO TABS: 1.0000 | ORAL_TABLET | Freq: Every day | ORAL | Status: DC

## 2015-07-07 NOTE — Assessment & Plan Note (Addendum)
Persistently elevated with significant lower extremity edema, continue lisinopril/hydrochlorothiazide, metoprolol, amlodipine, improved on recheck but switching lisinopril/HCTZ to valsartan/HCTZ

## 2015-07-07 NOTE — Progress Notes (Signed)
  Subjective:    CC: Complete physical  HPI:  Hypertension: Improved on recheck but still high  Hypogonadism: Recent shot 6 days ago, doing well.  Insomnia: Mother recently died, was not getting much sleep as he was caring for her during her last few days. Would like something to help with sleep.  Past medical history, Surgical history, Family history not pertinant except as noted below, Social history, Allergies, and medications have been entered into the medical record, reviewed, and no changes needed.   Review of Systems: No headache, visual changes, nausea, vomiting, diarrhea, constipation, dizziness, abdominal pain, skin rash, fevers, chills, night sweats, swollen lymph nodes, weight loss, chest pain, body aches, joint swelling, muscle aches, shortness of breath, mood changes, visual or auditory hallucinations.  Objective:    General: Well Developed, well nourished, and in no acute distress.  Neuro: Alert and oriented x3, extra-ocular muscles intact, sensation grossly intact. Cranial nerves II through XII are intact, motor, sensory, and coordinative functions are all intact. HEENT: Normocephalic, atraumatic, pupils equal round reactive to light, neck supple, no masses, no lymphadenopathy, thyroid nonpalpable. Oropharynx, nasopharynx, external ear canals are unremarkable. Skin: Warm and dry, no rashes noted.  Cardiac: Regular rate and rhythm, no murmurs rubs or gallops.  Respiratory: Clear to auscultation bilaterally. Not using accessory muscles, speaking in full sentences.  Abdominal: Soft, nontender, nondistended, positive bowel sounds, no masses, no organomegaly.  Musculoskeletal: Shoulder, elbow, wrist, hip, knee, ankle stable, and with full range of motion.  Impression and Recommendations:    The patient was counselled, risk factors were discussed, anticipatory guidance given.

## 2015-07-07 NOTE — Assessment & Plan Note (Signed)
Rechecking uric acid levels. 

## 2015-07-07 NOTE — Assessment & Plan Note (Signed)
Adding Belsomra, there is significant degree of insomnia

## 2015-07-07 NOTE — Assessment & Plan Note (Signed)
Routine physical as above. 

## 2015-07-07 NOTE — Assessment & Plan Note (Signed)
Most recent testosterone injection was 6 days ago, rechecking testosterone

## 2015-07-08 LAB — HEMOGLOBIN A1C
Hgb A1c MFr Bld: 6.5 % — ABNORMAL HIGH (ref <5.7)
Mean Plasma Glucose: 140 mg/dL — ABNORMAL HIGH (ref <117)

## 2015-07-08 LAB — CBC
HCT: 54.2 % — ABNORMAL HIGH (ref 39.0–52.0)
Hemoglobin: 18 g/dL — ABNORMAL HIGH (ref 13.0–17.0)
MCH: 28.6 pg (ref 26.0–34.0)
MCHC: 33.2 g/dL (ref 30.0–36.0)
MCV: 86 fL (ref 78.0–100.0)
MPV: 12.3 fL (ref 8.6–12.4)
Platelets: 223 10*3/uL (ref 150–400)
RBC: 6.3 MIL/uL — ABNORMAL HIGH (ref 4.22–5.81)
RDW: 18.4 % — ABNORMAL HIGH (ref 11.5–15.5)
WBC: 9.8 K/uL (ref 4.0–10.5)

## 2015-07-08 LAB — COMPREHENSIVE METABOLIC PANEL WITH GFR
ALT: 30 U/L (ref 9–46)
AST: 28 U/L (ref 10–40)
Albumin: 3.9 g/dL (ref 3.6–5.1)
Alkaline Phosphatase: 54 U/L (ref 40–115)
Calcium: 9.7 mg/dL (ref 8.6–10.3)
Potassium: 4.5 mmol/L (ref 3.5–5.3)
Total Bilirubin: 0.8 mg/dL (ref 0.2–1.2)

## 2015-07-08 LAB — LIPID PANEL
Cholesterol: 162 mg/dL (ref 125–200)
HDL: 37 mg/dL — ABNORMAL LOW (ref >=40)
LDL Cholesterol: 108 mg/dL (ref <130)
Total CHOL/HDL Ratio: 4.4 Ratio (ref <=5.0)
Triglycerides: 83 mg/dL (ref <150)
VLDL: 17 mg/dL (ref <30)

## 2015-07-08 LAB — TSH: TSH: 1.491 u[IU]/mL (ref 0.350–4.500)

## 2015-07-08 LAB — COMPREHENSIVE METABOLIC PANEL
BUN: 19 mg/dL (ref 7–25)
CO2: 25 mmol/L (ref 20–31)
Chloride: 100 mmol/L (ref 98–110)
Creat: 1.47 mg/dL — ABNORMAL HIGH (ref 0.60–1.35)
Glucose, Bld: 79 mg/dL (ref 65–99)
Sodium: 141 mmol/L (ref 135–146)
Total Protein: 6.9 g/dL (ref 6.1–8.1)

## 2015-07-08 LAB — URIC ACID: Uric Acid, Serum: 5.5 mg/dL (ref 4.0–7.8)

## 2015-07-10 LAB — TESTOSTERONE, FREE, TOTAL, SHBG
Sex Hormone Binding: 24 nmol/L (ref 10–50)
Testosterone, Free: 217.6 pg/mL (ref 47.0–244.0)
Testosterone-% Free: 2.7 % (ref 1.6–2.9)
Testosterone: 808 ng/dL (ref 250–827)

## 2015-07-11 LAB — TESTOS,TOTAL,FREE AND SHBG (FEMALE)
Sex Hormone Binding Glob.: 26 nmol/L (ref 10–50)
Testosterone, Free: 394.4 pg/mL — ABNORMAL HIGH (ref 35.0–155.0)
Testosterone,Total,LC/MS/MS: 1972 ng/dL — ABNORMAL HIGH (ref 250–1100)

## 2015-07-21 ENCOUNTER — Ambulatory Visit (INDEPENDENT_AMBULATORY_CARE_PROVIDER_SITE_OTHER): Payer: BLUE CROSS/BLUE SHIELD | Admitting: Sports Medicine

## 2015-07-21 ENCOUNTER — Encounter: Payer: Self-pay | Admitting: Sports Medicine

## 2015-07-21 VITALS — BP 132/86 | HR 65 | Resp 16 | Wt 272.0 lb

## 2015-07-21 DIAGNOSIS — G4719 Other hypersomnia: Secondary | ICD-10-CM | POA: Diagnosis not present

## 2015-07-21 DIAGNOSIS — I10 Essential (primary) hypertension: Secondary | ICD-10-CM

## 2015-07-21 DIAGNOSIS — G4726 Circadian rhythm sleep disorder, shift work type: Secondary | ICD-10-CM | POA: Diagnosis not present

## 2015-07-21 NOTE — Progress Notes (Signed)
  Subjective:    CC: Follow-up  HPI: Hypertension: Well controlled.  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 Also was effective in keeping him sleep however he was a bit groggy in the morning and 10 mg.  Past medical history, Surgical history, Family history not pertinant except as noted below, Social history, Allergies, and medications have been entered into the medical record, reviewed, and no changes needed.   Review of Systems: No fevers, chills, night sweats, weight loss, chest pain, or shortness of breath.   Objective:    General: Well Developed, well nourished, and in no acute distress.  Neuro: Alert and oriented x3, extra-ocular muscles intact, sensation grossly intact.  HEENT: Normocephalic, atraumatic, pupils equal round reactive to light, neck supple, no masses, no lymphadenopathy, thyroid nonpalpable.  Skin: Warm and dry, no rashes. Cardiac: Regular rate and rhythm, no murmurs rubs or gallops, no lower extremity edema.  Respiratory: Clear to auscultation bilaterally. Not using accessory muscles, speaking in full sentences.  Impression and Recommendations:

## 2015-07-21 NOTE — Assessment & Plan Note (Signed)
Well-controlled now on metoprolol, valsartan/hydrochlorothiazide, amlodipine. No changes.

## 2015-07-21 NOTE — Assessment & Plan Note (Signed)
Goes to bed at 7 PM and wakes up at 2 AM every day, doing extremely well with current 10 mg Belsomra but still a bit groggy when he wakes up. He will switch to one half tab Belsomra at night and let me know how things go.

## 2015-08-02 ENCOUNTER — Telehealth: Payer: Self-pay

## 2015-08-02 NOTE — Telephone Encounter (Signed)
Left message advising of recommendations.  

## 2015-08-02 NOTE — Telephone Encounter (Signed)
It's a coincidence and likely the weather changes, please ask him to check his blood pressures at home, and also use a bit of Vaseline in the nasal nares.

## 2015-08-02 NOTE — Telephone Encounter (Signed)
Patient states he has had nose bleeds in the mornings the last few of days. He thinks it is the blood pressure medication that is causing the nose bleeds. I advised this was not a common side effect to Valsartan-HCTZ. Please advise.

## 2015-09-29 ENCOUNTER — Encounter: Payer: Self-pay | Admitting: Sports Medicine

## 2015-09-29 ENCOUNTER — Ambulatory Visit (INDEPENDENT_AMBULATORY_CARE_PROVIDER_SITE_OTHER): Payer: BLUE CROSS/BLUE SHIELD | Admitting: Sports Medicine

## 2015-09-29 VITALS — BP 147/87 | HR 63 | Resp 18 | Wt 269.0 lb

## 2015-09-29 DIAGNOSIS — J0100 Acute maxillary sinusitis, unspecified: Secondary | ICD-10-CM

## 2015-09-29 DIAGNOSIS — R0602 Shortness of breath: Secondary | ICD-10-CM | POA: Insufficient documentation

## 2015-09-29 MED ORDER — AZITHROMYCIN 250 MG PO TABS: ORAL_TABLET | ORAL | Status: DC

## 2015-09-29 MED ORDER — PREDNISONE 50 MG PO TABS: 50.0000 mg | ORAL_TABLET | Freq: Every day | ORAL | Status: DC

## 2015-09-29 NOTE — Progress Notes (Signed)
  Subjective:    CC: Sinus infection  HPI: For the past week this pleasant 50 year old male has had pain, pressure, moderate, persistent without radiation over the right maxillary sinus with purulent nasal discharge, he has a trip coming up and would really need to feel better.  Past medical history, Surgical history, Family history not pertinant except as noted below, Social history, Allergies, and medications have been entered into the medical record, reviewed, and no changes needed.   Review of Systems: No fevers, chills, night sweats, weight loss, chest pain, or shortness of breath.   Objective:    General: Well Developed, well nourished, and in no acute distress.  Neuro: Alert and oriented x3, extra-ocular muscles intact, sensation grossly intact.  HEENT: Normocephalic, atraumatic, pupils equal round reactive to light, neck supple, no masses, no lymphadenopathy, thyroid nonpalpable. Oropharynx, nasopharynx, ear canals unremarkable, minimal tenderness to palpation over the right maxillary sinus. Skin: Warm and dry, no rashes. Cardiac: Regular rate and rhythm, no murmurs rubs or gallops, no lower extremity edema.  Respiratory: Clear to auscultation bilaterally. Not using accessory muscles, speaking in full sentences.  Impression and Recommendations:   I spent 25 minutes with this patient, greater than 50% was face-to-face time counseling regarding the above diagnoses

## 2015-09-29 NOTE — Assessment & Plan Note (Signed)
Prednisone, azithromycin, return as needed

## 2015-11-06 ENCOUNTER — Other Ambulatory Visit: Payer: Self-pay

## 2015-11-06 MED ORDER — INDOMETHACIN 50 MG PO CAPS: 50.0000 mg | ORAL_CAPSULE | Freq: Two times a day (BID) | ORAL | Status: DC

## 2016-02-04 ENCOUNTER — Other Ambulatory Visit: Payer: Self-pay | Admitting: Sports Medicine

## 2016-02-04 DIAGNOSIS — E349 Endocrine disorder, unspecified: Secondary | ICD-10-CM

## 2016-02-04 DIAGNOSIS — I10 Essential (primary) hypertension: Secondary | ICD-10-CM

## 2016-02-04 DIAGNOSIS — M1000 Idiopathic gout, unspecified site: Secondary | ICD-10-CM

## 2016-02-05 ENCOUNTER — Telehealth: Payer: Self-pay | Admitting: Sports Medicine

## 2016-02-05 DIAGNOSIS — I10 Essential (primary) hypertension: Secondary | ICD-10-CM

## 2016-02-05 MED ORDER — VALSARTAN-HYDROCHLOROTHIAZIDE 320-25 MG PO TABS: 1.0000 | ORAL_TABLET | Freq: Every day | ORAL | 3 refills | Status: DC

## 2016-02-05 NOTE — Telephone Encounter (Signed)
Please call Jonathan Oliver and let him know NOT TO FILL lisinopril/HCTZ.  He should be on Valsartan/HCTZ, amlodipine, and metoprolol.

## 2016-02-05 NOTE — Telephone Encounter (Signed)
Pt.notified

## 2016-03-17 ENCOUNTER — Other Ambulatory Visit: Payer: Self-pay | Admitting: Sports Medicine

## 2016-03-17 DIAGNOSIS — I10 Essential (primary) hypertension: Secondary | ICD-10-CM

## 2016-03-29 ENCOUNTER — Ambulatory Visit (INDEPENDENT_AMBULATORY_CARE_PROVIDER_SITE_OTHER): Payer: BLUE CROSS/BLUE SHIELD

## 2016-03-29 ENCOUNTER — Encounter: Payer: Self-pay | Admitting: Sports Medicine

## 2016-03-29 ENCOUNTER — Ambulatory Visit (INDEPENDENT_AMBULATORY_CARE_PROVIDER_SITE_OTHER): Payer: BLUE CROSS/BLUE SHIELD | Admitting: Sports Medicine

## 2016-03-29 DIAGNOSIS — R0602 Shortness of breath: Secondary | ICD-10-CM

## 2016-03-29 LAB — BRAIN NATRIURETIC PEPTIDE: Brain Natriuretic Peptide: 28.6 pg/mL (ref <100)

## 2016-03-29 MED ORDER — PREDNISONE 50 MG PO TABS: ORAL_TABLET | ORAL | 0 refills | Status: DC

## 2016-03-29 MED ORDER — AZITHROMYCIN 250 MG PO TABS: ORAL_TABLET | ORAL | 0 refills | Status: DC

## 2016-03-29 NOTE — Assessment & Plan Note (Signed)
Likely represents a viral lower respiratory infection, there were some crackles in the right upper lobe. Prednisone, azithromycin, chest x-ray. He did have a few episodes of orthopnea so we will add a BNP, and d-dimer.

## 2016-03-29 NOTE — Progress Notes (Signed)
  Subjective:    CC: Shortness of breath  HPI: This is a pleasant 50 year old male, previously healthy, with a several day history of intermittent shortness of breath, runny nose, mild cough. Symptoms are moderate, persistent, no leg swelling, no chest pain. No constitutional symptoms.  Past medical history:  Negative.  See flowsheet/record as well for more information.  Surgical history: Negative.  See flowsheet/record as well for more information.  Family history: Negative.  See flowsheet/record as well for more information.  Social history: Negative.  See flowsheet/record as well for more information.  Allergies, and medications have been entered into the medical record, reviewed, and no changes needed.   Review of Systems: No fevers, chills, night sweats, weight loss, chest pain, or shortness of breath.   Objective:    General: Well Developed, well nourished, and in no acute distress.  Neuro: Alert and oriented x3, extra-ocular muscles intact, sensation grossly intact.  HEENT: Normocephalic, atraumatic, pupils equal round reactive to light, neck supple, no masses, no lymphadenopathy, thyroid nonpalpable. Oropharynx, nasopharynx, ear canals are unremarkable. Skin: Warm and dry, no rashes. Cardiac: Regular rate and rhythm, no murmurs rubs or gallops, no lower extremity edema.  Respiratory: Coarse lung sounds and crackles in the right upper lobe. Not using accessory muscles, speaking in full sentences.  Chest x-ray personally reviewed and is negative.  Impression and Recommendations:    Shortness of breath Likely represents a viral lower respiratory infection, there were some crackles in the right upper lobe. Prednisone, azithromycin, chest x-ray. He did have a few episodes of orthopnea so we will add a BNP, and d-dimer.

## 2016-03-31 LAB — D-DIMER, QUANTITATIVE: D-Dimer, Quant: 0.37 mcg/mL FEU (ref <0.50)

## 2016-04-07 ENCOUNTER — Other Ambulatory Visit: Payer: Self-pay | Admitting: Sports Medicine

## 2016-04-12 ENCOUNTER — Ambulatory Visit: Payer: BLUE CROSS/BLUE SHIELD | Admitting: Sports Medicine

## 2016-04-28 ENCOUNTER — Other Ambulatory Visit: Payer: Self-pay | Admitting: Sports Medicine

## 2016-04-28 DIAGNOSIS — N5201 Erectile dysfunction due to arterial insufficiency: Secondary | ICD-10-CM

## 2016-05-08 ENCOUNTER — Encounter: Payer: Self-pay | Admitting: Sports Medicine

## 2016-05-08 ENCOUNTER — Ambulatory Visit (INDEPENDENT_AMBULATORY_CARE_PROVIDER_SITE_OTHER): Payer: BLUE CROSS/BLUE SHIELD | Admitting: Sports Medicine

## 2016-05-08 DIAGNOSIS — G4489 Other headache syndrome: Secondary | ICD-10-CM | POA: Insufficient documentation

## 2016-05-08 DIAGNOSIS — G44209 Tension-type headache, unspecified, not intractable: Secondary | ICD-10-CM

## 2016-05-08 DIAGNOSIS — R519 Headache, unspecified: Secondary | ICD-10-CM | POA: Insufficient documentation

## 2016-05-08 MED ORDER — MELOXICAM 15 MG PO TABS: ORAL_TABLET | ORAL | 3 refills | Status: DC

## 2016-05-08 MED ORDER — KETOROLAC TROMETHAMINE 30 MG/ML IJ SOLN
30.0000 mg | Freq: Once | INTRAMUSCULAR | Status: AC
  Administered 2016-05-08: 30 mg via INTRAMUSCULAR

## 2016-05-08 MED ORDER — DEXAMETHASONE SODIUM PHOSPHATE 4 MG/ML IJ SOLN
4.0000 mg | Freq: Once | INTRAMUSCULAR | Status: AC
  Administered 2016-05-08: 4 mg via INTRAMUSCULAR

## 2016-05-08 NOTE — Progress Notes (Signed)
  Subjective:    CC: Headache  HPI: This is a pleasant 50 year old male comes in with a several day history of headache, frontal, wrapping around the sides of the head, described as a dull ache without photophobia, phonophobia, nausea. No nasal congestion, fevers, chills, only minimal visual blurriness worse with seeing up close, no scotomata. No aura. No localizing neurologic symptoms.  Past medical history:  Negative.  See flowsheet/record as well for more information.  Surgical history: Negative.  See flowsheet/record as well for more information.  Family history: Negative.  See flowsheet/record as well for more information.  Social history: Negative.  See flowsheet/record as well for more information.  Allergies, and medications have been entered into the medical record, reviewed, and no changes needed.   Review of Systems: No fevers, chills, night sweats, weight loss, chest pain, or shortness of breath.   Objective:    General: Well Developed, well nourished, and in no acute distress.  Neuro: Alert and oriented x3, extra-ocular muscles intact, sensation grossly intact. Cranial nerve II through XII are intact, motor, sensory, coordinative functions are all intact. HEENT: Normocephalic, atraumatic, pupils equal round reactive to light, neck supple, no masses, no lymphadenopathy, thyroid nonpalpable.  Skin: Warm and dry, no rashes. Cardiac: Regular rate and rhythm, no murmurs rubs or gallops, no lower extremity edema.  Respiratory: Clear to auscultation bilaterally. Not using accessory muscles, speaking in full sentences.  Impression and Recommendations:    Tension type headache Features are more consistent with tension headache rather than migraine. Patient has simply been using Tylenol and ibuprofen. Toradol, Decadron, meloxicam. Return to see me if no better in a couple of weeks. He is also developing some farsightedness, and I have recommended seeing an optometrist.  I spent 25  minutes with this patient, greater than 50% was face-to-face time counseling regarding the above diagnoses

## 2016-05-08 NOTE — Assessment & Plan Note (Signed)
Features are more consistent with tension headache rather than migraine. Patient has simply been using Tylenol and ibuprofen. Toradol, Decadron, meloxicam. Return to see me if no better in a couple of weeks. He is also developing some farsightedness, and I have recommended seeing an optometrist.

## 2016-05-08 NOTE — Patient Instructions (Signed)

## 2016-05-22 ENCOUNTER — Telehealth: Payer: Self-pay

## 2016-05-24 ENCOUNTER — Encounter: Payer: Self-pay | Admitting: Sports Medicine

## 2016-05-24 ENCOUNTER — Ambulatory Visit (INDEPENDENT_AMBULATORY_CARE_PROVIDER_SITE_OTHER): Payer: BLUE CROSS/BLUE SHIELD | Admitting: Sports Medicine

## 2016-05-24 VITALS — BP 131/82 | HR 66 | Resp 18 | Wt 245.0 lb

## 2016-05-24 DIAGNOSIS — H524 Presbyopia: Secondary | ICD-10-CM

## 2016-05-24 DIAGNOSIS — H5213 Myopia, bilateral: Secondary | ICD-10-CM | POA: Insufficient documentation

## 2016-05-24 DIAGNOSIS — G44211 Episodic tension-type headache, intractable: Secondary | ICD-10-CM

## 2016-05-24 MED ORDER — DEXAMETHASONE SODIUM PHOSPHATE 4 MG/ML IJ SOLN
4.0000 mg | Freq: Once | INTRAMUSCULAR | Status: AC
  Administered 2016-05-24: 4 mg via INTRAMUSCULAR

## 2016-05-24 MED ORDER — KETOROLAC TROMETHAMINE 30 MG/ML IJ SOLN
30.0000 mg | Freq: Once | INTRAMUSCULAR | Status: AC
  Administered 2016-05-24: 30 mg via INTRAMUSCULAR

## 2016-05-24 NOTE — Progress Notes (Addendum)
Subjective:    Jonathan Laidig. is a 50 y.o. male who presents for follow-up of tension headaches. The patient denies decreased physical activity, depression, loss of balance, muscle weakness, numbness of extremities, speech difficulties, vomiting in the early morning and worsening school/work performance. Also denies neurocognitive and psychiatric symptoms.  Has not had a headache-free day in over 3 weeks. States he goes to bed with and wakes up with a headache daily. Pain waxes and wanes, but never resolves completely. Pain is not relieved by Ibuprofen 600mg  and Tylenol 1000mg . Originally was experiencing frontotemporal headaches and now in a band distribution. States his head "feels sore" and feels some relief when he rubs his head. On Wednesday states he felt lightheaded and had tunnel vision and sweats. States he drinks 32 oz. Of water and 2 x 32 oz. Of Gatorade at work. Experiences mild photosensitivity when he is under white lights at [facility]. No nausea or vomiting. In the last two months he has had a couple episodes of blurred distance vision. Does not wear corrective lenses. Has not been to an eye doctor in years.   Blood pressure has been controlled with Norvasc and Lopressor.  The following portions of the patient's history were reviewed and updated as appropriate: allergies, current medications, past medical history and problem list.  Review of Systems Pertinent items are noted in HPI.    Objective:    General appearance: alert, cooperative and no distress Head: Normocephalic, without obvious abnormality, atraumatic, sinuses nontender to percussion Eyes: conjunctivae/corneas clear. PERRL, EOM's intact. Fundi benign. Visual Acuity: R 20/25, L 20/50, B/L 20/25 Ears: normal TM's and external ear canals both ears Extremities: extremities normal, atraumatic, no cyanosis or edema Neurologic: Alert and oriented X 3, normal strength and tone. Normal symmetric reflexes. Normal coordination  and gait Cranial nerves: normal Motor: grossly normal Reflexes: 2+ and symmetric Coordination: cerebellar arm drift absent bilaterally Gait: Normal    Vitals:   05/24/16 1458  BP: 131/82  Pulse: 66  Resp: 18     Assessment & Plan      1. Intractable episodic tension-type headache -IM Toradol & Decadron given in clinic for abortive therapy. Patient has failed analgesics and conservative treatments. -referral to neurology made -MRI brain ordered as well as BMP to check renal function  2. Bilateral presbyopia -patient instructed to make an appointment with an eye doctor as soon as possible  Patient education provided. Follow-up if symptoms worsen or fail to improve.  I spent 40 minutes with this patient, greater than 50 percent was face-to-face time and counseling regarding the above diagnoses.

## 2016-05-24 NOTE — Patient Instructions (Addendum)
Please make an appointment with an eye doctor. Follow-up with Neurology Return if symptoms worsen or do not improve  Tension Headache A tension headache is a feeling of pain, pressure, or aching that is often felt over the front and sides of the head. The pain can be dull, or it can feel tight (constricting). Tension headaches are not normally associated with nausea or vomiting, and they do not get worse with physical activity. Tension headaches can last from 30 minutes to several days. This is the most common type of headache. CAUSES The exact cause of this condition is not known. Tension headaches often begin after stress, anxiety, or depression. Other triggers may include:  Alcohol.  Too much caffeine, or caffeine withdrawal.  Respiratory infections, such as colds, flu, or sinus infections.  Dental problems or teeth clenching.  Fatigue.  Holding your head and neck in the same position for a long period of time, such as while using a computer.  Smoking. SYMPTOMS Symptoms of this condition include:  A feeling of pressure around the head.  Dull, aching head pain.  Pain felt over the front and sides of the head.  Tenderness in the muscles of the head, neck, and shoulders. DIAGNOSIS This condition may be diagnosed based on your symptoms and a physical exam. Tests may be done, such as a CT scan or an MRI of your head. These tests may be done if your symptoms are severe or unusual. TREATMENT This condition may be treated with lifestyle changes and medicines to help relieve symptoms. HOME CARE INSTRUCTIONS Managing Pain   Take over-the-counter and prescription medicines only as told by your health care provider.  Lie down in a dark, quiet room when you have a headache.  If directed, apply ice to the head and neck area:  Put ice in a plastic bag.  Place a towel between your skin and the bag.  Leave the ice on for 20 minutes, 2-3 times per day.  Use a heating pad or a hot  shower to apply heat to the head and neck area as told by your health care provider. Eating and Drinking   Eat meals on a regular schedule.  Limit alcohol use.  Decrease your caffeine intake, or stop using caffeine. General Instructions   Keep all follow-up visits as told by your health care provider. This is important.  Keep a headache journal to help find out what may trigger your headaches. For example, write down:  What you eat and drink.  How much sleep you get.  Any change to your diet or medicines.  Try massage or other relaxation techniques.  Limit stress.  Sit up straight, and avoid tensing your muscles.  Do not use tobacco products, including cigarettes, chewing tobacco, or e-cigarettes. If you need help quitting, ask your health care provider.  Exercise regularly as told by your health care provider.  Get 7-9 hours of sleep, or the amount recommended by your health care provider. SEEK MEDICAL CARE IF:  Your symptoms are not helped by medicine.  You have a headache that is different from what you normally experience.  You have nausea or you vomit.  You have a fever. SEEK IMMEDIATE MEDICAL CARE IF:  Your headache becomes severe.  You have repeated vomiting.  You have a stiff neck.  You have a loss of vision.  You have problems with speech.  You have pain in your eye or ear.  You have muscular weakness or loss of muscle control.  You lose your balance or you have trouble walking.  You feel faint or you pass out.  You have confusion. This information is not intended to replace advice given to you by your health care provider. Make sure you discuss any questions you have with your health care provider. Document Released: 06/10/2005 Document Revised: 03/01/2015 Document Reviewed: 10/03/2014 Elsevier Interactive Patient Education  2017 [employer].

## 2016-05-25 LAB — BASIC METABOLIC PANEL
BUN: 18 mg/dL (ref 7–25)
CALCIUM: 9.6 mg/dL (ref 8.6–10.3)
CHLORIDE: 103 mmol/L (ref 98–110)
CO2: 30 mmol/L (ref 20–31)
CREATININE: 1.34 mg/dL (ref 0.60–1.35)
Glucose, Bld: 83 mg/dL (ref 65–99)
Potassium: 4.3 mmol/L (ref 3.5–5.3)
SODIUM: 142 mmol/L (ref 135–146)

## 2016-06-03 ENCOUNTER — Ambulatory Visit (INDEPENDENT_AMBULATORY_CARE_PROVIDER_SITE_OTHER): Payer: BLUE CROSS/BLUE SHIELD | Admitting: Sports Medicine

## 2016-06-03 ENCOUNTER — Encounter: Payer: Self-pay | Admitting: Sports Medicine

## 2016-06-03 DIAGNOSIS — G44211 Episodic tension-type headache, intractable: Secondary | ICD-10-CM

## 2016-06-03 DIAGNOSIS — H5213 Myopia, bilateral: Secondary | ICD-10-CM | POA: Diagnosis not present

## 2016-06-03 MED ORDER — TOPIRAMATE 50 MG PO TABS: ORAL_TABLET | ORAL | 3 refills | Status: DC

## 2016-06-03 MED ORDER — AMITRIPTYLINE HCL 25 MG PO TABS: ORAL_TABLET | ORAL | 3 refills | Status: DC

## 2016-06-03 NOTE — Assessment & Plan Note (Signed)
Question mixed type headache, migrainous versus tension. He does have mild photophobia as well as left temporal type symptoms suggestive of a migraine. Adding Topamax, low-dose amitriptyline at bedtime. He also has some myopia which after correcting should improve his headaches as well. Return in one month.

## 2016-06-03 NOTE — Progress Notes (Signed)
  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 No other masses that could contribute to his headache. Headache is often bitemporal but also left sided, with mild photophobia, no phonophobia or nausea. It is constant. He did have some myopia on a previous office visit, and agrees to see an optometrist to discuss corrective eyewear.  Past medical history:  Negative.  See flowsheet/record as well for more information.  Surgical history: Negative.  See flowsheet/record as well for more information.  Family history: Negative.  See flowsheet/record as well for more information.  Social history: Negative.  See flowsheet/record as well for more information.  Allergies, and medications have been entered into the medical record, reviewed, and no changes needed.   Review of Systems: No fevers, chills, night sweats, weight loss, chest pain, or shortness of breath.   Objective:    General: Well Developed, well nourished, and in no acute distress.  Neuro: Alert and oriented x3, extra-ocular muscles intact, sensation grossly intact.  HEENT: Normocephalic, atraumatic, pupils equal round reactive to light, neck supple, no masses, no lymphadenopathy, thyroid nonpalpable.  Skin: Warm and dry, no rashes. Cardiac: Regular rate and rhythm, no murmurs rubs or gallops, no lower extremity edema.  Respiratory: Clear to auscultation bilaterally. Not using accessory muscles, speaking in full sentences.  Impression and Recommendations:    Myopia of both eyes 20/25 on the right and 20/50 on the left, referral to local optometrist. I think this is likely a contributor to his headaches. I am going to add amitriptyline and Topamax to help his daily migrainous type symptoms however I do think correcting his vision will be the principal  factor.  Tension type headache Question mixed type headache, migrainous versus tension. He does have mild photophobia as well as left temporal type symptoms suggestive of a migraine. Adding Topamax, low-dose amitriptyline at bedtime. He also has some myopia which after correcting should improve his headaches as well. Return in one month.  I spent 25 minutes with this patient, greater than 50% was face-to-face time counseling regarding the above diagnoses

## 2016-06-03 NOTE — Assessment & Plan Note (Signed)
20/25 on the right and 20/50 on the left, referral to local optometrist. I think this is likely a contributor to his headaches. I am going to add amitriptyline and Topamax to help his daily migrainous type symptoms however I do think correcting his vision will be the principal factor.

## 2016-07-17 ENCOUNTER — Ambulatory Visit (INDEPENDENT_AMBULATORY_CARE_PROVIDER_SITE_OTHER): Payer: BLUE CROSS/BLUE SHIELD | Admitting: Neurology

## 2016-07-17 ENCOUNTER — Encounter: Payer: Self-pay | Admitting: Neurology

## 2016-07-17 VITALS — BP 132/90 | HR 65 | Ht 77.0 in | Wt 260.0 lb

## 2016-07-17 DIAGNOSIS — G4489 Other headache syndrome: Secondary | ICD-10-CM

## 2016-07-17 NOTE — Progress Notes (Signed)
Reason for visit: Headache  Referring physician: Dr. Frutoso Schatz Jonathan Oliver. is a 51 y.o. male  History of present illness:  Jonathan Oliver is a 51 year old right-handed white male with a history of onset of headaches that began in October 2017. He claims that he has never really had headaches throughout his life, he does not have a family history of headache with exception that his daughter has migraine headache. The patient had spontaneous onset of bitemporal headaches that began in October and were daily in nature. The headaches were associated with a dull achy pain that was present at all times, but varied in severity. He had no nausea or vomiting, photophobia or phonophobia, neck stiffness, or dizziness with the headache. The patient was initially placed on amitriptyline, and eventually Topamax was added. The patient indicated that he gained very little benefit with the headaches. The headaches began to spontaneously resolve around Christmas of 2017. The patient now is having only 2 or 3 brief headaches a week and may last 30 minutes to 2 hours, and these headaches are not disabling. He does not require medications for the headache. He denies any medication changes around the time of onset of headache, he has not been under stress. He denies any allergy symptoms per se or sinus drainage. He has undergone a MRI scan of the brain showing evidence of an old lacunar infarct or cystic structure in the right caudate nucleus. Otherwise the MRI study was unremarkable. No sinus disease was noted. The MRI was done on 05/31/2016. The patient reports no numbness or weakness of the face, arms, or legs. He denies balance issues or difficulty controlling the bowels or the bladder. He is sent to this office for an evaluation. He denies intake of caffeinated products during the day.  Past Medical History:  Diagnosis Date  . Gout   . Headache   . Hypertension   . IFG (impaired fasting glucose)   . Kidney  stones   . MRSA (methicillin resistant Staphylococcus aureus)     Past Surgical History:  Procedure Laterality Date  . ELBOW SURGERY Right    x 2  . sinus surgey    . uvuloplasty for OSA      Family History  Problem Relation Age of Onset  . Cancer Mother     Marena Chancy - thinks it started in her lungs.  . Cancer Father     Unsure of type.  . Diabetes Maternal Grandfather   . Diabetes Maternal Aunt   . Hypertension Neg Hx     Social history:  reports that he has never smoked. He has never used smokeless tobacco. He reports that he drinks about 25.2 oz of alcohol per week . He reports that he does not use drugs.  Medications:  Prior to Admission medications   Medication Sig Start Date End Date Taking? Authorizing Provider  allopurinol (ZYLOPRIM) 300 MG tablet take 1 tablet by mouth once daily 02/05/16  Yes Silverio Decamp, MD  amLODipine (NORVASC) 10 MG tablet take 1 tablet by mouth once daily 02/05/16  Yes Silverio Decamp, MD  indomethacin (INDOCIN) 50 MG capsule Take 50 mg by mouth 2 (two) times daily with a meal.   Yes Historical Provider, MD  metoprolol (LOPRESSOR) 100 MG tablet take 2 tablets by mouth twice a day 03/18/16  Yes Silverio Decamp, MD  omeprazole (PRILOSEC) 40 MG capsule Take 1 capsule (40 mg total) by mouth at bedtime. 12/23/14  Yes Gwen Her  Dianah Field, MD  testosterone cypionate (DEPOTESTOSTERONE CYPIONATE) 200 MG/ML injection inject 1 milliliter intramuscularly EVERY 7 DAYS 02/05/16  Yes Silverio Decamp, MD  valsartan-hydrochlorothiazide (DIOVAN-HCT) 320-25 MG tablet Take 1 tablet by mouth daily. 02/05/16  Yes Silverio Decamp, MD     No Known Allergies  ROS:  Out of a complete 14 system review of symptoms, the patient complains only of the following symptoms, and all other reviewed systems are negative.  Snoring Diarrhea Headache  Blood pressure 132/90, pulse 65, height 6\' 5"  (1.956 m), weight 260 lb (117.9 kg).  Physical  Exam  General: The patient is alert and cooperative at the time of the examination.  Eyes: Pupils are equal, round, and reactive to light. Discs are flat bilaterally.  Neck: The neck is supple, no carotid bruits are noted.  Respiratory: The respiratory examination is clear.  Cardiovascular: The cardiovascular examination reveals a regular rate and rhythm, no obvious murmurs or rubs are noted.  Skin: Extremities are without significant edema.  Neurologic Exam  Mental status: The patient is alert and oriented x 3 at the time of the examination. The patient has apparent normal recent and remote memory, with an apparently normal attention span and concentration ability.  Cranial nerves: Facial symmetry is present. There is good sensation of the face to pinprick and soft touch bilaterally. The strength of the facial muscles and the muscles to head turning and shoulder shrug are normal bilaterally. Speech is well enunciated, no aphasia or dysarthria is noted. Extraocular movements are full. Visual fields are full. The tongue is midline, and the patient has symmetric elevation of the soft palate. No obvious hearing deficits are noted.  Motor: The motor testing reveals 5 over 5 strength of all 4 extremities. Good symmetric motor tone is noted throughout.  Sensory: Sensory testing is intact to pinprick, soft touch, vibration sensation, and position sense on all 4 extremities. No evidence of extinction is noted.  Coordination: Cerebellar testing reveals good finger-nose-finger and heel-to-shin bilaterally.  Gait and station: Gait is normal. Tandem gait is normal. Romberg is negative. No drift is seen.  Reflexes: Deep tendon reflexes are symmetric and normal bilaterally. Toes are downgoing bilaterally.   Assessment/Plan:  1. History of headache  The patient has a normal neurologic examination. The patient gives a history of onset daily headaches without any definite activating factors, the  headaches have subsequently resolved to some degree, he is only having a few headaches a week that are not disabling. We will follow the patient conservatively at this point, if the headaches recur, we may get him back on medication such as gabapentin or Lyrica. He will follow-up if needed.  Jonathan Alexanders MD 07/17/2016 3:26 PM  [facility] [address] [place], [place] [address]  Phone [phone] Fax [phone]

## 2016-09-01 ENCOUNTER — Other Ambulatory Visit: Payer: Self-pay | Admitting: Sports Medicine

## 2016-09-01 DIAGNOSIS — E349 Endocrine disorder, unspecified: Secondary | ICD-10-CM

## 2016-10-27 ENCOUNTER — Other Ambulatory Visit: Payer: Self-pay | Admitting: Sports Medicine

## 2017-02-11 ENCOUNTER — Other Ambulatory Visit: Payer: Self-pay

## 2017-02-11 DIAGNOSIS — I10 Essential (primary) hypertension: Secondary | ICD-10-CM

## 2017-02-11 DIAGNOSIS — M1000 Idiopathic gout, unspecified site: Secondary | ICD-10-CM

## 2017-02-11 MED ORDER — VALSARTAN-HYDROCHLOROTHIAZIDE 320-25 MG PO TABS: 1.0000 | ORAL_TABLET | Freq: Every day | ORAL | 0 refills | Status: DC

## 2017-02-11 MED ORDER — ALLOPURINOL 300 MG PO TABS: 300.0000 mg | ORAL_TABLET | Freq: Every day | ORAL | 0 refills | Status: DC

## 2017-02-11 MED ORDER — AMLODIPINE BESYLATE 10 MG PO TABS: 10.0000 mg | ORAL_TABLET | Freq: Every day | ORAL | 0 refills | Status: DC

## 2017-03-09 ENCOUNTER — Other Ambulatory Visit: Payer: Self-pay | Admitting: Sports Medicine

## 2017-03-09 DIAGNOSIS — E349 Endocrine disorder, unspecified: Secondary | ICD-10-CM

## 2017-03-10 ENCOUNTER — Telehealth: Payer: Self-pay | Admitting: Sports Medicine

## 2017-03-10 DIAGNOSIS — E349 Endocrine disorder, unspecified: Secondary | ICD-10-CM

## 2017-03-10 MED ORDER — TESTOSTERONE CYPIONATE 200 MG/ML IM SOLN: 200.0000 mg | INTRAMUSCULAR | 0 refills | Status: DC

## 2017-03-10 NOTE — Telephone Encounter (Signed)
Refilled and in box.

## 2017-03-10 NOTE — Telephone Encounter (Signed)
Patient called and scheduled a pcp F/u for this week with Dr.Thekkekandam but he also needs a refill on his testosterone and wants to switch his pharmacy to [facility] on [address] in [place]

## 2017-03-11 ENCOUNTER — Other Ambulatory Visit: Payer: Self-pay

## 2017-03-11 DIAGNOSIS — N5201 Erectile dysfunction due to arterial insufficiency: Secondary | ICD-10-CM

## 2017-03-11 MED ORDER — INDOMETHACIN 50 MG PO CAPS: 50.0000 mg | ORAL_CAPSULE | Freq: Two times a day (BID) | ORAL | 2 refills | Status: DC

## 2017-03-11 MED ORDER — SILDENAFIL CITRATE 20 MG PO TABS: ORAL_TABLET | ORAL | 99 refills | Status: DC

## 2017-03-11 NOTE — Telephone Encounter (Signed)
Medications are all historical. Is it ok to refill.

## 2017-03-14 ENCOUNTER — Ambulatory Visit (INDEPENDENT_AMBULATORY_CARE_PROVIDER_SITE_OTHER): Payer: BLUE CROSS/BLUE SHIELD | Admitting: Sports Medicine

## 2017-03-14 ENCOUNTER — Encounter: Payer: Self-pay | Admitting: Sports Medicine

## 2017-03-14 DIAGNOSIS — Z0000 Encounter for general adult medical examination without abnormal findings: Secondary | ICD-10-CM | POA: Diagnosis not present

## 2017-03-14 DIAGNOSIS — I10 Essential (primary) hypertension: Secondary | ICD-10-CM | POA: Diagnosis not present

## 2017-03-14 DIAGNOSIS — M7712 Lateral epicondylitis, left elbow: Secondary | ICD-10-CM

## 2017-03-14 DIAGNOSIS — M1A0620 Idiopathic chronic gout, left knee, without tophus (tophi): Secondary | ICD-10-CM | POA: Diagnosis not present

## 2017-03-14 DIAGNOSIS — M7711 Lateral epicondylitis, right elbow: Secondary | ICD-10-CM | POA: Diagnosis not present

## 2017-03-14 DIAGNOSIS — E349 Endocrine disorder, unspecified: Secondary | ICD-10-CM | POA: Diagnosis not present

## 2017-03-14 MED ORDER — MELOXICAM 15 MG PO TABS: ORAL_TABLET | ORAL | 3 refills | Status: DC

## 2017-03-14 NOTE — Assessment & Plan Note (Addendum)
Rechecking testosterone levels.  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 The patient is not a smoker, we are going to decrease the frequency of injections to once every 2 weeks from every week. Recheck testosterone 1 week after the second injection.

## 2017-03-14 NOTE — Assessment & Plan Note (Signed)
Checking routine bloodwork. 

## 2017-03-14 NOTE — Assessment & Plan Note (Signed)
Controlled, continue medications, no changes needed.

## 2017-03-14 NOTE — Progress Notes (Addendum)
  Subjective:    CC: Follow-up multiple issues  HPI: Hypertension: Controlled.  Gout: Controlled.  Hypogonadism: Controlled  Elbow pain: Bilateral, present for a few weeks, localized over the lateral epicondyle, worse with gripping, extension of the wrist. Moderate, persistent without radiation.  Past medical history:  Negative.  See flowsheet/record as well for more information.  Surgical history: Negative.  See flowsheet/record as well for more information.  Family history: Negative.  See flowsheet/record as well for more information.  Social history: Negative.  See flowsheet/record as well for more information.  Allergies, and medications have been entered into the medical record, reviewed, and no changes needed.   Review of Systems: No fevers, chills, night sweats, weight loss, chest pain, or shortness of breath.   Objective:    General: Well Developed, well nourished, and in no acute distress.  Neuro: Alert and oriented x3, extra-ocular muscles intact, sensation grossly intact.  HEENT: Normocephalic, atraumatic, pupils equal round reactive to light, neck supple, no masses, no lymphadenopathy, thyroid nonpalpable.  Skin: Warm and dry, no rashes. Cardiac: Regular rate and rhythm, no murmurs rubs or gallops, no lower extremity edema.  Respiratory: Clear to auscultation bilaterally. Not using accessory muscles, speaking in full sentences. Bilateral elbows: Unremarkable to inspection. Range of motion full pronation, supination, flexion, extension. Strength is full to all of the above directions Stable to varus, valgus stress. Negative moving valgus stress test. Tender palpation at the common extensor tendon origin with reproduction of pain with resisted extension of the middle finger. Ulnar nerve does not sublux. Negative cubital tunnel Tinel's.  Impression and Recommendations:    Annual physical exam Checking routine blood work.  Essential hypertension, benign Controlled,  continue medications, no changes needed.  Gout Rechecking uric acid, continue allopurinol  Lateral epicondylitis of both elbows Meloxicam, bilateral tennis elbow braces, tennis elbow rehabilitation exercises given. Return in 2-4 weeks, injections if no better.  Testosterone deficiency Rechecking testosterone levels.  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 The patient is not a smoker, we are going to decrease the frequency of injections to once every 2 weeks from every week. Recheck testosterone 1 week after the second injection.  ___________________________________________ Gwen Her. Dianah Field, M.D., ABFM., CAQSM. Primary Care and [place] Instructor of [place] [employer] [facility] [employer]

## 2017-03-14 NOTE — Assessment & Plan Note (Signed)
Meloxicam, bilateral tennis elbow braces, tennis elbow rehabilitation exercises given. Return in 2-4 weeks, injections if no better.

## 2017-03-14 NOTE — Assessment & Plan Note (Signed)
Rechecking uric acid, continue allopurinol

## 2017-03-17 NOTE — Addendum Note (Signed)
Addended by: Silverio Decamp on: 03/17/2017 09:24 AM   Modules accepted: Orders

## 2017-03-18 LAB — HEMOGLOBIN A1C
Hgb A1c MFr Bld: 5.5 %{Hb} (ref <5.7)
Mean Plasma Glucose: 111 (calc)
eAG (mmol/L): 6.2 (calc)

## 2017-03-18 LAB — LIPID PANEL W/REFLEX DIRECT LDL
Cholesterol: 158 mg/dL (ref <200)
HDL: 40 mg/dL — ABNORMAL LOW (ref >40)
LDL Cholesterol (Calc): 95 mg/dL (calc)
Non-HDL Cholesterol (Calc): 118 mg/dL (ref <130)
Total CHOL/HDL Ratio: 4 (calc) (ref <5.0)
Triglycerides: 125 mg/dL (ref <150)

## 2017-03-18 LAB — COMPREHENSIVE METABOLIC PANEL WITH GFR
AG Ratio: 1.4 (calc) (ref 1.0–2.5)
AST: 48 U/L — ABNORMAL HIGH (ref 10–40)
Alkaline phosphatase (APISO): 60 U/L (ref 40–115)
CO2: 22 mmol/L (ref 20–32)
Calcium: 9.4 mg/dL (ref 8.6–10.3)
Chloride: 105 mmol/L (ref 98–110)
Globulin: 3 g/dL (ref 1.9–3.7)
Total Bilirubin: 0.9 mg/dL (ref 0.2–1.2)

## 2017-03-18 LAB — COMPREHENSIVE METABOLIC PANEL
ALT: 51 U/L — ABNORMAL HIGH (ref 9–46)
Albumin: 4.2 g/dL (ref 3.6–5.1)
BUN/Creatinine Ratio: 17 (calc) (ref 6–22)
BUN: 30 mg/dL — ABNORMAL HIGH (ref 7–25)
Creat: 1.79 mg/dL — ABNORMAL HIGH (ref 0.60–1.35)
Glucose, Bld: 90 mg/dL (ref 65–99)
Potassium: 4.4 mmol/L (ref 3.5–5.3)
Sodium: 137 mmol/L (ref 135–146)
Total Protein: 7.2 g/dL (ref 6.1–8.1)

## 2017-03-18 LAB — PSA, TOTAL AND FREE
PSA, % Free: 60 % (ref >25)
PSA, Free: 0.3 ng/mL
PSA, Total: 0.5 ng/mL (ref <=4.0)

## 2017-03-18 LAB — CBC
HCT: 57.4 % — ABNORMAL HIGH (ref 38.5–50.0)
Hemoglobin: 19.6 g/dL — ABNORMAL HIGH (ref 13.2–17.1)
MCH: 30.4 pg (ref 27.0–33.0)
MCHC: 34.1 g/dL (ref 32.0–36.0)
MCV: 89 fL (ref 80.0–100.0)
MPV: 12.5 fL (ref 7.5–12.5)
Platelets: 217 10*3/uL (ref 140–400)
RBC: 6.45 Million/uL — ABNORMAL HIGH (ref 4.20–5.80)
RDW: 13.4 % (ref 11.0–15.0)
WBC: 9 10*3/uL (ref 3.8–10.8)

## 2017-03-18 LAB — TSH: TSH: 3.07 mIU/L (ref 0.40–4.50)

## 2017-03-18 LAB — TESTOSTERONE, FREE & TOTAL
Free Testosterone: 252.3 pg/mL — ABNORMAL HIGH (ref 35.0–155.0)
Testosterone, Total, LC-MS-MS: 1287 ng/dL — ABNORMAL HIGH (ref 250–1100)

## 2017-03-18 LAB — URIC ACID: Uric Acid, Serum: 5.3 mg/dL (ref 4.0–8.0)

## 2017-03-18 LAB — VITAMIN D 25 HYDROXY (VIT D DEFICIENCY, FRACTURES): Vit D, 25-Hydroxy: 54 ng/mL (ref 30–100)

## 2017-03-18 LAB — HIV ANTIBODY (ROUTINE TESTING W REFLEX): HIV 1&2 Ab, 4th Generation: NONREACTIVE

## 2017-03-20 ENCOUNTER — Telehealth: Payer: Self-pay

## 2017-03-20 NOTE — Telephone Encounter (Signed)
Pre Authorization was sent to Cover My Meds. Key: [id number] - Rx #: [id number]

## 2017-03-23 ENCOUNTER — Other Ambulatory Visit: Payer: Self-pay | Admitting: Sports Medicine

## 2017-03-23 DIAGNOSIS — I10 Essential (primary) hypertension: Secondary | ICD-10-CM

## 2017-03-25 ENCOUNTER — Other Ambulatory Visit: Payer: Self-pay | Admitting: Sports Medicine

## 2017-03-25 DIAGNOSIS — I10 Essential (primary) hypertension: Secondary | ICD-10-CM

## 2017-03-27 ENCOUNTER — Other Ambulatory Visit: Payer: Self-pay | Admitting: Sports Medicine

## 2017-03-27 ENCOUNTER — Other Ambulatory Visit: Payer: Self-pay

## 2017-03-27 DIAGNOSIS — I10 Essential (primary) hypertension: Secondary | ICD-10-CM

## 2017-03-27 MED ORDER — METOPROLOL TARTRATE 100 MG PO TABS: 200.0000 mg | ORAL_TABLET | Freq: Two times a day (BID) | ORAL | 1 refills | Status: DC

## 2017-04-18 ENCOUNTER — Ambulatory Visit (INDEPENDENT_AMBULATORY_CARE_PROVIDER_SITE_OTHER): Payer: BLUE CROSS/BLUE SHIELD | Admitting: Sports Medicine

## 2017-04-18 ENCOUNTER — Encounter: Payer: Self-pay | Admitting: Sports Medicine

## 2017-04-18 ENCOUNTER — Ambulatory Visit: Payer: BLUE CROSS/BLUE SHIELD | Admitting: Sports Medicine

## 2017-04-18 DIAGNOSIS — E349 Endocrine disorder, unspecified: Secondary | ICD-10-CM | POA: Diagnosis not present

## 2017-04-18 DIAGNOSIS — M7711 Lateral epicondylitis, right elbow: Secondary | ICD-10-CM

## 2017-04-18 DIAGNOSIS — D751 Secondary polycythemia: Secondary | ICD-10-CM | POA: Diagnosis not present

## 2017-04-18 DIAGNOSIS — M7712 Lateral epicondylitis, left elbow: Secondary | ICD-10-CM

## 2017-04-18 MED ORDER — TESTOSTERONE CYPIONATE 200 MG/ML IM SOLN: 200.0000 mg | INTRAMUSCULAR | 0 refills | Status: DC

## 2017-04-18 MED ORDER — TESTOSTERONE CYPIONATE 200 MG/ML IM SOLN
200.0000 mg | Freq: Once | INTRAMUSCULAR | Status: AC
  Administered 2017-04-18: 200 mg via INTRAMUSCULAR

## 2017-04-18 NOTE — Progress Notes (Addendum)
  Subjective:    CC: Follow-up  HPI: Little bilateral tennis elbow: Improved considerably with rehab exercises, he still has a bit of discomfort but feels it is not severe enough for an injection, like to continue with exercises for now.  Male hypogonadism: Testosterone level increased to 1200, CBC/hemoglobin increased to the mid 19s, we discontinued testosterone supplementation, he would like to restart, he was doing his injections weekly.  Past medical history:  Negative.  See flowsheet/record as well for more information.  Surgical history: Negative.  See flowsheet/record as well for more information.  Family history: Negative.  See flowsheet/record as well for more information.  Social history: Negative.  See flowsheet/record as well for more information.  Allergies, and medications have been entered into the medical record, reviewed, and no changes needed.   Review of Systems: No fevers, chills, night sweats, weight loss, chest pain, or shortness of breath.   Objective:    General: Well Developed, well nourished, and in no acute distress.  Neuro: Alert and oriented x3, extra-ocular muscles intact, sensation grossly intact.  HEENT: Normocephalic, atraumatic, pupils equal round reactive to light, neck supple, no masses, no lymphadenopathy, thyroid nonpalpable.  Skin: Warm and dry, no rashes. Cardiac: Regular rate and rhythm, no murmurs rubs or gallops, no lower extremity edema.  Respiratory: Clear to auscultation bilaterally. Not using accessory muscles, speaking in full sentences.  Impression and Recommendations:    Lateral epicondylitis of both elbows Good improvements with rehab exercises, not painful enough to consider an injection, continue home exercises.   Testosterone deficiency Testosterone levels were 1200, hemoglobin was over 19. Stopped testosterone supplementation for 5 weeks. He was doing it every week, we are dropping him down to every 2 weeks, he will do shot  today, shot in 2 weeks and then recheck testosterone levels and CBC exactly 1 week afterwards.  05/11/2017.  Hemoglobin is still elevated, 19.2, he needs to return to Dr. Lewanda Rife his hematologist and likely needs more frequent or more voluminous phlebotomies.  Recheck hemoglobin in 3 months, if still above 18.5 we will stop testosterone supplementation.  Secondary erythrocytosis Testosterone levels continue to be elevated, 19.2, we are doing testosterone supplementation, but he tells me he has erythrocytosis and was getting phlebotomies with a hematologist in the past. He has not seen his hematologist in some time, so before stopping testosterone supplementation we are going to get a second opinion from hematology for consideration of repeat regular 1 unit therapeutic phlebotomies.  I spent 25 minutes with this patient, greater than 50% was face-to-face time counseling regarding the above diagnoses ___________________________________________ Gwen Her. Dianah Field, M.D., ABFM., CAQSM. Primary Care and [place] Instructor of [place] [employer] [facility] [employer]

## 2017-04-18 NOTE — Patient Instructions (Signed)
Testosterone shot given today, give yourself a shot in 2 weeks and then recheck testosterone levels and CBC exactly 1 week afterwards on 05/09/2017.

## 2017-04-18 NOTE — Assessment & Plan Note (Addendum)
Testosterone levels were 1200, hemoglobin was over 19. Stopped testosterone supplementation for 5 weeks. He was doing it every week, we are dropping him down to every 2 weeks, he will do shot today, shot in 2 weeks and then recheck testosterone levels and CBC exactly 1 week afterwards.  05/11/2017.  Hemoglobin is still elevated, 19.2, he needs to return to Dr. Lewanda Rife his hematologist and likely needs more frequent or more voluminous phlebotomies.  Recheck hemoglobin in 3 months, if still above 18.5 we will stop testosterone supplementation.

## 2017-04-18 NOTE — Assessment & Plan Note (Signed)
Good improvements with rehab exercises, not painful enough to consider an injection, continue home exercises.

## 2017-04-19 ENCOUNTER — Other Ambulatory Visit: Payer: Self-pay | Admitting: Sports Medicine

## 2017-04-19 DIAGNOSIS — M1000 Idiopathic gout, unspecified site: Secondary | ICD-10-CM

## 2017-04-19 DIAGNOSIS — I10 Essential (primary) hypertension: Secondary | ICD-10-CM

## 2017-05-21 ENCOUNTER — Other Ambulatory Visit: Payer: Self-pay

## 2017-05-21 DIAGNOSIS — I10 Essential (primary) hypertension: Secondary | ICD-10-CM

## 2017-05-21 DIAGNOSIS — M1000 Idiopathic gout, unspecified site: Secondary | ICD-10-CM

## 2017-05-21 MED ORDER — VALSARTAN-HYDROCHLOROTHIAZIDE 320-25 MG PO TABS: 1.0000 | ORAL_TABLET | Freq: Every day | ORAL | 1 refills | Status: DC

## 2017-05-21 MED ORDER — AMLODIPINE BESYLATE 10 MG PO TABS: 10.0000 mg | ORAL_TABLET | Freq: Every day | ORAL | 1 refills | Status: DC

## 2017-05-21 MED ORDER — ALLOPURINOL 300 MG PO TABS: 300.0000 mg | ORAL_TABLET | Freq: Every day | ORAL | 1 refills | Status: DC

## 2017-05-23 ENCOUNTER — Other Ambulatory Visit: Payer: Self-pay | Admitting: Sports Medicine

## 2017-05-23 DIAGNOSIS — I10 Essential (primary) hypertension: Secondary | ICD-10-CM

## 2017-05-24 LAB — CBC
HCT: 56.5 % — ABNORMAL HIGH (ref 38.5–50.0)
Hemoglobin: 19.2 g/dL — ABNORMAL HIGH (ref 13.2–17.1)
MCH: 29.3 pg (ref 27.0–33.0)
MCHC: 34 g/dL (ref 32.0–36.0)
MCV: 86.3 fL (ref 80.0–100.0)
MPV: 13.1 fL — ABNORMAL HIGH (ref 7.5–12.5)
Platelets: 217 10*3/uL (ref 140–400)
RBC: 6.55 Million/uL — ABNORMAL HIGH (ref 4.20–5.80)
RDW: 15 % (ref 11.0–15.0)
WBC: 8.1 Thousand/uL (ref 3.8–10.8)

## 2017-05-26 NOTE — Addendum Note (Signed)
Addended by: Silverio Decamp on: 05/26/2017 08:35 AM   Modules accepted: Orders

## 2017-05-26 NOTE — Assessment & Plan Note (Signed)
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 He has not seen his hematologist in some time, so before stopping testosterone supplementation we are going to get a second opinion from hematology for consideration of repeat regular 1 unit therapeutic phlebotomies.

## 2017-05-26 NOTE — Addendum Note (Signed)
Addended by: Silverio Decamp on: 05/26/2017 11:59 AM   Modules accepted: Orders

## 2017-06-08 ENCOUNTER — Other Ambulatory Visit: Payer: Self-pay | Admitting: Sports Medicine

## 2017-06-08 DIAGNOSIS — N5201 Erectile dysfunction due to arterial insufficiency: Secondary | ICD-10-CM

## 2017-06-18 ENCOUNTER — Other Ambulatory Visit (HOSPITAL_BASED_OUTPATIENT_CLINIC_OR_DEPARTMENT_OTHER): Payer: BLUE CROSS/BLUE SHIELD

## 2017-06-18 ENCOUNTER — Other Ambulatory Visit: Payer: Self-pay

## 2017-06-18 ENCOUNTER — Encounter: Payer: Self-pay | Admitting: Hematology & Oncology

## 2017-06-18 ENCOUNTER — Ambulatory Visit (HOSPITAL_BASED_OUTPATIENT_CLINIC_OR_DEPARTMENT_OTHER): Payer: BLUE CROSS/BLUE SHIELD | Admitting: Hematology & Oncology

## 2017-06-18 VITALS — BP 111/77 | HR 56 | Temp 98.4°F | Resp 18 | Wt 257.0 lb

## 2017-06-18 DIAGNOSIS — D751 Secondary polycythemia: Secondary | ICD-10-CM

## 2017-06-18 DIAGNOSIS — I10 Essential (primary) hypertension: Secondary | ICD-10-CM

## 2017-06-18 DIAGNOSIS — E291 Testicular hypofunction: Secondary | ICD-10-CM

## 2017-06-18 LAB — CBC WITH DIFFERENTIAL (CANCER CENTER ONLY)
BASO#: 0.1 10*3/uL (ref 0.0–0.2)
BASO%: 0.8 % (ref 0.0–2.0)
EOS%: 4.5 % (ref 0.0–7.0)
Eosinophils Absolute: 0.3 10*3/uL (ref 0.0–0.5)
HCT: 53.7 % — ABNORMAL HIGH (ref 38.7–49.9)
HGB: 19.1 g/dL — ABNORMAL HIGH (ref 13.0–17.1)
LYMPH#: 0.9 10*3/uL (ref 0.9–3.3)
LYMPH%: 12.8 % — AB (ref 14.0–48.0)
MCH: 31 pg (ref 28.0–33.4)
MCHC: 35.6 g/dL (ref 32.0–35.9)
MCV: 87 fL (ref 82–98)
MONO#: 0.8 10*3/uL (ref 0.1–0.9)
MONO%: 11.2 % (ref 0.0–13.0)
NEUT#: 5.1 10*3/uL (ref 1.5–6.5)
NEUT%: 70.7 % (ref 40.0–80.0)
PLATELETS: 160 10*3/uL (ref 145–400)
RBC: 6.17 10*6/uL — AB (ref 4.20–5.70)
RDW: 16.1 % — ABNORMAL HIGH (ref 11.1–15.7)
WBC: 7.3 10*3/uL (ref 4.0–10.0)

## 2017-06-18 LAB — CMP (CANCER CENTER ONLY)
ALT(SGPT): 63 U/L — ABNORMAL HIGH (ref 10–47)
AST: 39 U/L — ABNORMAL HIGH (ref 11–38)
Albumin: 4 g/dL (ref 3.3–5.5)
Alkaline Phosphatase: 95 U/L — ABNORMAL HIGH (ref 26–84)
BUN: 24 mg/dL — AB (ref 7–22)
CHLORIDE: 103 meq/L (ref 98–108)
CO2: 26 meq/L (ref 18–33)
CREATININE: 1.5 mg/dL — AB (ref 0.6–1.2)
Calcium: 10 mg/dL (ref 8.0–10.3)
Glucose, Bld: 137 mg/dL — ABNORMAL HIGH (ref 73–118)
Potassium: 4.1 mEq/L (ref 3.3–4.7)
SODIUM: 142 meq/L (ref 128–145)
Total Bilirubin: 1 mg/dl (ref 0.20–1.60)
Total Protein: 7.6 g/dL (ref 6.4–8.1)

## 2017-06-18 LAB — CHCC SATELLITE - SMEAR

## 2017-06-18 NOTE — Progress Notes (Signed)
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 New Patient (Initial Visit)  : My blood is high again.  HPI: Mr. Osei is a really nice 51 year old white male.  His birthday was actually on [birth year] eve.  He had a nice birthday.  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  He has been followed over at [facility].  He initially was seen probably about 5 years ago.  He was on a phlebotomy program.  He says his blood count normalized.  He has not been phlebotomize for about 3 or 4 years.  He had a very thorough workup by his hematologist at [facility].  He was seen initially by Dr. Verdell Carmine.  He had a routine workup for polycythemia.  He is never had a bone marrow biopsy done.  He was JAK2(-).  His erythropoietin level was only 8.5.  He had been on testosterone replacement therapy.  He is still on some testosterone replacement therapy.  He says not taking this for about 2 months.  I am not sure why he was referred to [place] but I am glad that we have seen him.  He is very nice.  He is a big baseball fan.  He has had 2 elbow surgeries.  It was really fun talking to him about baseball.  He is working as a Development worker, international aid.  He has been quite busy.  He has had no headaches.  He has had no visual issues.  He has had no aqua pruritus.  Surprising, has never been on aspirin.  I told him that he needs to be on baby aspirin (81 mg p.o. daily).  He has had no fever.  He has had no change in bowel or bladder habits.  He does not smoke.  He has an occasional beer.  There has been no weight loss or weight gain.  Currently, his performance status is ECOG 0.   Past Medical History:  Diagnosis Date  . Gout   . Headache   . Hypertension   . IFG (impaired fasting glucose)   . Kidney stones   . MRSA (methicillin resistant Staphylococcus aureus)   :  Past Surgical History:  Procedure  Laterality Date  . ELBOW SURGERY Right    x 2  . sinus surgey    . uvuloplasty for OSA    :   Current Outpatient Medications:  .  allopurinol (ZYLOPRIM) 300 MG tablet, Take 1 tablet (300 mg total) by mouth daily., Disp: 90 tablet, Rfl: 1 .  amLODipine (NORVASC) 10 MG tablet, Take 1 tablet (10 mg total) by mouth daily., Disp: 90 tablet, Rfl: 1 .  meloxicam (MOBIC) 15 MG tablet, One tab PO qAM with breakfast for 2 weeks, then daily prn pain. (Patient not taking: Reported on 04/18/2017), Disp: 30 tablet, Rfl: 3 .  metoprolol tartrate (LOPRESSOR) 100 MG tablet, TAKE 2 TABLETS (200 MG TOTAL) BY MOUTH 2 (TWO) TIMES DAILY., Disp: 120 tablet, Rfl: 1 .  sildenafil (REVATIO) 20 MG tablet, TAKE 5 TABLETS BY MOUTH IF NEEDED, Disp: 50 tablet, Rfl: 1 .  testosterone cypionate (DEPOTESTOSTERONE CYPIONATE) 200 MG/ML injection, Inject 1 mL (200 mg total) into the muscle every 14 (fourteen) days., Disp: 10 mL, Rfl: 0 .  valsartan-hydrochlorothiazide (DIOVAN-HCT) 320-25 MG tablet, Take 1 tablet by mouth daily., Disp: 90 tablet, Rfl: 1:  :  No Known Allergies:  Family History  Problem Relation Age of Onset  .  Cancer Mother        Marena Chancy - thinks it started in her lungs.  . Cancer Father        Unsure of type.  . Diabetes Maternal Grandfather   . Diabetes Maternal Aunt   . Hypertension Neg Hx   :  Social History   Socioeconomic History  . Marital status: Married    Spouse name: Not on file  . Number of children: 3  . Years of education: HS  . Highest education level: Not on file  Social Needs  . Financial resource strain: Not on file  . Food insecurity - worry: Not on file  . Food insecurity - inability: Not on file  . Transportation needs - medical: Not on file  . Transportation needs - non-medical: Not on file  Occupational History  . Occupation: Maintenance  Tobacco Use  . Smoking status: Never Smoker  . Smokeless tobacco: Never Used  Substance and Sexual Activity  . Alcohol use:  Yes    Alcohol/week: 25.2 oz    Types: 42 Cans of beer per week    Comment: 6-8 beers a day  . Drug use: No  . Sexual activity: Not on file  Other Topics Concern  . Not on file  Social History Narrative   Lives at home with his spouse.   Right-handed.   1 can of [facility] per day.  :  Pertinent items are noted in HPI.  Exam: Well-developed well-nourished white male in no obvious distress.  Vital signs show a temperature of 98.4.  Pulse 56.  Blood pressure 111/77.  Weight is 257 pounds.  Head exam shows no ocular or oral lesions.  He has facial plethora.  He has some conjunctival inflammation.  There is no oral lesions.  He has no adenopathy in his neck.  Lungs are clear bilaterally.  Cardiac exam regular rate and rhythm with no murmurs, rubs or bruits.  Abdomen is soft.  He has good bowel sounds.  There is no fluid wave.  There is no palpable liver or spleen tip.  Back exam shows no tenderness over the spine, ribs or hips.  Extremities shows no clubbing, cyanosis or edema.  He has good range of motion of his joints.  Neurological exam shows no focal neurological deficit.  Skin exam shows a ruddy complexion throughout his body. '@IPVITALS'$ @   Recent Labs    06/18/17 1425  WBC 7.3  HGB 19.1*  HCT 53.7*  PLT 160   Recent Labs    06/18/17 1425  NA 142  K 4.1  CL 103  CO2 26  GLUCOSE 137*  BUN 24*  CREATININE 1.5*  CALCIUM 10.0    Blood smear review: Normochromic and normocytic population of red blood cells.  There are no nucleated red blood cells.  I see no rouleaux formation.  He has no target cells.  There are no inclusion bodies.  White blood cells appear normal in morphology maturation.  I see no hypersegmented polys.  There are no immature myeloid or lymphoid forms.  Platelets are adequate in number and size.  Platelets are well granulated.  Pathology: None    Assessment and Plan: Mr. Schwanke is a 51 year old white male.  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  I had  believe that this is secondary polycythemia.  Had a fairly thorough workup by Dr. Georgiann Cocker.  He was negative for JAK2 mutation.  Had a very low erythropoietin level.  I cannot find iron levels but I would  suspect that they are low.  He definitely needs to be phlebotomized.  We need to get his hematocrit below 45%.  I also believe that he needs to be on baby aspirin.  I think this is very helpful.  I spent about 45 minutes with him.  He is very nice.  It was fun to talk with him.  We will set him up with a phlebotomy program to have his blood phlebotomized weekly for the next 3 weeks.  It is possible that he may need to have a bone marrow test done.  We will see what his testosterone level is.  He needs the testosterone supplementation to feel better and be productive at work.  Answered all of his questions.  He felt very good about being in our office.

## 2017-06-19 LAB — IRON AND TIBC
%SAT: 63 % — AB (ref 20–55)
IRON: 214 ug/dL — AB (ref 42–163)
TIBC: 340 ug/dL (ref 202–409)
UIBC: 126 ug/dL (ref 117–376)

## 2017-06-19 LAB — HGB FRAC. W/SOLUBILITY
HGB A2: 2.3 % (ref 1.8–3.2)
HGB A: 97.7 % (ref 96.4–98.8)
HGB C: 0 %
HGB F: 0 % (ref 0.0–2.0)
HGB Variant: 0 %
Hgb S: 0 %
Hgb Solubility: NEGATIVE

## 2017-06-19 LAB — ERYTHROPOIETIN: Erythropoietin: 9.1 m[IU]/mL (ref 2.6–18.5)

## 2017-06-19 LAB — TESTOSTERONE: Testosterone, Serum: 235 ng/dL — ABNORMAL LOW (ref 264–916)

## 2017-06-19 LAB — RETICULOCYTES: RETICULOCYTE COUNT: 1.8 % (ref 0.6–2.6)

## 2017-06-19 LAB — LACTATE DEHYDROGENASE: LDH: 214 U/L (ref 125–245)

## 2017-06-19 LAB — FERRITIN: Ferritin: 269 ng/ml (ref 22–316)

## 2017-06-19 LAB — VITAMIN B12: Vitamin B12: 241 pg/mL (ref 232–1245)

## 2017-06-20 ENCOUNTER — Ambulatory Visit (HOSPITAL_BASED_OUTPATIENT_CLINIC_OR_DEPARTMENT_OTHER): Payer: BLUE CROSS/BLUE SHIELD

## 2017-06-20 VITALS — BP 113/64 | HR 61 | Temp 98.4°F | Resp 20

## 2017-06-20 DIAGNOSIS — D751 Secondary polycythemia: Secondary | ICD-10-CM | POA: Diagnosis not present

## 2017-06-20 NOTE — Progress Notes (Signed)
Jonathan Oliver. presents today for phlebotomy per MD orders. Phlebotomy procedure started at 1504 and ended at 1509. 555 cc removed without difficulty to right ac via phlebotomy kit.  Patient tolerated procedure well. IV needle removed intact.  Pt refused drink, snack and to stay for 30 minute post observation.

## 2017-06-20 NOTE — Patient Instructions (Signed)

## 2017-06-27 ENCOUNTER — Encounter: Payer: Self-pay | Admitting: Hematology & Oncology

## 2017-06-27 ENCOUNTER — Ambulatory Visit (HOSPITAL_BASED_OUTPATIENT_CLINIC_OR_DEPARTMENT_OTHER): Payer: BLUE CROSS/BLUE SHIELD

## 2017-06-27 VITALS — BP 119/75 | HR 61 | Temp 97.6°F | Resp 18

## 2017-06-27 DIAGNOSIS — D751 Secondary polycythemia: Secondary | ICD-10-CM

## 2017-06-27 NOTE — Patient Instructions (Signed)

## 2017-06-27 NOTE — Progress Notes (Signed)
Jonathan Oliver. presents today for phlebotomy per MD orders. Phlebotomy procedure started at 1548 and ended at 1558. 514 cc removed via phlebotomy kit on L antecubital site. Patient tolerated procedure well. Refused to stay for the 30 minutes observation, discharged stable and asymptomatic.

## 2017-07-03 ENCOUNTER — Telehealth: Payer: Self-pay | Admitting: *Deleted

## 2017-07-03 NOTE — Telephone Encounter (Signed)
-----   Message from Volanda Napoleon, MD sent at 07/02/2017  1:45 PM EST ----- Call - there are no abnormal genes in your blood cells!!!  This is great!!!  Laurey Arrow

## 2017-07-04 ENCOUNTER — Inpatient Hospital Stay: Payer: BLUE CROSS/BLUE SHIELD | Attending: Hematology & Oncology

## 2017-07-04 VITALS — BP 114/77 | HR 57

## 2017-07-04 DIAGNOSIS — Z79899 Other long term (current) drug therapy: Secondary | ICD-10-CM | POA: Diagnosis not present

## 2017-07-04 DIAGNOSIS — Z7982 Long term (current) use of aspirin: Secondary | ICD-10-CM | POA: Insufficient documentation

## 2017-07-04 DIAGNOSIS — D751 Secondary polycythemia: Secondary | ICD-10-CM | POA: Diagnosis not present

## 2017-07-04 DIAGNOSIS — R5383 Other fatigue: Secondary | ICD-10-CM | POA: Diagnosis not present

## 2017-07-04 DIAGNOSIS — E291 Testicular hypofunction: Secondary | ICD-10-CM | POA: Diagnosis not present

## 2017-07-04 NOTE — Progress Notes (Signed)
Jonathan Oliver. presents today for phlebotomy per MD orders. Phlebotomy procedure started at 1500 and ended at 1535 to left ac via phlebotomy kit.  540 cc removed and pressure dressing applied.  Patient tolerated procedure well.  Pt refused snack and drink after phlebotomy.  Pt refused to stay for 30 minutes post phlebotomy.   IV needle removed intact.  Dressing clean, dry and intact to left ac at time of discharge.

## 2017-07-09 ENCOUNTER — Other Ambulatory Visit: Payer: Self-pay | Admitting: Family

## 2017-07-11 ENCOUNTER — Inpatient Hospital Stay: Payer: BLUE CROSS/BLUE SHIELD

## 2017-07-11 ENCOUNTER — Other Ambulatory Visit: Payer: Self-pay

## 2017-07-11 ENCOUNTER — Inpatient Hospital Stay: Payer: BLUE CROSS/BLUE SHIELD | Admitting: Hematology & Oncology

## 2017-07-11 VITALS — BP 133/79 | HR 58 | Resp 18

## 2017-07-11 DIAGNOSIS — D751 Secondary polycythemia: Secondary | ICD-10-CM

## 2017-07-11 NOTE — Patient Instructions (Signed)

## 2017-07-11 NOTE — Progress Notes (Signed)
It is okay to do a phlebotomy.  We need to get his hematocrit down below 45%.  Jonathan ennever, md

## 2017-07-11 NOTE — Progress Notes (Signed)
Jonathan Oliver. presents today for phlebotomy per MD orders. Phlebotomy procedure started at 1603 and ended at 1609. 523 cc removed via 16 G needle at R antecubital site. Patient tolerated procedure well. Refused to wait 30 minutes post procedure, discharged stable and asymptomatic.

## 2017-07-18 ENCOUNTER — Inpatient Hospital Stay: Payer: BLUE CROSS/BLUE SHIELD

## 2017-07-18 ENCOUNTER — Other Ambulatory Visit: Payer: Self-pay

## 2017-07-18 ENCOUNTER — Inpatient Hospital Stay (HOSPITAL_BASED_OUTPATIENT_CLINIC_OR_DEPARTMENT_OTHER): Payer: BLUE CROSS/BLUE SHIELD | Admitting: Family

## 2017-07-18 VITALS — BP 142/85 | HR 66 | Resp 16

## 2017-07-18 DIAGNOSIS — D500 Iron deficiency anemia secondary to blood loss (chronic): Secondary | ICD-10-CM

## 2017-07-18 DIAGNOSIS — D751 Secondary polycythemia: Secondary | ICD-10-CM

## 2017-07-18 DIAGNOSIS — Z79899 Other long term (current) drug therapy: Secondary | ICD-10-CM | POA: Diagnosis not present

## 2017-07-18 DIAGNOSIS — Z7982 Long term (current) use of aspirin: Secondary | ICD-10-CM | POA: Diagnosis not present

## 2017-07-18 DIAGNOSIS — E291 Testicular hypofunction: Secondary | ICD-10-CM

## 2017-07-18 DIAGNOSIS — R5383 Other fatigue: Secondary | ICD-10-CM | POA: Diagnosis not present

## 2017-07-18 LAB — CBC WITH DIFFERENTIAL (CANCER CENTER ONLY)
Basophils Absolute: 0.1 10*3/uL (ref 0.0–0.1)
Basophils Relative: 1 %
EOS ABS: 0.3 10*3/uL (ref 0.0–0.5)
EOS PCT: 5 %
HCT: 45 % (ref 38.7–49.9)
Hemoglobin: 15.4 g/dL (ref 13.0–17.1)
Lymphocytes Relative: 17 %
Lymphs Abs: 1 10*3/uL (ref 0.9–3.3)
MCH: 33 pg (ref 28.0–33.4)
MCHC: 34.2 g/dL (ref 32.0–35.9)
MCV: 96.6 fL (ref 82.0–98.0)
MONO ABS: 0.8 10*3/uL (ref 0.1–0.9)
MONOS PCT: 14 %
Neutro Abs: 3.6 10*3/uL (ref 1.5–6.5)
Neutrophils Relative %: 63 %
PLATELETS: 187 10*3/uL (ref 140–400)
RBC: 4.66 MIL/uL (ref 4.20–5.70)
RDW: 15.8 % — ABNORMAL HIGH (ref 11.1–15.7)
WBC Count: 5.6 10*3/uL (ref 4.0–10.3)

## 2017-07-18 NOTE — Patient Instructions (Signed)
Therapeutic Phlebotomy Therapeutic phlebotomy is the controlled removal of blood from a person's body for the purpose of treating a medical condition. The procedure is similar to donating blood. Usually, about a pint (470 mL, or 0.47L) of blood is removed. The average adult has 9-12 pints (4.3-5.7 L) of blood. Therapeutic phlebotomy may be used to treat the following medical conditions:  Hemochromatosis. This is a condition in which the blood contains too much iron.  Polycythemia vera. This is a condition in which the blood contains too many red blood cells.  Porphyria cutanea tarda. This is a disease in which an important part of hemoglobin is not made properly. It results in the buildup of abnormal amounts of porphyrins in the body.  Sickle cell disease. This is a condition in which the red blood cells form an abnormal crescent shape rather than a round shape.  Tell a health care provider about:  Any allergies you have.  All medicines you are taking, including vitamins, herbs, eye drops, creams, and over-the-counter medicines.  Any problems you or family members have had with anesthetic medicines.  Any blood disorders you have.  Any surgeries you have had.  Any medical conditions you have. What are the risks? Generally, this is a safe procedure. However, problems may occur, including:  Nausea or light-headedness.  Low blood pressure.  Soreness, bleeding, swelling, or bruising at the needle insertion site.  Infection.  What happens before the procedure?  Follow instructions from your health care provider about eating or drinking restrictions.  Ask your health care provider about changing or stopping your regular medicines. This is especially important if you are taking diabetes medicines or blood thinners.  Wear clothing with sleeves that can be raised above the elbow.  Plan to have someone take you home after the procedure.  You may have a blood sample taken. What  happens during the procedure?  A needle will be inserted into one of your veins.  Tubing and a collection bag will be attached to that needle.  Blood will flow through the needle and tubing into the collection bag.  You may be asked to open and close your hand slowly and continually during the entire collection.  After the specified amount of blood has been removed from your body, the collection bag and tubing will be clamped.  The needle will be removed from your vein.  Pressure will be held on the site of the needle insertion to stop the bleeding.  A bandage (dressing) will be placed over the needle insertion site. The procedure may vary among health care providers and hospitals. What happens after the procedure?  Your recovery will be assessed and monitored.  You can return to your normal activities as directed by your health care provider. This information is not intended to replace advice given to you by your health care provider. Make sure you discuss any questions you have with your health care provider. Document Released: 11/12/2010 Document Revised: 02/10/2016 Document Reviewed: 06/06/2014 Elsevier Interactive Patient Education  2018 Elsevier Inc.  

## 2017-07-18 NOTE — Progress Notes (Signed)
Hematology and Oncology Follow Up Visit  Renzo Vincelette [id number] 1966-04-16 52 y.o. 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 Hopwood is here today for follow-up. He has had a nice response to phlebotomies over the last month and Hct is now 45%. He is having some fatigue and would like to be able to restart testosterone injections.  He denies fever, chills, n/v, cough, SOB, rash, dizziness, headaches, blurred vision, chest pain, palpitations, abdominal pain or changes in bowel or bladder habits.  No swelling, tenderness, numbness or tingling in his extremities. No c/o pain.  No bruising or bleeding. No lymphadenopathy found on exam.  He has maintained a good appetite and is staying well hydrated. He likes to have at least 3 beers each evening after work. He will try to cut back on this. His weight is stable.   ECOG Performance Status: 1 - Symptomatic but completely ambulatory  Medications:  Allergies as of 07/18/2017   No Known Allergies     Medication List        Accurate as of 07/18/17  3:20 PM. Always use your most recent med list.          allopurinol 300 MG tablet Commonly known as:  ZYLOPRIM Take 1 tablet (300 mg total) by mouth daily.   amLODipine 10 MG tablet Commonly known as:  NORVASC Take 1 tablet (10 mg total) by mouth daily.   meloxicam 15 MG tablet Commonly known as:  MOBIC One tab PO qAM with breakfast for 2 weeks, then daily prn pain.   metoprolol tartrate 100 MG tablet Commonly known as:  LOPRESSOR TAKE 2 TABLETS (200 MG TOTAL) BY MOUTH 2 (TWO) TIMES DAILY.   sildenafil 20 MG tablet Commonly known as:  REVATIO TAKE 5 TABLETS BY MOUTH IF NEEDED   testosterone cypionate 200 MG/ML injection Commonly known as:  DEPOTESTOSTERONE CYPIONATE Inject 1 mL (200 mg total) into the muscle every 14 (fourteen)  days.   valsartan-hydrochlorothiazide 320-25 MG tablet Commonly known as:  DIOVAN-HCT Take 1 tablet by mouth daily.       Allergies: No Known Allergies  Past Medical History, Surgical history, Social history, and Family History were reviewed and updated.  Review of Systems: All other 10 point review of systems is negative.   Physical Exam:  vitals were not taken for this visit.   Wt Readings from Last 3 Encounters:  06/18/17 257 lb (116.6 kg)  04/18/17 258 lb (117 kg)  03/14/17 258 lb (117 kg)    Ocular: Sclerae unicteric, pupils equal, round and reactive to light Ear-nose-throat: Oropharynx clear, dentition fair Lymphatic: No cervical, supraclavicular or axillary adenopathy Lungs no rales or rhonchi, good excursion bilaterally Heart regular rate and rhythm, no murmur appreciated Abd soft, nontender, positive bowel sounds, no liver or spleen tip palpated on exam, no fluid wave  MSK no focal spinal tenderness, no joint edema Neuro: non-focal, well-oriented, appropriate affect Breasts: Deferred   Lab Results  Component Value Date   WBC 5.6 07/18/2017   HGB 19.1 (H) 06/18/2017   HCT 45.0 07/18/2017   MCV 96.6 07/18/2017   PLT 187 07/18/2017   Lab Results  Component Value Date   FERRITIN 269 06/18/2017   IRON 214 (H) 06/18/2017   TIBC 340 06/18/2017   UIBC 126 06/18/2017   IRONPCTSAT 63 (H) 06/18/2017   Lab  Results  Component Value Date   RBC 4.66 07/18/2017   No results found for: KPAFRELGTCHN, LAMBDASER, KAPLAMBRATIO No results found for: IGGSERUM, IGA, IGMSERUM No results found for: Ronnald Ramp, A1GS, A2GS, Violet Baldy, MSPIKE, SPEI   Chemistry      Component Value Date/Time   NA 142 06/18/2017 1425   K 4.1 06/18/2017 1425   CL 103 06/18/2017 1425   CO2 26 06/18/2017 1425   BUN 24 (H) 06/18/2017 1425   CREATININE 1.5 (H) 06/18/2017 1425      Component Value Date/Time   CALCIUM 10.0 06/18/2017 1425   ALKPHOS 95 (H) 06/18/2017  1425   AST 39 (H) 06/18/2017 1425   ALT 63 (H) 06/18/2017 1425   BILITOT 1.00 06/18/2017 1425      Impression and Plan: Mr. 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 His Hct has come down to 45% after 4 phlebotomies in the last month.  We will proceed with phlebotomy today.  He will restart his testosterone injections with his PCP and will also start donating with the [facility].  We will plan to see him back again in another month for follow-up, lab and phlebotomy if needed.  He will contact our office with any other questions or concerns. We can certainly see him sooner if need be.   Laverna Peace, NP 1/25/20193:20 PM

## 2017-07-18 NOTE — Progress Notes (Unsigned)
Jonathan Oliver. presents today for phlebotomy per MD orders. Phlebotomy procedure started at 1548 and ended at 1555. 500 grams removed. Patient observed for 30 minutes after procedure without any incident. Patient tolerated procedure well. IV needle removed intact.

## 2017-07-21 LAB — IRON AND TIBC
Iron: 133 ug/dL (ref 42–163)
SATURATION RATIOS: 37 % — AB (ref 42–163)
TIBC: 358 ug/dL (ref 202–409)
UIBC: 224 ug/dL

## 2017-07-21 LAB — FERRITIN: FERRITIN: 101 ng/mL (ref 22–316)

## 2017-07-25 ENCOUNTER — Other Ambulatory Visit: Payer: Self-pay | Admitting: Sports Medicine

## 2017-07-25 DIAGNOSIS — I10 Essential (primary) hypertension: Secondary | ICD-10-CM

## 2017-07-27 ENCOUNTER — Other Ambulatory Visit: Payer: Self-pay | Admitting: Sports Medicine

## 2017-07-27 DIAGNOSIS — E349 Endocrine disorder, unspecified: Secondary | ICD-10-CM

## 2017-08-03 ENCOUNTER — Other Ambulatory Visit: Payer: Self-pay | Admitting: Sports Medicine

## 2017-08-03 DIAGNOSIS — E349 Endocrine disorder, unspecified: Secondary | ICD-10-CM

## 2017-08-22 ENCOUNTER — Inpatient Hospital Stay: Payer: BLUE CROSS/BLUE SHIELD

## 2017-08-22 ENCOUNTER — Inpatient Hospital Stay: Payer: BLUE CROSS/BLUE SHIELD | Attending: Hematology & Oncology | Admitting: Hematology & Oncology

## 2017-08-22 VITALS — BP 124/88 | HR 63 | Temp 98.2°F | Resp 19 | Wt 268.8 lb

## 2017-08-22 DIAGNOSIS — Z7982 Long term (current) use of aspirin: Secondary | ICD-10-CM | POA: Insufficient documentation

## 2017-08-22 DIAGNOSIS — D751 Secondary polycythemia: Secondary | ICD-10-CM

## 2017-08-22 DIAGNOSIS — N289 Disorder of kidney and ureter, unspecified: Secondary | ICD-10-CM | POA: Diagnosis not present

## 2017-08-22 DIAGNOSIS — E291 Testicular hypofunction: Secondary | ICD-10-CM | POA: Diagnosis not present

## 2017-08-22 DIAGNOSIS — D500 Iron deficiency anemia secondary to blood loss (chronic): Secondary | ICD-10-CM

## 2017-08-22 DIAGNOSIS — Z79899 Other long term (current) drug therapy: Secondary | ICD-10-CM | POA: Insufficient documentation

## 2017-08-22 DIAGNOSIS — Z87442 Personal history of urinary calculi: Secondary | ICD-10-CM | POA: Insufficient documentation

## 2017-08-22 LAB — CBC WITH DIFFERENTIAL (CANCER CENTER ONLY)
BASOS ABS: 0.1 10*3/uL (ref 0.0–0.1)
Basophils Relative: 1 %
EOS PCT: 6 %
Eosinophils Absolute: 0.5 10*3/uL (ref 0.0–0.5)
HEMATOCRIT: 50 % — AB (ref 38.7–49.9)
Hemoglobin: 16.8 g/dL (ref 13.0–17.1)
Lymphocytes Relative: 16 %
Lymphs Abs: 1.3 10*3/uL (ref 0.9–3.3)
MCH: 31.3 pg (ref 28.0–33.4)
MCHC: 33.6 g/dL (ref 32.0–35.9)
MCV: 93.3 fL (ref 82.0–98.0)
MONO ABS: 1.1 10*3/uL — AB (ref 0.1–0.9)
Monocytes Relative: 13 %
NEUTROS ABS: 5 10*3/uL (ref 1.5–6.5)
Neutrophils Relative %: 64 %
PLATELETS: 211 10*3/uL (ref 145–400)
RBC: 5.36 MIL/uL (ref 4.20–5.70)
RDW: 13.8 % (ref 11.1–15.7)
WBC: 7.9 10*3/uL (ref 4.0–10.0)

## 2017-08-22 LAB — CMP (CANCER CENTER ONLY)
ALT: 57 U/L — ABNORMAL HIGH (ref 10–47)
AST: 46 U/L — AB (ref 11–38)
Albumin: 3.7 g/dL (ref 3.5–5.0)
Alkaline Phosphatase: 88 U/L — ABNORMAL HIGH (ref 26–84)
Anion gap: 5 (ref 5–15)
BUN: 29 mg/dL — ABNORMAL HIGH (ref 7–22)
CALCIUM: 9.3 mg/dL (ref 8.0–10.3)
CO2: 30 mmol/L (ref 18–33)
CREATININE: 2.5 mg/dL — AB (ref 0.60–1.20)
Chloride: 108 mmol/L (ref 98–108)
GLUCOSE: 199 mg/dL — AB (ref 73–118)
Potassium: 3.8 mmol/L (ref 3.3–4.7)
Sodium: 143 mmol/L (ref 128–145)
Total Bilirubin: 0.7 mg/dL (ref 0.2–1.6)
Total Protein: 7.2 g/dL (ref 6.4–8.1)

## 2017-08-22 NOTE — Patient Instructions (Signed)

## 2017-08-22 NOTE — Progress Notes (Signed)
Jonathan Oliver. presents today for phlebotomy per MD orders. Phlebotomy procedure started at 1525 and ended at 1530. 530 cc removed via 16 G needle at R antecubitasl site. Patient tolerated procedure well.

## 2017-08-22 NOTE — Progress Notes (Signed)
Hematology and Oncology Follow Up Visit  Jonathan Oliver [id number] 02-06-1966 52 y.o. 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  He is doing okay.  He really has had no complaints since we last saw him.  He is not gone to the [facility] for blood donation since we last saw him.  He is working full-time.  He has to work this weekend.  His iron studies we last saw him showed a ferritin of 101 with iron saturation of 37%.  He is watching what he eats.  He still has some beer.  He does have some renal insufficiency now.  I am not sure why his creatinine is 2.5.  I will have to get an ultrasound of his kidneys.  He has had kidney stones in the past.  He has had no change in medications.  He has had no fever.  He has had no weight loss or weight gain.  He has had no leg swelling.  Overall, his performance status is ECOG 0.  Medications:  Allergies as of 08/22/2017   No Known Allergies     Medication List        Accurate as of 08/22/17  3:41 PM. Always use your most recent med list.          allopurinol 300 MG tablet Commonly known as:  ZYLOPRIM Take 1 tablet (300 mg total) by mouth daily.   amLODipine 10 MG tablet Commonly known as:  NORVASC Take 1 tablet (10 mg total) by mouth daily.   meloxicam 15 MG tablet Commonly known as:  MOBIC One tab PO qAM with breakfast for 2 weeks, then daily prn pain.   metoprolol tartrate 100 MG tablet Commonly known as:  LOPRESSOR TAKE 2 TABLETS (200 MG TOTAL) BY MOUTH 2 (TWO) TIMES DAILY.   sildenafil 20 MG tablet Commonly known as:  REVATIO TAKE 5 TABLETS BY MOUTH IF NEEDED   testosterone cypionate 200 MG/ML injection Commonly known as:  DEPOTESTOSTERONE CYPIONATE INJECT 1 ML EVERY 14 DAYS   valsartan-hydrochlorothiazide 320-25 MG  tablet Commonly known as:  DIOVAN-HCT Take 1 tablet by mouth daily.       Allergies: No Known Allergies  Past Medical History, Surgical history, Social history, and Family History were reviewed and updated.  Review of Systems: Review of Systems  Constitutional: Negative.   HENT: Negative.   Eyes: Negative.   Respiratory: Negative.   Cardiovascular: Negative.   Gastrointestinal: Negative.   Genitourinary: Negative.   Skin: Negative.   Neurological: Negative.   Endo/Heme/Allergies: Negative.   Psychiatric/Behavioral: Negative.      Physical Exam:  weight is 268 lb 12 oz (121.9 kg). His oral temperature is 98.2 F (36.8 C). His blood pressure is 117/64 and his pulse is 62. His respiration is 19 and oxygen saturation is 97%.   Wt Readings from Last 3 Encounters:  08/22/17 268 lb 12 oz (121.9 kg)  06/18/17 257 lb (116.6 kg)  04/18/17 258 lb (117 kg)    Physical Exam  Constitutional: He is oriented to person, place, and time.  HENT:  Head: Normocephalic and atraumatic.  Mouth/Throat: Oropharynx is clear and moist.  Eyes: EOM are normal. Pupils are equal, round, and reactive to light.  Neck: Normal range of motion.  Cardiovascular: Normal rate, regular rhythm and normal  heart sounds.  Pulmonary/Chest: Effort normal and breath sounds normal.  Abdominal: Soft. Bowel sounds are normal.  Musculoskeletal: Normal range of motion. He exhibits no edema, tenderness or deformity.  Lymphadenopathy:    He has no cervical adenopathy.  Neurological: He is alert and oriented to person, place, and time.  Skin: Skin is warm and dry. No rash noted. No erythema.  Psychiatric: He has a normal mood and affect. His behavior is normal. Judgment and thought content normal.  Vitals reviewed.   Lab Results  Component Value Date   WBC 7.9 08/22/2017   HGB 19.1 (H) 06/18/2017   HCT 50.0 (H) 08/22/2017   MCV 93.3 08/22/2017   PLT 211 08/22/2017   Lab Results  Component Value Date    FERRITIN 101 07/18/2017   IRON 133 07/18/2017   TIBC 358 07/18/2017   UIBC 224 07/18/2017   IRONPCTSAT 37 (L) 07/18/2017   Lab Results  Component Value Date   RBC 5.36 08/22/2017   No results found for: KPAFRELGTCHN, LAMBDASER, KAPLAMBRATIO No results found for: IGGSERUM, IGA, IGMSERUM No results found for: Odetta Pink, SPEI   Chemistry      Component Value Date/Time   NA 143 08/22/2017 1455   NA 142 06/18/2017 1425   K 3.8 08/22/2017 1455   K 4.1 06/18/2017 1425   CL 108 08/22/2017 1455   CL 103 06/18/2017 1425   CO2 30 08/22/2017 1455   CO2 26 06/18/2017 1425   BUN 29 (H) 08/22/2017 1455   BUN 24 (H) 06/18/2017 1425   CREATININE 2.50 (H) 08/22/2017 1455   CREATININE 1.5 (H) 06/18/2017 1425      Component Value Date/Time   CALCIUM 9.3 08/22/2017 1455   CALCIUM 10.0 06/18/2017 1425   ALKPHOS 88 (H) 08/22/2017 1455   ALKPHOS 95 (H) 06/18/2017 1425   AST 46 (H) 08/22/2017 1455   ALT 57 (H) 08/22/2017 1455   ALT 63 (H) 06/18/2017 1425   BILITOT 0.7 08/22/2017 1455      Impression and Plan: Jonathan Oliver is a very pleasant 52 yo caucasian gentleman with 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   He will be phlebotomized today.  I encouraged him to go to the [facility] for a blood donation.  Again I am not sure why he has the renal insufficiency.  We really need to try to sort this out.  We will get the renal ultrasound next week.  We may have to refer him back to his family doctor for further studies.  I would like to see him back in 1 month.  I want to make sure that we stay in close contact with him particularly with this new problem of his renal insufficiency.  He is having no problems urinating.  He has had no blood in the urine.  He has had no dysuria.  I spent about 25 minutes with him.  Over 50% of the time was spent face-to-face as we are trying to sort out his kidney abnormality.     Volanda Napoleon, MD 3/1/20193:41 PM

## 2017-08-25 ENCOUNTER — Ambulatory Visit (INDEPENDENT_AMBULATORY_CARE_PROVIDER_SITE_OTHER): Payer: BLUE CROSS/BLUE SHIELD

## 2017-08-25 ENCOUNTER — Encounter: Payer: Self-pay | Admitting: Family Medicine

## 2017-08-25 ENCOUNTER — Ambulatory Visit (INDEPENDENT_AMBULATORY_CARE_PROVIDER_SITE_OTHER): Payer: BLUE CROSS/BLUE SHIELD | Admitting: Family Medicine

## 2017-08-25 VITALS — BP 141/89 | HR 71 | Ht 77.0 in | Wt 267.0 lb

## 2017-08-25 DIAGNOSIS — M25562 Pain in left knee: Secondary | ICD-10-CM

## 2017-08-25 DIAGNOSIS — M170 Bilateral primary osteoarthritis of knee: Secondary | ICD-10-CM

## 2017-08-25 LAB — IRON AND TIBC
Iron: 31 ug/dL — ABNORMAL LOW (ref 42–163)
Saturation Ratios: 7 % — ABNORMAL LOW (ref 42–163)
TIBC: 439 ug/dL — AB (ref 202–409)
UIBC: 407 ug/dL

## 2017-08-25 LAB — FERRITIN: FERRITIN: 13 ng/mL — AB (ref 22–316)

## 2017-08-25 MED ORDER — DICLOFENAC SODIUM 1 % TD GEL: 4.0000 g | Freq: Four times a day (QID) | TRANSDERMAL | 11 refills | Status: DC

## 2017-08-25 NOTE — Progress Notes (Signed)
Jonathan Oliver. is a 52 y.o. male who presents to [facility] today for left knee pain. Jonathan Oliver notes a 3 week history of left anterior knee pain.  The pain is associated with grinding and is worse when standing from a seated position, climbing stairs, and seated in a flexed position.  He denies any injury.  He is tried ibuprofen Tylenol IcyHot and Aspercreme of only helped a little.  He denies any locking or catching or giving way.  He has had knee pain prior associate with an effusion that was treated with aspiration and injection. In  2015 left knee x-ray showed mild DJD.  He works as a Therapist, occupational for a [employer]. He notes the pain is interfering with his ability to work at times.   Past Medical History:  Diagnosis Date  . Gout   . Headache   . Hypertension   . IFG (impaired fasting glucose)   . Kidney stones   . MRSA (methicillin resistant Staphylococcus aureus)    Past Surgical History:  Procedure Laterality Date  . ELBOW SURGERY Right    x 2  . sinus surgey    . uvuloplasty for OSA     Social History   Tobacco Use  . Smoking status: Never Smoker  . Smokeless tobacco: Never Used  Substance Use Topics  . Alcohol use: Yes    Alcohol/week: 25.2 oz    Types: 42 Cans of beer per week    Comment: 6-8 beers a day     ROS:  As above   Medications: Current Outpatient Medications  Medication Sig Dispense Refill  . allopurinol (ZYLOPRIM) 300 MG tablet Take 1 tablet (300 mg total) by mouth daily. 90 tablet 1  . amLODipine (NORVASC) 10 MG tablet Take 1 tablet (10 mg total) by mouth daily. 90 tablet 1  . meloxicam (MOBIC) 15 MG tablet One tab PO qAM with breakfast for 2 weeks, then daily prn pain. 30 tablet 3  . metoprolol tartrate (LOPRESSOR) 100 MG tablet TAKE 2 TABLETS (200 MG TOTAL) BY MOUTH 2 (TWO) TIMES DAILY. 120 tablet 1  . sildenafil (REVATIO) 20 MG tablet TAKE 5 TABLETS BY MOUTH IF NEEDED 50 tablet 1  .  testosterone cypionate (DEPOTESTOSTERONE CYPIONATE) 200 MG/ML injection INJECT 1 ML EVERY 14 DAYS 10 mL 0  . valsartan-hydrochlorothiazide (DIOVAN-HCT) 320-25 MG tablet Take 1 tablet by mouth daily. 90 tablet 1  . diclofenac sodium (VOLTAREN) 1 % GEL Apply 4 g topically 4 (four) times daily. To affected joint. 100 g 11   No current facility-administered medications for this visit.    No Known Allergies   Exam:  BP (!) 141/89   Pulse 71   Ht 6\' 5"  (1.956 m)   Wt 267 lb (121.1 kg)   BMI 31.66 kg/m  General: Well Developed, well nourished, and in no acute distress.  Neuro/Psych: Alert and oriented x3, extra-ocular muscles intact, able to move all 4 extremities, sensation grossly intact. Skin: Warm and dry, no rashes noted.  Respiratory: Not using accessory muscles, speaking in full sentences, trachea midline.  Cardiovascular: Pulses palpable, no extremity edema. Abdomen: Does not appear distended. MSK: Left knee.  No effusion. Normal appearing no erythema Range of motion 0-120 degrees with 2+ retropatellar crepitations Mildly tender to palpation at the medial joint line and along the medial aspect of the patellofemoral joint. Stable ligamentous exam. Negative McMurray's test Intact extension and flexion strength Normal gait  Procedure: Real-time Ultrasound Guided  Injection of left knee  Device: GE Logiq E   Images permanently stored and available for review in the ultrasound unit. Verbal informed consent obtained.  Discussed risks and benefits of procedure. Warned about infection bleeding damage to structures skin hypopigmentation and fat atrophy among others. Patient expresses understanding and agreement Time-out conducted.   Noted no overlying erythema, induration, or other signs of local infection.   Skin prepped in a sterile fashion.   Local anesthesia: Topical Ethyl chloride.   With sterile technique and under real time ultrasound guidance:  80mg  kenalog and and 7ml marcaine  injected easily.   Completed without difficulty   Pain immediately resolved suggesting accurate placement of the medication.   Advised to call if fevers/chills, erythema, induration, drainage, or persistent bleeding.   Images permanently stored and available for review in the ultrasound unit.  Impression: Technically successful ultrasound guided injection.      No results found for this or any previous visit (from the past 48 hour(s)). Dg Knee 1-2 Views Right  Result Date: 08/25/2017 CLINICAL DATA:  Acute left knee pain for the past 3 weeks. EXAM: LEFT KNEE - COMPLETE 4+ VIEW; RIGHT KNEE - 1-2 VIEW COMPARISON:  Bilateral knee x-rays dated February 18, 2014. FINDINGS: No acute fracture or dislocation. Unchanged mild medial compartment joint space narrowing bilaterally. No joint effusion. Unchanged dorsal defect of the left patella. Bone mineralization is normal. Vascular calcifications. IMPRESSION: 1. No acute osseous abnormality. 2. Unchanged mild medial compartment osteoarthritis bilaterally. Electronically Signed   By: Titus Dubin M.D.   On: 08/25/2017 16:40   Dg Knee Complete 4 Views Left  Result Date: 08/25/2017 CLINICAL DATA:  Acute left knee pain for the past 3 weeks. EXAM: LEFT KNEE - COMPLETE 4+ VIEW; RIGHT KNEE - 1-2 VIEW COMPARISON:  Bilateral knee x-rays dated February 18, 2014. FINDINGS: No acute fracture or dislocation. Unchanged mild medial compartment joint space narrowing bilaterally. No joint effusion. Unchanged dorsal defect of the left patella. Bone mineralization is normal. Vascular calcifications. IMPRESSION: 1. No acute osseous abnormality. 2. Unchanged mild medial compartment osteoarthritis bilaterally. Electronically Signed   By: Titus Dubin M.D.   On: 08/25/2017 16:40  I personally (independently) visualized and performed the interpretation of the images attached in this note.    Assessment and Plan: 52 y.o. male with left knee pain with significant retropatellar  crepitations in the absence of effusion injury or severe DJD seen on x-ray.  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  Alternative diagnoses could include meniscus injury OCD lesion.  After discussion of treatment options plan for diclofenac gel and steroid injection.  Continue home exercise program working on quadriceps and VMO strength.  Patient has already had a significant 3-week trial of conservative management.  If in 3 weeks from now his pain persists and is interfering with his quality life I think next step would be MRI.  Recheck with PCP as needed.    Orders Placed This Encounter  Procedures  . DG Knee Complete 4 Views Left    Please include patellar sunrise, lateral, and weightbearing bilateral AP and bilateral rosenberg views    Standing Status:   Future    Number of Occurrences:   1    Standing Expiration Date:   10/25/2018    Order Specific Question:   Reason for exam:    Answer:   Please include patellar sunrise, lateral, and weightbearing bilateral AP and bilateral rosenberg views    Comments:  Please include patellar sunrise, lateral, and weightbearing bilateral AP and bilateral rosenberg views    Order Specific Question:   Preferred imaging location?    Answer:   [facility]  . DG Knee 1-2 Views Right    Standing Status:   Future    Number of Occurrences:   1    Standing Expiration Date:   10/26/2018    Order Specific Question:   Reason for Exam (SYMPTOM  OR DIAGNOSIS REQUIRED)    Answer:   For use with the left knee x-ray bilateral AP and Rosenberg standing.    Order Specific Question:   Preferred imaging location?    Answer:   [facility]   Meds ordered this encounter  Medications  . diclofenac sodium (VOLTAREN) 1 % GEL    Sig: Apply 4 g topically 4 (four) times daily. To affected joint.    Dispense:  100 g    Refill:  11    Discussed warning signs or symptoms. Please see discharge instructions. Patient  expresses understanding.

## 2017-08-25 NOTE — Patient Instructions (Signed)
Thank you for coming in today. Apply the diclofenac gel up to 4x daily for pain as needed.  Do the straight leg raises and toe out leg raises.  Recheck if not better.  Next step is MRI.  Call or go to the ER if you develop a large red swollen joint with extreme pain or oozing puss.    Patellofemoral Pain Syndrome Patellofemoral pain syndrome is a condition that involves a softening or breakdown of the tissue (cartilage) on the underside of your kneecap (patella). This causes pain in the front of the knee. The condition is also called runner's knee or chondromalacia patella. Patellofemoral pain syndrome is most common in young adults who are active in sports. Your knee is the largest joint in your body. The patella covers the front of your knee and is attached to muscles above and below your knee. The underside of the patella is covered with a smooth type of cartilage (synovium). The smooth surface helps the patella glide easily when you move your knee. Patellofemoral pain syndrome causes swelling in the joint linings and bone surfaces in your knee. What are the causes? Patellofemoral pain syndrome can be caused by:  Overuse.  Poor alignment of your knee joints.  Weak leg muscles.  A direct blow to your kneecap.  What increases the risk? You may be at risk for patellofemoral pain syndrome if you:  Do a lot of activities that can wear down your kneecap. These include: ? Running. ? Squatting. ? Climbing stairs.  Start a new physical activity or exercise program.  Wear shoes that do not fit well.  Do not have good leg strength.  Are overweight.  What are the signs or symptoms? Knee pain is the most common symptom of patellofemoral pain syndrome. This may feel like a dull, aching pain underneath your patella, in the front of your knee. There may be a popping or cracking sound when you move your knee. Pain may get worse with:  Exercise.  Climbing  stairs.  Running.  Jumping.  Squatting.  Kneeling.  Sitting for a long time.  Moving or pushing on your patella.  How is this diagnosed? Your health care provider may be able to diagnose patellofemoral pain syndrome from your symptoms and medical history. You may be asked about your recent physical activities and which ones cause knee pain. Your health care provider may do a physical exam with certain tests to confirm the diagnosis. These may include:  Moving your patella back and forth.  Checking your range of knee motion.  Having you squat or jump to see if you have pain.  Checking the strength of your leg muscles.  An MRI of the knee may also be done. How is this treated? Patellofemoral pain syndrome can usually be treated at home with rest, ice, compression, and elevation (RICE). Other treatments may include:  Nonsteroidal anti-inflammatory drugs (NSAIDs).  Physical therapy to stretch and strengthen your leg muscles.  Shoe inserts (orthotics) to take stress off your knee.  A knee brace or knee support.  Surgery to remove damaged cartilage or move the patella to a better position. The need for surgery is rare.  Follow these instructions at home:  Take medicines only as directed by your health care provider.  Rest your knee. ? When resting, keep your knee raised above the level of your heart. ? Avoid activities that cause knee pain.  Apply ice to the injured area: ? Put ice in a plastic bag. ? Place  a towel between your skin and the bag. ? Leave the ice on for 20 minutes, 2-3 times a day.  Use splints, braces, knee supports, or walking aids as directed by your health care provider.  Perform stretching and strengthening exercises as directed by your health care provider or physical therapist.  Keep all follow-up visits as directed by your health care provider. This is important. Contact a health care provider if:  Your symptoms get worse.  You are not  improving with home care. This information is not intended to replace advice given to you by your health care provider. Make sure you discuss any questions you have with your health care provider. Document Released: 05/29/2009 Document Revised: 11/16/2015 Document Reviewed: 08/30/2013 Elsevier Interactive Patient Education  2018 [employer].

## 2017-08-25 NOTE — Progress Notes (Signed)
KNEE PAIN

## 2017-08-29 ENCOUNTER — Telehealth: Payer: Self-pay | Admitting: Family Medicine

## 2017-08-29 MED ORDER — DICLOFENAC SODIUM 1 % TD GEL: 4.0000 g | Freq: Four times a day (QID) | TRANSDERMAL | 11 refills | Status: DC

## 2017-08-29 NOTE — Telephone Encounter (Signed)
Diclofenac gel is not available at the [facility] in [place]. I sent it to the [facility] as they may have it.  Your alternative is to use over the counter Aspercreme.  You will need to generate your own GoodRx coupon by going to the webstie or using the app you can download on your phone.

## 2017-08-31 ENCOUNTER — Other Ambulatory Visit: Payer: Self-pay | Admitting: Sports Medicine

## 2017-08-31 DIAGNOSIS — N5201 Erectile dysfunction due to arterial insufficiency: Secondary | ICD-10-CM

## 2017-09-01 NOTE — Telephone Encounter (Signed)
Left detailed message on patient vm advising of this information. Rhonda Cunningham,CMA

## 2017-09-02 ENCOUNTER — Encounter: Payer: Self-pay | Admitting: Sports Medicine

## 2017-09-02 ENCOUNTER — Ambulatory Visit (INDEPENDENT_AMBULATORY_CARE_PROVIDER_SITE_OTHER): Payer: BLUE CROSS/BLUE SHIELD

## 2017-09-02 ENCOUNTER — Ambulatory Visit (INDEPENDENT_AMBULATORY_CARE_PROVIDER_SITE_OTHER): Payer: BLUE CROSS/BLUE SHIELD | Admitting: Sports Medicine

## 2017-09-02 DIAGNOSIS — R69 Illness, unspecified: Principal | ICD-10-CM

## 2017-09-02 DIAGNOSIS — J111 Influenza due to unidentified influenza virus with other respiratory manifestations: Secondary | ICD-10-CM | POA: Insufficient documentation

## 2017-09-02 DIAGNOSIS — J439 Emphysema, unspecified: Secondary | ICD-10-CM

## 2017-09-02 DIAGNOSIS — Z0000 Encounter for general adult medical examination without abnormal findings: Secondary | ICD-10-CM | POA: Diagnosis not present

## 2017-09-02 MED ORDER — THERAFLU SEVERE COLD/CGH DAY 20-10-650 MG PO PACK: PACK | ORAL | 0 refills | Status: DC

## 2017-09-02 MED ORDER — OSELTAMIVIR PHOSPHATE 75 MG PO CAPS: 75.0000 mg | ORAL_CAPSULE | Freq: Two times a day (BID) | ORAL | 0 refills | Status: DC

## 2017-09-02 NOTE — Progress Notes (Signed)
Subjective:    CC: Feeling sick  HPI: For the past 2 days this pleasant 52 year old male has had fevers, chills, muscle aches, body aches, cough, sore throat, headache.  I reviewed the past medical history, family history, social history, surgical history, and allergies today and no changes were needed.  Please see the problem list section below in epic for further details.  Past Medical History: Past Medical History:  Diagnosis Date  . Gout   . Headache   . Hypertension   . IFG (impaired fasting glucose)   . Kidney stones   . MRSA (methicillin resistant Staphylococcus aureus)    Past Surgical History: Past Surgical History:  Procedure Laterality Date  . ELBOW SURGERY Right    x 2  . sinus surgey    . uvuloplasty for OSA     Social History: Social History   Socioeconomic History  . Marital status: Married    Spouse name: None  . Number of children: 3  . Years of education: HS  . Highest education level: None  Social Needs  . Financial resource strain: None  . Food insecurity - worry: None  . Food insecurity - inability: None  . Transportation needs - medical: None  . Transportation needs - non-medical: None  Occupational History  . Occupation: Maintenance  Tobacco Use  . Smoking status: Never Smoker  . Smokeless tobacco: Never Used  Substance and Sexual Activity  . Alcohol use: Yes    Alcohol/week: 25.2 oz    Types: 42 Cans of beer per week    Comment: 6-8 beers a day  . Drug use: No  . Sexual activity: None  Other Topics Concern  . None  Social History Narrative   Lives at home with his spouse.   Right-handed.   1 can of [facility] per day.   Family History: Family History  Problem Relation Age of Onset  . Cancer Mother        Marena Chancy - thinks it started in her lungs.  . Cancer Father        Unsure of type.  . Diabetes Maternal Grandfather   . Diabetes Maternal Aunt   . Hypertension Neg Hx    Allergies: No Known Allergies Medications: See  med rec.  Review of Systems: No fevers, chills, night sweats, weight loss, chest pain, or shortness of breath.   Objective:    General: Well Developed, well nourished, and in no acute distress.  Neuro: Alert and oriented x3, extra-ocular muscles intact, sensation grossly intact.  HEENT: Normocephalic, atraumatic, pupils equal round reactive to light, neck supple, no masses, no lymphadenopathy, thyroid nonpalpable.  Oropharynx, nasopharynx, ear canals unremarkable Skin: Warm and dry, no rashes. Cardiac: Regular rate and rhythm, no murmurs rubs or gallops, no lower extremity edema.  Respiratory: Coarse lower lobe lung sounds right worse than left. Not using accessory muscles, speaking in full sentences.  Impression and Recommendations:    Influenza-like illness He is right around the window for Tamiflu, also he will take TheraFlu for symptomatic relief. Out of work tomorrow. Coarse sounds in the lower lung fields, right worse than left, adding a chest x-ray.  Annual physical exam Due for colon cancer screening, adding colonoscopy  I spent 25 minutes with this patient, greater than 50% was face-to-face time counseling regarding the above diagnoses ___________________________________________ Gwen Her. Dianah Field, M.D., ABFM., CAQSM. Primary Care and [place] Instructor of [place] [facility]

## 2017-09-02 NOTE — Assessment & Plan Note (Signed)
Due for colon cancer screening, adding colonoscopy

## 2017-09-02 NOTE — Assessment & Plan Note (Signed)
He is right around the window for Tamiflu, also he will take TheraFlu for symptomatic relief. Out of work tomorrow. Coarse sounds in the lower lung fields, right worse than left, adding a chest x-ray.

## 2017-09-03 ENCOUNTER — Ambulatory Visit (INDEPENDENT_AMBULATORY_CARE_PROVIDER_SITE_OTHER): Payer: BLUE CROSS/BLUE SHIELD

## 2017-09-03 DIAGNOSIS — N200 Calculus of kidney: Secondary | ICD-10-CM

## 2017-09-03 DIAGNOSIS — D751 Secondary polycythemia: Secondary | ICD-10-CM

## 2017-09-04 ENCOUNTER — Telehealth: Payer: Self-pay | Admitting: *Deleted

## 2017-09-04 NOTE — Telephone Encounter (Addendum)
Patient is aware of results  ----- Message from Volanda Napoleon, MD sent at 09/03/2017  5:17 PM EDT ----- CALL - THERE IS A NON-OBSTRUCTING KIDNEY STONE IN THE LEFT KIDNEY.  THIS SHOULD NOT MAKE YOUR KIDNEYS WEAK!!  WHEN DOES HE COME BACK FOR LABS???  [employer]

## 2017-09-05 ENCOUNTER — Other Ambulatory Visit: Payer: BLUE CROSS/BLUE SHIELD

## 2017-09-19 ENCOUNTER — Inpatient Hospital Stay: Payer: BLUE CROSS/BLUE SHIELD | Admitting: Hematology & Oncology

## 2017-09-19 ENCOUNTER — Inpatient Hospital Stay: Payer: BLUE CROSS/BLUE SHIELD

## 2017-11-05 ENCOUNTER — Other Ambulatory Visit: Payer: Self-pay

## 2017-11-05 DIAGNOSIS — E349 Endocrine disorder, unspecified: Secondary | ICD-10-CM

## 2017-11-05 MED ORDER — TESTOSTERONE CYPIONATE 200 MG/ML IM SOLN: INTRAMUSCULAR | 0 refills | Status: DC

## 2017-11-27 ENCOUNTER — Other Ambulatory Visit: Payer: Self-pay | Admitting: Sports Medicine

## 2017-11-27 DIAGNOSIS — I10 Essential (primary) hypertension: Secondary | ICD-10-CM

## 2017-11-27 DIAGNOSIS — M1000 Idiopathic gout, unspecified site: Secondary | ICD-10-CM

## 2017-12-21 ENCOUNTER — Other Ambulatory Visit: Payer: Self-pay | Admitting: Sports Medicine

## 2017-12-21 DIAGNOSIS — N5201 Erectile dysfunction due to arterial insufficiency: Secondary | ICD-10-CM

## 2017-12-24 ENCOUNTER — Other Ambulatory Visit: Payer: Self-pay | Admitting: Sports Medicine

## 2017-12-24 DIAGNOSIS — I10 Essential (primary) hypertension: Secondary | ICD-10-CM

## 2017-12-28 ENCOUNTER — Other Ambulatory Visit: Payer: Self-pay | Admitting: Sports Medicine

## 2017-12-28 DIAGNOSIS — I10 Essential (primary) hypertension: Secondary | ICD-10-CM

## 2018-03-01 ENCOUNTER — Other Ambulatory Visit: Payer: Self-pay | Admitting: Sports Medicine

## 2018-03-01 DIAGNOSIS — E349 Endocrine disorder, unspecified: Secondary | ICD-10-CM

## 2018-03-12 ENCOUNTER — Other Ambulatory Visit: Payer: Self-pay | Admitting: Sports Medicine

## 2018-03-12 DIAGNOSIS — I10 Essential (primary) hypertension: Secondary | ICD-10-CM

## 2018-03-16 ENCOUNTER — Other Ambulatory Visit: Payer: Self-pay | Admitting: Sports Medicine

## 2018-03-16 DIAGNOSIS — N5201 Erectile dysfunction due to arterial insufficiency: Secondary | ICD-10-CM

## 2018-03-25 ENCOUNTER — Ambulatory Visit (INDEPENDENT_AMBULATORY_CARE_PROVIDER_SITE_OTHER): Payer: BLUE CROSS/BLUE SHIELD | Admitting: Family Medicine

## 2018-03-25 ENCOUNTER — Encounter: Payer: Self-pay | Admitting: Family Medicine

## 2018-03-25 VITALS — BP 142/85 | HR 64 | Ht 77.0 in | Wt 268.0 lb

## 2018-03-25 DIAGNOSIS — M549 Dorsalgia, unspecified: Secondary | ICD-10-CM | POA: Diagnosis not present

## 2018-03-25 LAB — POCT URINALYSIS DIPSTICK
Bilirubin, UA: NEGATIVE
GLUCOSE UA: NEGATIVE
KETONES UA: NEGATIVE
LEUKOCYTES UA: NEGATIVE
NITRITE UA: NEGATIVE
Protein, UA: NEGATIVE
RBC UA: NEGATIVE
SPEC GRAV UA: 1.02 (ref 1.010–1.025)
Urobilinogen, UA: 0.2 E.U./dL
pH, UA: 6 (ref 5.0–8.0)

## 2018-03-25 MED ORDER — CYCLOBENZAPRINE HCL 10 MG PO TABS: 10.0000 mg | ORAL_TABLET | Freq: Three times a day (TID) | ORAL | 0 refills | Status: DC | PRN

## 2018-03-25 NOTE — Progress Notes (Signed)
Jonathan Oliver. is a 52 y.o. male who presents to [facility] today for back pain.  Jonathan Oliver notes a 1 week history of pain in the low back.  He notes pain occurs in the right lower back predominantly.  He denies any radiating pain weakness or numbness.  He denies any injury.  He denies any bowel or bladder dysfunction.  He is tried ibuprofen and Aleve ice which have helped only a little.  Additionally he is been seen by chiropractor and had e-stim and some chiropractic adjustment which helped a little.  He feels reasonably well otherwise and notes the pain in his back is moderately bothersome and does interfere with his work and life.  Pain is worse with activity better with rest.  He has a history of left-sided kidney stone.  He denies any blood in the and and notes that his pain is not typically consistent with pain is had with kidney stones in the past.   ROS:  As above  Exam:  BP (!) 142/85   Pulse 64   Ht 6\' 5"  (1.956 m)   Wt 268 lb (121.6 kg)   BMI 31.78 kg/m  General: Well Developed, well nourished, and in no acute distress.  Neuro/Psych: Alert and oriented x3, extra-ocular muscles intact, able to move all 4 extremities, sensation grossly intact. Skin: Warm and dry, no rashes noted.  Respiratory: Not using accessory muscles, speaking in full sentences, trachea midline.  Cardiovascular: Pulses palpable, no extremity edema. Abdomen: Does not appear distended.  No CV angle tenderness to percussion MSK:  C-spine: Nontender normal motion T-spine nontender. L-spine: Nontender to spinal midline.  Tender palpation right lumbar paraspinal muscle group. Lumbar motion normal flexion extension pain and limited motion with right rotation and lateral flexion.  Normal left. Lower extremity strength reflexes and sensation are equal normal throughout. Lower extreme strength reflexes and sensation are equal normal throughout    Lab and Radiology  Results Results for orders placed or performed in visit on 03/25/18 (from the past 72 hour(s))  POCT Urinalysis Dipstick     Status: None   Collection Time: 03/25/18  9:29 AM  Result Value Ref Range   Color, UA yellow    Clarity, UA clear    Glucose, UA Negative Negative   Bilirubin, UA negative    Ketones, UA negative    Spec Grav, UA 1.020 1.010 - 1.025   Blood, UA negative    pH, UA 6.0 5.0 - 8.0   Protein, UA Negative Negative   Urobilinogen, UA 0.2 0.2 or 1.0 E.U./dL   Nitrite, UA negative    Leukocytes, UA Negative Negative   Appearance     Odor     No results found.     Assessment and Plan: 52 y.o. male with  Lumbosacral strain.  Very likely myofascial disruption and strain.  Plan for treatment with physical therapy, cyclobenzaprine and NSAIDs.  Additionally use heating pad and TENS unit.  Recheck if not improving.  Kidney stone very unlikely given different quality of pain as well as absence of blood in the urine.  Recheck sooner if needed.    Orders Placed This Encounter  Procedures  . Ambulatory referral to Physical Therapy    Referral Priority:   Routine    Referral Type:   Physical Medicine    Referral Reason:   Specialty Services Required    Requested Specialty:   Physical Therapy  . POCT Urinalysis Dipstick   Meds  ordered this encounter  Medications  . cyclobenzaprine (FLEXERIL) 10 MG tablet    Sig: Take 1 tablet (10 mg total) by mouth 3 (three) times daily as needed for muscle spasms.    Dispense:  30 tablet    Refill:  0    Historical information moved to improve visibility of documentation.  Past Medical History:  Diagnosis Date  . Gout   . Headache   . Hypertension   . IFG (impaired fasting glucose)   . Kidney stones   . MRSA (methicillin resistant Staphylococcus aureus)    Past Surgical History:  Procedure Laterality Date  . ELBOW SURGERY Right    x 2  . sinus surgey    . uvuloplasty for OSA     Social History   Tobacco Use  .  Smoking status: Never Smoker  . Smokeless tobacco: Never Used  Substance Use Topics  . Alcohol use: Yes    Alcohol/week: 42.0 standard drinks    Types: 42 Cans of beer per week    Comment: 6-8 beers a day   family history includes Cancer in his father and mother; Diabetes in his maternal aunt and maternal grandfather.  Medications: Current Outpatient Medications  Medication Sig Dispense Refill  . allopurinol (ZYLOPRIM) 300 MG tablet TAKE 1 TABLET BY MOUTH EVERY DAY 90 tablet 1  . amLODipine (NORVASC) 10 MG tablet TAKE 1 TABLET BY MOUTH EVERY DAY 90 tablet 1  . diclofenac sodium (VOLTAREN) 1 % GEL Apply 4 g topically 4 (four) times daily. To affected joint. 100 g 11  . meloxicam (MOBIC) 15 MG tablet One tab PO qAM with breakfast for 2 weeks, then daily prn pain. 30 tablet 3  . metoprolol tartrate (LOPRESSOR) 100 MG tablet TAKE 2 TABLETS (200 MG TOTAL) BY MOUTH 2 (TWO) TIMES DAILY. 360 tablet 1  . metoprolol tartrate (LOPRESSOR) 100 MG tablet TAKE 2 TABLETS (200 MG TOTAL) BY MOUTH 2 (TWO) TIMES DAILY. 120 tablet 1  . oseltamivir (TAMIFLU) 75 MG capsule Take 1 capsule (75 mg total) by mouth 2 (two) times daily. 10 capsule 0  . sildenafil (REVATIO) 20 MG tablet TAKE 5 TABLETS BY MOUTH IF NEEDED 50 tablet 1  . testosterone cypionate (DEPOTESTOSTERONE CYPIONATE) 200 MG/ML injection INJECT 1 ML EVERY 14 DAYS 6 mL 1  . THERAFLU SEVERE COLD/CGH DAY 20-10-650 MG PACK One dose every 6-8 hours 100 each 0  . valsartan-hydrochlorothiazide (DIOVAN-HCT) 320-25 MG tablet TAKE 1 TABLET BY MOUTH EVERY DAY 90 tablet 1  . cyclobenzaprine (FLEXERIL) 10 MG tablet Take 1 tablet (10 mg total) by mouth 3 (three) times daily as needed for muscle spasms. 30 tablet 0   No current facility-administered medications for this visit.    No Known Allergies    Discussed warning signs or symptoms. Please see discharge instructions. Patient expresses understanding.

## 2018-03-25 NOTE — Patient Instructions (Addendum)
Thank you for coming in today. I think this is a pulled muscle in your back.  Use the heating pad and TENS unit.  Attend PT . Use muscle relaxer at bedtime as needed.  Recheck with me or Dr T in a few weeks if not better.     TENS UNIT: This is helpful for muscle pain and spasm.   Search and Purchase a TENS 7000 2nd edition at  www.tenspros.com or www.[employer].com It should be less than $30.     TENS unit instructions: Do not shower or bathe with the unit on Turn the unit off before removing electrodes or batteries If the electrodes lose stickiness add a drop of water to the electrodes after they are disconnected from the unit and place on plastic sheet. If you continued to have difficulty, call the TENS unit company to purchase more electrodes. Do not apply lotion on the skin area prior to use. Make sure the skin is clean and dry as this will help prolong the life of the electrodes. After use, always check skin for unusual red areas, rash or other skin difficulties. If there are any skin problems, does not apply electrodes to the same area. Never remove the electrodes from the unit by pulling the wires. Do not use the TENS unit or electrodes other than as directed. Do not change electrode placement without consultating your therapist or physician. Keep 2 fingers with between each electrode. Wear time ratio is 2:1, on to off times.    For example on for 30 minutes off for 15 minutes and then on for 30 minutes off for 15 minutes   Lumbosacral Strain Lumbosacral strain is an injury that causes pain in the lower back (lumbosacral spine). This injury usually occurs from overstretching the muscles or ligaments along your spine. A strain can affect one or more muscles or cord-like tissues that connect bones to other bones (ligaments). What are the causes? This condition may be caused by:  A hard, direct hit (blow) to the back.  Excessive stretching of the lower back muscles. This may  result from: ? A fall. ? Lifting something heavy. ? Repetitive movements such as bending or crouching.  What increases the risk? The following factors may increase your risk of getting this condition:  Participating in sports or activities that involve: ? A sudden twist of the back. ? Pushing or pulling motions.  Being overweight or obese.  Having poor strength and flexibility, especially tight hamstrings or weak muscles in the back or abdomen.  Having too much of a curve in the lower back.  Having a pelvis that is tilted forward.  What are the signs or symptoms? The main symptom of this condition is pain in the lower back, at the site of the strain. Pain may extend (radiate) down one or both legs. How is this diagnosed? This condition is diagnosed based on:  Your symptoms.  Your medical history.  A physical exam. ? Your health care provider may push on certain areas of your back to determine the source of your pain. ? You may be asked to bend forward, backward, and side to side to assess the severity of your pain and your range of motion.  Imaging tests, such as: ? X-rays. ? MRI.  How is this treated? Treatment for this condition may include:  Putting heat and cold on the affected area.  Medicines to help relieve pain and relax your muscles (muscle relaxants).  NSAIDs to help reduce swelling and  discomfort.  When your symptoms improve, it is important to gradually return to your normal routine as soon as possible to reduce pain, avoid stiffness, and avoid loss of muscle strength. Generally, symptoms should improve within 6 weeks of treatment. However, recovery time varies. Follow these instructions at home: Managing pain, stiffness, and swelling   If directed, put ice on the injured area during the first 24 hours after your strain. ? Put ice in a plastic bag. ? Place a towel between your skin and the bag. ? Leave the ice on for 20 minutes, 2-3 times a day.  If  directed, put heat on the affected area as often as told by your health care provider. Use the heat source that your health care provider recommends, such as a moist heat pack or a heating pad. ? Place a towel between your skin and the heat source. ? Leave the heat on for 20-30 minutes. ? Remove the heat if your skin turns bright red. This is especially important if you are unable to feel pain, heat, or cold. You may have a greater risk of getting burned. Activity  Rest and return to your normal activities as told by your health care provider. Ask your health care provider what activities are safe for you.  Avoid activities that take a lot of energy for as long as told by your health care provider. General instructions  Take over-the-counter and prescription medicines only as told by your health care provider.  Donot drive or use heavy machinery while taking prescription pain medicine.  Do not use any products that contain nicotine or tobacco, such as cigarettes and e-cigarettes. If you need help quitting, ask your health care provider.  Keep all follow-up visits as told by your health care provider. This is important. How is this prevented?  Use correct form when playing sports and lifting heavy objects.  Use good posture when sitting and standing.  Maintain a healthy weight.  Sleep on a mattress with medium firmness to support your back.  Be safe and responsible while being active to avoid falls.  Do at least 150 minutes of moderate-intensity exercise each week, such as brisk walking or water aerobics. Try a form of exercise that takes stress off your back, such as swimming or stationary cycling.  Maintain physical fitness, including: ? Strength. ? Flexibility. ? Cardiovascular fitness. ? Endurance. Contact a health care provider if:  Your back pain does not improve after 6 weeks of treatment.  Your symptoms get worse. Get help right away if:  Your back pain is  severe.  You cannot stand or walk.  You have difficulty controlling when you urinate or when you have a bowel movement.  You feel nauseous or you vomit.  Your feet get very cold.  You have numbness, tingling, weakness, or problems using your arms or legs.  You develop any of the following: ? Shortness of breath. ? Dizziness. ? Pain in your legs. ? Weakness in your buttocks or legs. ? Discoloration of the skin on your toes or legs. This information is not intended to replace advice given to you by your health care provider. Make sure you discuss any questions you have with your health care provider. Document Released: 03/20/2005 Document Revised: 12/29/2015 Document Reviewed: 11/12/2015 Elsevier Interactive Patient Education  [employer].

## 2018-03-26 ENCOUNTER — Ambulatory Visit: Payer: BLUE CROSS/BLUE SHIELD | Admitting: Sports Medicine

## 2018-05-06 ENCOUNTER — Other Ambulatory Visit: Payer: Self-pay | Admitting: Sports Medicine

## 2018-05-06 DIAGNOSIS — I10 Essential (primary) hypertension: Secondary | ICD-10-CM

## 2018-05-26 ENCOUNTER — Other Ambulatory Visit: Payer: Self-pay | Admitting: Sports Medicine

## 2018-05-26 DIAGNOSIS — I10 Essential (primary) hypertension: Secondary | ICD-10-CM

## 2018-05-26 DIAGNOSIS — M1000 Idiopathic gout, unspecified site: Secondary | ICD-10-CM

## 2018-06-28 ENCOUNTER — Other Ambulatory Visit: Payer: Self-pay | Admitting: Sports Medicine

## 2018-06-28 DIAGNOSIS — E349 Endocrine disorder, unspecified: Secondary | ICD-10-CM

## 2018-06-28 DIAGNOSIS — N5201 Erectile dysfunction due to arterial insufficiency: Secondary | ICD-10-CM

## 2018-06-29 ENCOUNTER — Encounter: Payer: Self-pay | Admitting: Family Medicine

## 2018-06-29 ENCOUNTER — Ambulatory Visit (INDEPENDENT_AMBULATORY_CARE_PROVIDER_SITE_OTHER): Payer: BLUE CROSS/BLUE SHIELD | Admitting: Family Medicine

## 2018-06-29 VITALS — BP 131/85 | HR 74 | Ht 77.0 in | Wt 269.0 lb

## 2018-06-29 DIAGNOSIS — L739 Follicular disorder, unspecified: Secondary | ICD-10-CM | POA: Diagnosis not present

## 2018-06-29 MED ORDER — DOXYCYCLINE HYCLATE 100 MG PO TABS: 100.0000 mg | ORAL_TABLET | Freq: Two times a day (BID) | ORAL | 0 refills | Status: DC

## 2018-06-29 MED ORDER — MUPIROCIN 2 % EX OINT: TOPICAL_OINTMENT | CUTANEOUS | 3 refills | Status: DC

## 2018-06-29 NOTE — Patient Instructions (Addendum)
Thank you for coming in today. I think this may be MRSA.  Take the oral doxycycline twice daily for 1 week.  Do not take with dairy, iron or calcium.  OK to take with a little food.   Use the antibiotic ointment on the wounds and in the nose for about 1 week.  Use the over the counter chlorhexidine body wash (Hibiclens) in the shower daily for about 1 week.   If the infection on the back of the head worsens and comes to a head return for drainage with Dr T or myself.    Folliculitis  Folliculitis is inflammation of the hair follicles. Folliculitis most commonly occurs on the scalp, thighs, legs, back, and buttocks. However, it can occur anywhere on the body. What are the causes? This condition may be caused by:  A bacterial infection (common).  A fungal infection.  A viral infection.  Coming into contact with certain chemicals, especially oils and tars.  Shaving or waxing.  Applying greasy ointments or creams to your skin often. Long-lasting folliculitis and folliculitis that keeps coming back can be caused by bacteria that live in the nostrils. What increases the risk? This condition is more likely to develop in people with:  A weakened immune system.  Diabetes.  Obesity. What are the signs or symptoms? Symptoms of this condition include:  Redness.  Soreness.  Swelling.  Itching.  Small white or yellow, pus-filled, itchy spots (pustules) that appear over a reddened area. If there is an infection that goes deep into the follicle, these may develop into a boil (furuncle).  A group of closely packed boils (carbuncle). These tend to form in hairy, sweaty areas of the body. How is this diagnosed? This condition is diagnosed with a skin exam. To find what is causing the condition, your health care provider may take a sample of one of the pustules or boils for testing. How is this treated? This condition may be treated by:  Applying warm compresses to the affected  areas.  Taking an antibiotic medicine or applying an antibiotic medicine to the skin.  Applying or bathing with an antiseptic solution.  Taking an over-the-counter medicine to help with itching.  Having a procedure to drain any pustules or boils. This may be done if a pustule or boil contains a lot of pus or fluid.  Laser hair removal. This may be done to treat long-lasting folliculitis. Follow these instructions at home:  If directed, apply heat to the affected area as often as told by your health care provider. Use the heat source that your health care provider recommends, such as a moist heat pack or a heating pad. ? Place a towel between your skin and the heat source. ? Leave the heat on for 20-30 minutes. ? Remove the heat if your skin turns bright red. This is especially important if you are unable to feel pain, heat, or cold. You may have a greater risk of getting burned.  If you were prescribed an antibiotic medicine, use it as told by your health care provider. Do not stop using the antibiotic even if you start to feel better.  Take over-the-counter and prescription medicines only as told by your health care provider.  Do not shave irritated skin.  Keep all follow-up visits as told by your health care provider. This is important. Get help right away if:  You have more redness, swelling, or pain in the affected area.  Red streaks are spreading from the affected area.  You have a fever. This information is not intended to replace advice given to you by your health care provider. Make sure you discuss any questions you have with your health care provider. Document Released: 08/19/2001 Document Revised: 12/29/2015 Document Reviewed: 03/31/2015 Elsevier Interactive Patient Education  2019 [employer]   Skin Abscess  A skin abscess is an infected area on or under your skin that contains a collection of pus and other material. An abscess may also be called a furuncle,  carbuncle, or boil. An abscess can occur in or on almost any part of your body. Some abscesses break open (rupture) on their own. Most continue to get worse unless they are treated. The infection can spread deeper into the body and eventually into your blood, which can make you feel ill. Treatment usually involves draining the abscess. What are the causes? An abscess occurs when germs, like bacteria, pass through your skin and cause an infection. This may be caused by:  A scrape or cut on your skin.  A puncture wound through your skin, including a needle injection or insect bite.  Blocked oil or sweat glands.  Blocked and infected hair follicles.  A cyst that forms beneath your skin (sebaceous cyst) and becomes infected. What increases the risk? This condition is more likely to develop in people who:  Have a weak body defense system (immune system).  Have diabetes.  Have dry and irritated skin.  Get frequent injections or use illegal IV drugs.  Have a foreign body in a wound, such as a splinter.  Have problems with their lymph system or veins. What are the signs or symptoms? Symptoms of this condition include:  A painful, firm bump under the skin.  A bump with pus at the top. This may break through the skin and drain. Other symptoms include:  Redness surrounding the abscess site.  Warmth.  Swelling of the lymph nodes (glands) near the abscess.  Tenderness.  A sore on the skin. How is this diagnosed? This condition may be diagnosed based on:  A physical exam.  Your medical history.  A sample of pus. This may be used to find out what is causing the infection.  Blood tests.  Imaging tests, such as an ultrasound, CT scan, or MRI. How is this treated? A small abscess that drains on its own may not need treatment. Treatment for larger abscesses may include:  Moist heat or heat pack applied to the area several times a day.  A procedure to drain the abscess  (incision and drainage).  Antibiotic medicines. For a severe abscess, you may first get antibiotics through an IV and then change to antibiotics by mouth. Follow these instructions at home: Medicines   Take over-the-counter and prescription medicines only as told by your health care provider.  If you were prescribed an antibiotic medicine, take it as told by your health care provider. Do not stop taking the antibiotic even if you start to feel better. Abscess care   If you have an abscess that has not drained, apply heat to the affected area. Use the heat source that your health care provider recommends, such as a moist heat pack or a heating pad. ? Place a towel between your skin and the heat source. ? Leave the heat on for 20-30 minutes. ? Remove the heat if your skin turns bright red. This is especially important if you are unable to feel pain, heat, or cold. You may have a greater risk of getting  burned.  Follow instructions from your health care provider about how to take care of your abscess. Make sure you: ? Cover the abscess with a bandage (dressing). ? Change your dressing or gauze as told by your health care provider. ? Wash your hands with soap and water before you change the dressing or gauze. If soap and water are not available, use hand sanitizer.  Check your abscess every day for signs of a worsening infection. Check for: ? More redness, swelling, or pain. ? More fluid or blood. ? Warmth. ? More pus or a bad smell. General instructions  To avoid spreading the infection: ? Do not share personal care items, towels, or hot tubs with others. ? Avoid making skin contact with other people.  Keep all follow-up visits as told by your health care provider. This is important. Contact a health care provider if you have:  More redness, swelling, or pain around your abscess.  More fluid or blood coming from your abscess.  Warm skin around your abscess.  More pus or a bad  smell coming from your abscess.  A fever.  Muscle aches.  Chills or a general ill feeling. Get help right away if you:  Have severe pain.  See red streaks on your skin spreading away from the abscess. Summary  A skin abscess is an infected area on or under your skin that contains a collection of pus and other material.  A small abscess that drains on its own may not need treatment.  Treatment for larger abscesses may include having a procedure to drain the abscess and taking an antibiotic. This information is not intended to replace advice given to you by your health care provider. Make sure you discuss any questions you have with your health care provider. Document Released: 03/20/2005 Document Revised: 07/24/2017 Document Reviewed: 07/24/2017 Elsevier Interactive Patient Education  2019 [employer].

## 2018-06-29 NOTE — Progress Notes (Signed)
Jonathan Oliver. is a 53 y.o. male who presents to [facility] today for folliculitis/abscess.  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  Over the last several days he has noted several small erythematous papules on his trunk and extremities consistent with folliculitis.  Yesterday developed a painful bump on posterior scalp that is becoming larger.  He is concerned this may develop into an abscess in the future.  He like to get started on early treatment now is that is worked sometime in the past.  He is never had to his knowledge culture with resistance typing for his skin cultures before.  No fevers or chills vomiting or diarrhea.  He is tried some over-the-counter antibiotic ointment which helped a little.  ROS as above:  Exam:  BP 131/85   Pulse 74   Ht 6\' 5"  (1.956 m)   Wt 269 lb (122 kg)   BMI 31.90 kg/m  Wt Readings from Last 5 Encounters:  06/29/18 269 lb (122 kg)  03/25/18 268 lb (121.6 kg)  09/02/17 260 lb (117.9 kg)  08/25/17 267 lb (121.1 kg)  08/22/17 268 lb 12 oz (121.9 kg)    Gen: Well NAD HEENT: EOMI,  MMM Lungs: Normal work of breathing. CTABL Heart: RRR no MRG Abd: NABS, Soft. Nondistended, Nontender Exts: Brisk capillary refill, warm and well perfused.  Skin: Posterior scalp quarter sized area of erythema and induration with no fluctuance. Several small papules present on trunk and extremities as well with no surrounding erythema or induration.  No fluctuance palpated.  Lab and Radiology Results No results found for this or any previous visit (from the past 72 hour(s)). No results found.    Assessment and Plan: 54 y.o. male with  Folliculitis with developing abscess posterior scalp.  Scalp today is not ready for drainage.  I am hopeful that we will be able to suppress this with oral antibiotics.  Plan  to use doxycycline as well as chlorhexidine body wash and mupirocin antibiotic ointment for suppression and eradication.  Recheck if not improving or if worsening.  The posterior scalp infection is just at the inflection point where he may start developing into an abscess that is no longer treatable with oral antibiotics however is not ready for drainage today.  PDMP not reviewed this encounter. No orders of the defined types were placed in this encounter.  Meds ordered this encounter  Medications  . doxycycline (VIBRA-TABS) 100 MG tablet    Sig: Take 1 tablet (100 mg total) by mouth 2 (two) times daily.    Dispense:  14 tablet    Refill:  0  . mupirocin ointment (BACTROBAN) 2 %    Sig: Apply to affected area BID for 7 days.    Dispense:  30 g    Refill:  3     Historical information moved to improve visibility of documentation.  Past Medical History:  Diagnosis Date  . Gout   . Headache   . Hypertension   . IFG (impaired fasting glucose)   . Kidney stones   . MRSA (methicillin resistant Staphylococcus aureus)    Past Surgical History:  Procedure Laterality Date  . ELBOW SURGERY Right    x 2  . sinus surgey    . uvuloplasty for OSA     Social History   Tobacco Use  . Smoking status: Never Smoker  . Smokeless tobacco: Never Used  Substance Use Topics  . Alcohol use: Yes    Alcohol/week: 42.0 standard drinks    Types: 42 Cans of beer per week    Comment: 6-8 beers a day   family history includes Cancer in his father and mother; Diabetes in his maternal aunt and maternal grandfather.  Medications: Current Outpatient Medications  Medication Sig Dispense Refill  . allopurinol (ZYLOPRIM) 300 MG tablet TAKE 1 TABLET BY MOUTH EVERY DAY 90 tablet 1  . amLODipine (NORVASC) 10 MG tablet TAKE 1 TABLET BY MOUTH EVERY DAY 90 tablet 1  . cyclobenzaprine (FLEXERIL) 10 MG tablet Take 1 tablet (10 mg total) by mouth 3 (three) times daily as needed for muscle spasms. 30 tablet 0  .  diclofenac sodium (VOLTAREN) 1 % GEL Apply 4 g topically 4 (four) times daily. To affected joint. 100 g 11  . meloxicam (MOBIC) 15 MG tablet One tab PO qAM with breakfast for 2 weeks, then daily prn pain. 30 tablet 3  . metoprolol tartrate (LOPRESSOR) 100 MG tablet TAKE 2 TABLETS (200 MG TOTAL) BY MOUTH 2 (TWO) TIMES DAILY. 360 tablet 1  . metoprolol tartrate (LOPRESSOR) 100 MG tablet TAKE 2 TABLETS (200 MG TOTAL) BY MOUTH 2 (TWO) TIMES DAILY. 120 tablet 1  . sildenafil (REVATIO) 20 MG tablet TAKE 5 TABLETS BY MOUTH IF NEEDED 50 tablet 1  . testosterone cypionate (DEPOTESTOSTERONE CYPIONATE) 200 MG/ML injection INJECT 1 ML EVERY 14 DAYS 6 mL 1  . THERAFLU SEVERE COLD/CGH DAY 20-10-650 MG PACK One dose every 6-8 hours 100 each 0  . valsartan-hydrochlorothiazide (DIOVAN-HCT) 320-25 MG tablet TAKE 1 TABLET BY MOUTH EVERY DAY 90 tablet 1  . doxycycline (VIBRA-TABS) 100 MG tablet Take 1 tablet (100 mg total) by mouth 2 (two) times daily. 14 tablet 0  . mupirocin ointment (BACTROBAN) 2 % Apply to affected area BID for 7 days. 30 g 3   No current facility-administered medications for this visit.    No Known Allergies   Discussed warning signs or symptoms. Please see discharge instructions. Patient expresses understanding.

## 2018-07-01 MED ORDER — GENERIC EXTERNAL MEDICATION
10.00 | Status: DC
Start: ? — End: 2018-07-01

## 2018-07-01 MED ORDER — SODIUM CHLORIDE 0.9 % IV SOLN
10.00 | INTRAVENOUS | Status: DC
Start: ? — End: 2018-07-01

## 2018-07-03 ENCOUNTER — Encounter: Payer: Self-pay | Admitting: Sports Medicine

## 2018-07-03 ENCOUNTER — Ambulatory Visit (INDEPENDENT_AMBULATORY_CARE_PROVIDER_SITE_OTHER): Payer: BLUE CROSS/BLUE SHIELD | Admitting: Sports Medicine

## 2018-07-03 DIAGNOSIS — F1010 Alcohol abuse, uncomplicated: Secondary | ICD-10-CM

## 2018-07-03 DIAGNOSIS — E1149 Type 2 diabetes mellitus with other diabetic neurological complication: Secondary | ICD-10-CM

## 2018-07-03 DIAGNOSIS — B351 Tinea unguium: Secondary | ICD-10-CM | POA: Diagnosis not present

## 2018-07-03 DIAGNOSIS — E669 Obesity, unspecified: Secondary | ICD-10-CM | POA: Diagnosis not present

## 2018-07-03 HISTORY — DX: Tinea unguium: B35.1

## 2018-07-03 HISTORY — DX: Type 2 diabetes mellitus with other diabetic neurological complication: E11.49

## 2018-07-03 HISTORY — DX: Alcohol abuse, uncomplicated: F10.10

## 2018-07-03 MED ORDER — SEMAGLUTIDE (1 MG/DOSE) 2 MG/1.5ML ~~LOC~~ SOPN: 1.0000 mg | PEN_INJECTOR | SUBCUTANEOUS | 11 refills | Status: DC

## 2018-07-03 MED ORDER — TERBINAFINE HCL 250 MG PO TABS: 250.0000 mg | ORAL_TABLET | Freq: Every day | ORAL | 1 refills | Status: DC

## 2018-07-03 MED ORDER — SEMAGLUTIDE(0.25 OR 0.5MG/DOS) 2 MG/1.5ML ~~LOC~~ SOPN: 0.5000 mg | PEN_INJECTOR | SUBCUTANEOUS | 11 refills | Status: DC

## 2018-07-03 NOTE — Progress Notes (Addendum)
Subjective:    CC: Hospital follow-up  HPI: Jonathan Oliver was seen in the hospital recently, noted to have glycosuria as well as an elevated blood sugar over 300.  He was started on metformin and he is referred here for further evaluation.  In further questioning he is noticing polyuria, occasional headaches.  He does have some tingling and numbness on his feet.  Thickened and yellow toenails.  I reviewed the past medical history, family history, social history, surgical history, and allergies today and no changes were needed.  Please see the problem list section below in epic for further details.  Past Medical History: Past Medical History:  Diagnosis Date  . Gout   . Headache   . Hypertension   . IFG (impaired fasting glucose)   . Kidney stones   . MRSA (methicillin resistant Staphylococcus aureus)    Past Surgical History: Past Surgical History:  Procedure Laterality Date  . ELBOW SURGERY Right    x 2  . sinus surgey    . uvuloplasty for OSA     Social History: Social History   Socioeconomic History  . Marital status: Married    Spouse name: Not on file  . Number of children: 3  . Years of education: HS  . Highest education level: Not on file  Occupational History  . Occupation: Maintenance  Social Needs  . Financial resource strain: Not on file  . Food insecurity:    Worry: Not on file    Inability: Not on file  . Transportation needs:    Medical: Not on file    Non-medical: Not on file  Tobacco Use  . Smoking status: Never Smoker  . Smokeless tobacco: Never Used  Substance and Sexual Activity  . Alcohol use: Yes    Alcohol/week: 42.0 standard drinks    Types: 42 Cans of beer per week    Comment: 6-8 beers a day  . Drug use: No  . Sexual activity: Not on file  Lifestyle  . Physical activity:    Days per week: Not on file    Minutes per session: Not on file  . Stress: Not on file  Relationships  . Social connections:    Talks on phone: Not on file    Gets  together: Not on file    Attends religious service: Not on file    Active member of club or organization: Not on file    Attends meetings of clubs or organizations: Not on file    Relationship status: Not on file  Other Topics Concern  . Not on file  Social History Narrative   Lives at home with his spouse.   Right-handed.   1 can of [facility] per day.   Family History: Family History  Problem Relation Age of Onset  . Cancer Mother        Jonathan Oliver - thinks it started in her lungs.  . Cancer Father        Unsure of type.  . Diabetes Maternal Grandfather   . Diabetes Maternal Aunt   . Hypertension Neg Hx    Allergies: No Known Allergies Medications: See med rec.  Review of Systems: No fevers, chills, night sweats, weight loss, chest pain, or shortness of breath.   Objective:    General: Well Developed, well nourished, and in no acute distress.  Neuro: Alert and oriented x3, extra-ocular muscles intact, sensation grossly intact.  HEENT: Normocephalic, atraumatic, pupils equal round reactive to light, neck supple, no masses, no  lymphadenopathy, thyroid nonpalpable.  Skin: Warm and dry, no rashes.  Toenails are thick and yellow. Cardiac: Regular rate and rhythm, no murmurs rubs or gallops, no lower extremity edema.  Respiratory: Clear to auscultation bilaterally. Not using accessory muscles, speaking in full sentences.  Hemoglobin A1c fingerstick attempted twice, it is telling us his blood hemoglobin is too high for the test to work.  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 A1cs cannot be done point-of-care due to his elevated hemoglobin levels. Continue metformin, starting Ozempic. He knows to only start the shot once we get his A1c levels in the lab. When he returns we will do his pneumococcal vaccine, referral placed for diabetic eye exam. Return to see me in 3 months.  A1c is 7.6%.   Not terrible but we can get it way down with the ozempic.  This will also help him to lose some weight and due to its side effect of early satiety possibly decrease the amount of beer he can stomach.  Onychomycosis Starting Lamisil. Checking CMP.  Obesity Metabolic syndrome and diabetes mellitus type 2. Ozempic will help him lose significant weight.  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 He is going to cut down to 3-4 beers daily, if insufficient improvement we will add Campral.  I spent 40 minutes with this patient, greater than 50% was face-to-face time counseling regarding the above diagnoses, specifically discussing the pathophysiology and treatment approach to new onset diabetes type 2. ___________________________________________ Gwen Her. Dianah Field, M.D., ABFM., CAQSM. Primary Care and Sports Medicine [facility]  Adjunct Professor of [place] of [facility] [employer]

## 2018-07-03 NOTE — Assessment & Plan Note (Signed)
8-12 beers daily. He is going to cut down to 3-4 beers daily, if insufficient improvement we will add Campral.

## 2018-07-03 NOTE — Assessment & Plan Note (Signed)
Starting Lamisil. Checking CMP.

## 2018-07-03 NOTE — Assessment & Plan Note (Addendum)
With peripheral neuropathy. A1cs cannot be done point-of-care due to his elevated hemoglobin levels. Continue metformin, starting Ozempic. He knows to only start the shot once we get his A1c levels in the lab. When he returns we will do his pneumococcal vaccine, referral placed for diabetic eye exam. Return to see me in 3 months.  A1c is 7.6%.  Not terrible but we can get it way down with the ozempic.  This will also help him to lose some weight and due to its side effect of early satiety possibly decrease the amount of beer he can stomach.

## 2018-07-03 NOTE — Assessment & Plan Note (Signed)
Metabolic syndrome and diabetes mellitus type 2. Ozempic will help him lose significant weight.

## 2018-07-04 LAB — LIPID PANEL W/REFLEX DIRECT LDL
Cholesterol: 157 mg/dL (ref <200)
HDL: 32 mg/dL — ABNORMAL LOW (ref >40)
LDL Cholesterol (Calc): 99 mg/dL (calc)
Non-HDL Cholesterol (Calc): 125 mg/dL (calc) (ref <130)
Total CHOL/HDL Ratio: 4.9 (calc) (ref <5.0)
Triglycerides: 165 mg/dL — ABNORMAL HIGH (ref <150)

## 2018-07-04 LAB — COMPREHENSIVE METABOLIC PANEL
AG Ratio: 1.3 (calc) (ref 1.0–2.5)
ALT: 47 U/L — ABNORMAL HIGH (ref 9–46)
AST: 31 U/L (ref 10–35)
Albumin: 4 g/dL (ref 3.6–5.1)
Alkaline phosphatase (APISO): 82 U/L (ref 40–115)
BUN/Creatinine Ratio: 22 (calc) (ref 6–22)
BUN: 34 mg/dL — ABNORMAL HIGH (ref 7–25)
CO2: 24 mmol/L (ref 20–32)
Calcium: 10.2 mg/dL (ref 8.6–10.3)
Chloride: 103 mmol/L (ref 98–110)
Globulin: 3.1 g/dL (calc) (ref 1.9–3.7)
Glucose, Bld: 271 mg/dL — ABNORMAL HIGH (ref 65–99)
Potassium: 4.4 mmol/L (ref 3.5–5.3)
Sodium: 139 mmol/L (ref 135–146)
Total Bilirubin: 0.9 mg/dL (ref 0.2–1.2)
Total Protein: 7.1 g/dL (ref 6.1–8.1)

## 2018-07-04 LAB — HEMOGLOBIN A1C
Hgb A1c MFr Bld: 7.6 % of total Hgb — ABNORMAL HIGH (ref <5.7)
Mean Plasma Glucose: 171 (calc)
eAG (mmol/L): 9.5 (calc)

## 2018-07-04 LAB — COMPREHENSIVE METABOLIC PANEL WITH GFR: Creat: 1.53 mg/dL — ABNORMAL HIGH (ref 0.70–1.33)

## 2018-07-04 LAB — CBC
HCT: 56.8 % — ABNORMAL HIGH (ref 38.5–50.0)
Hemoglobin: 19.2 g/dL — ABNORMAL HIGH (ref 13.2–17.1)
MCH: 29.5 pg (ref 27.0–33.0)
MCHC: 33.8 g/dL (ref 32.0–36.0)
MCV: 87.3 fL (ref 80.0–100.0)
MPV: 13.7 fL — ABNORMAL HIGH (ref 7.5–12.5)
Platelets: 234 10*3/uL (ref 140–400)
RBC: 6.51 Million/uL — ABNORMAL HIGH (ref 4.20–5.80)
RDW: 13.8 % (ref 11.0–15.0)
WBC: 9.4 Thousand/uL (ref 3.8–10.8)

## 2018-07-22 ENCOUNTER — Other Ambulatory Visit: Payer: Self-pay | Admitting: Sports Medicine

## 2018-08-18 ENCOUNTER — Other Ambulatory Visit: Payer: Self-pay | Admitting: Sports Medicine

## 2018-08-31 ENCOUNTER — Other Ambulatory Visit: Payer: Self-pay | Admitting: Sports Medicine

## 2018-08-31 DIAGNOSIS — N5201 Erectile dysfunction due to arterial insufficiency: Secondary | ICD-10-CM

## 2018-08-31 MED ORDER — SILDENAFIL CITRATE 20 MG PO TABS: ORAL_TABLET | ORAL | 1 refills | Status: DC

## 2018-09-06 ENCOUNTER — Other Ambulatory Visit: Payer: Self-pay | Admitting: Sports Medicine

## 2018-09-06 DIAGNOSIS — I10 Essential (primary) hypertension: Secondary | ICD-10-CM

## 2018-09-29 ENCOUNTER — Telehealth: Payer: Self-pay | Admitting: Sports Medicine

## 2018-09-29 NOTE — Telephone Encounter (Signed)
Pt called.. He has an appointment  for A1c chk on April 20th.Marland Kitchen

## 2018-09-29 NOTE — Telephone Encounter (Signed)
He is taking the Ozempic.

## 2018-09-29 NOTE — Telephone Encounter (Signed)
Happy to see him then, is he taking the Ozempic?

## 2018-10-05 ENCOUNTER — Other Ambulatory Visit: Payer: Self-pay

## 2018-10-05 ENCOUNTER — Ambulatory Visit (INDEPENDENT_AMBULATORY_CARE_PROVIDER_SITE_OTHER): Payer: BLUE CROSS/BLUE SHIELD | Admitting: Family Medicine

## 2018-10-05 ENCOUNTER — Ambulatory Visit (INDEPENDENT_AMBULATORY_CARE_PROVIDER_SITE_OTHER): Payer: BLUE CROSS/BLUE SHIELD

## 2018-10-05 ENCOUNTER — Encounter: Payer: Self-pay | Admitting: Family Medicine

## 2018-10-05 VITALS — BP 108/72 | HR 73 | Ht 77.0 in | Wt 255.0 lb

## 2018-10-05 DIAGNOSIS — M25522 Pain in left elbow: Secondary | ICD-10-CM

## 2018-10-05 DIAGNOSIS — I10 Essential (primary) hypertension: Secondary | ICD-10-CM

## 2018-10-05 DIAGNOSIS — I152 Hypertension secondary to endocrine disorders: Secondary | ICD-10-CM

## 2018-10-05 DIAGNOSIS — E1159 Type 2 diabetes mellitus with other circulatory complications: Secondary | ICD-10-CM

## 2018-10-05 DIAGNOSIS — E1149 Type 2 diabetes mellitus with other diabetic neurological complication: Secondary | ICD-10-CM | POA: Diagnosis not present

## 2018-10-05 LAB — POCT GLYCOSYLATED HEMOGLOBIN (HGB A1C): Hemoglobin A1C: 5.8 % — AB (ref 4.0–5.6)

## 2018-10-05 MED ORDER — AMBULATORY NON FORMULARY MEDICATION: 4 refills | Status: DC

## 2018-10-05 MED ORDER — SEMAGLUTIDE(0.25 OR 0.5MG/DOS) 2 MG/1.5ML ~~LOC~~ SOPN: 0.5000 mg | PEN_INJECTOR | SUBCUTANEOUS | 11 refills | Status: DC

## 2018-10-05 MED ORDER — DICLOFENAC SODIUM 1 % TD GEL: 2.0000 g | Freq: Four times a day (QID) | TRANSDERMAL | 11 refills | Status: DC

## 2018-10-05 NOTE — Patient Instructions (Addendum)
Thank you for coming in today. Continue current treatment for diabetes.  Recheck with Dr T in 3 months.   For the elbow pain apply the diclofenac gel for pain as needed up to 4x daily.  Do the negative with a lighter weight for biceps tendonitis.  If not improving let me know and we can consider physical therapy or even MRI.   Keep up updated.     Biceps Tendon Tendinitis (Distal) Rehab Ask your health care provider which exercises are safe for you. Do exercises exactly as told by your health care provider and adjust them as directed. It is normal to feel mild stretching, pulling, tightness, or discomfort as you do these exercises, but you should stop right away if you feel sudden pain or your pain gets worse.Do not begin these exercises until told by your health care provider. Stretching and range of motion exercises These exercises warm up your muscles and joints and improve the movement and flexibility of your arm. These exercises can also help to relieve pain and stiffness. Exercise A: Supination, active-assisted 1. Stand or sit with your left / right elbow bent to an "L" shape (90 degrees). 2. Rotate your palm up until you cannot rotate it anymore. Then, use your other hand to help rotate your left / right forearm more. 3. Hold this position for __________ seconds. 4. Slowly return to the starting position. Repeat __________ times. Complete this exercise __________ times a day. Exercise B: Pronation, active-assisted  1. Stand or sit with your left / right elbow bent to an "L" shape (90 degrees). 2. Rotate your left / right palm down until you cannot rotate it anymore. Then, use your other hand to help rotate your left / right forearm more. 3. Hold this position for __________ seconds. 4. Slowly return to the starting position. Repeat __________ times. Complete this exercise __________ times a day. Strengthening exercises These exercises build strength and endurance in your arm and  shoulder. Endurance is the ability to use your muscles for a long time, even after your muscles get tired. Exercise C: Supination  1. Sit with your left / right forearm supported on a table. Your elbow should be at waist height. 2. Rest your hand over the edge of the table, palm-down. 3. Gently grasp a lightweight hammer near the head. As this exercise gets easier for you, try holding the hammer farther down the handle. 4. Without moving your elbow, slowly rotate your palm up so it faces the ceiling. 5. Hold this position for__________ seconds. 6. Slowly return to the starting position. Repeat __________ times. Complete this exercise __________ times a day. Exercise D: Pronation  1. Sit with your left / right forearm supported on a table. Your elbow should be at waist height. 2. Rest your hand over the edge of the table, palm-up. 3. Gently grasp a lightweight hammer near the head. As this exercise gets easier for you, try holding the hammer farther down the handle. 4. Without moving your elbow, slowly rotate your palm down. 5. Hold this position for __________ seconds. 6. Slowly return to the starting position. Repeat __________ times. Complete this exercise __________ times a day. Exercise E: Elbow flexion, supinated  1. Sit on a stable chair without armrests, or stand. 2. If directed, hold a __________ weight in your left / right hand, or hold an exercise band with both hands. Your palms should face up toward the ceiling at the starting position. 3. Bend your left / right elbow  and move your hand up toward your shoulder. Keep your other arm straight down, in the starting position. 4. Slowly return to the starting position. Repeat __________ times. Complete this exercise __________ times a day. Exercise F: Elbow extension  1. Lie on your back. 2. Hold a __________ weight in your left / right hand. 3. Bend your left / right elbow to an "L" shape (90 degrees) so your elbow is pointed up  to the ceiling and the weight is overhead. 4. Straighten your elbow, raising your hand toward the ceiling. Use your other hand to support your left / right upper arm and to keep it still. 5. Slowly return to the starting position. Repeat __________ times. Complete this exercise __________ times a day. This information is not intended to replace advice given to you by your health care provider. Make sure you discuss any questions you have with your health care provider. Document Released: 06/10/2005 Document Revised: 02/15/2016 Document Reviewed: 05/19/2015 Elsevier Interactive Patient Education  2019 [employer].

## 2018-10-05 NOTE — Progress Notes (Signed)
Jonathan Oliver. is a 53 y.o. male who presents to [facility] today for diabetes follow-up and left elbow pain.  Mani takes metformin 500 mg twice daily as well as Ozempic 0.5 mg once per week for diabetes.  He notes his blood sugars are typically well controlled he is pretty happy with how things are going.  He notes he needs a blood sugar meter update and would like a prescription.  Additionally he thinks he needs an update on his Ozempic prescription.   Additionally he notes left elbow pain for the last 2 months.  He notes pain is worse when he is lifting weights especially doing curls.  He feels the pain at the anterior aspect of the elbow at both the lateral and medial aspect of the elbow.  He has the pain is not typical for lateral epicondylitis.  He cannot call any injury.  He denies any radiating pain weakness or numbness.   ROS as above:  Exam:  BP 108/72   Pulse 73   Ht 6\' 5"  (1.956 m)   Wt 255 lb (115.7 kg)   BMI 30.24 kg/m  Wt Readings from Last 5 Encounters:  10/05/18 255 lb (115.7 kg)  07/03/18 264 lb (119.7 kg)  06/29/18 269 lb (122 kg)  03/25/18 268 lb (121.6 kg)  09/02/17 260 lb (117.9 kg)    Gen: Well NAD HEENT: EOMI,  MMM Lungs: Normal work of breathing. CTABL Heart: RRR no MRG Abd: NABS, Soft. Nondistended, Nontender Exts: Brisk capillary refill, warm and well perfused.  Left elbow: Normal-appearing mildly tender to palpation at biceps insertion onto the proximal radius.  Not particularly tender at medial or lateral epicondyle. Normal elbow motion.  Normal strength.  No significant pain with resisted wrist extension or flexion. Pulses capillary fill and sensation are intact distally.  Lab and Radiology Results Results for orders placed or performed in visit on 10/05/18 (from the past 72 hour(s))  POCT HgB A1C     Status: Abnormal   Collection Time: 10/05/18  3:15 PM  Result Value Ref Range   Hemoglobin A1C 5.8 (A) 4.0 - 5.6 %   HbA1c POC (<> result, manual entry)     HbA1c, POC (prediabetic range)     HbA1c, POC (controlled diabetic range)     Dg Elbow Complete Left  Result Date: 10/05/2018 CLINICAL DATA:  Left elbow pain.  Pain for 2-3 months EXAM: LEFT ELBOW - COMPLETE 3+ VIEW COMPARISON:  None. FINDINGS: There is no evidence of fracture, dislocation, or joint effusion. There is no evidence of arthropathy or other focal bone abnormality. Soft tissues are unremarkable. IMPRESSION: Negative. Electronically Signed   By: Kathreen Devoid   On: 10/05/2018 15:50  I personally (independently) visualized and performed the interpretation of the images attached in this note.     Assessment and Plan: 53 y.o. male with  Diabetes: Well controlled.  Plan to continue current regimen.  New prescription for Ozempic and glucometer written today.  Recheck with PCP in 3 months.  Elbow pain: Likely biceps tendinitis.  Discussed options plan for home exercise program and diclofenac gel if not improving follow-up with PCP or myself.  Hypertension: Blood pressure extremely well controlled.  Talked about potentially weaning off of medications in the future.  PDMP not reviewed this encounter. Orders Placed This Encounter  Procedures  . DG Elbow Complete Left    Standing Status:   Future  Number of Occurrences:   1    Standing Expiration Date:   12/05/2019    Order Specific Question:   Reason for Exam (SYMPTOM  OR DIAGNOSIS REQUIRED)    Answer:   eval pain left elbow    Order Specific Question:   Preferred imaging location?    Answer:   [facility]    Order Specific Question:   Radiology Contrast Protocol - do NOT remove file path    Answer:   [email or web address]  . POCT HgB A1C   Meds ordered this encounter  Medications  . Semaglutide,0.25 or 0.5MG /DOS, (OZEMPIC, 0.25 OR 0.5  MG/DOSE,) 2 MG/1.5ML SOPN    Sig: Inject 0.5 mg into the skin once a week.    Dispense:  4 pen    Refill:  11  . AMBULATORY NON FORMULARY MEDICATION    Sig: Single glucometer with lancets, test strips Test daily.  Disp qs x 3 months. E11.49    Dispense:  1 each    Refill:  4  . diclofenac sodium (VOLTAREN) 1 % GEL    Sig: Apply 2 g topically 4 (four) times daily. To affected joint.    Dispense:  100 g    Refill:  11     Historical information moved to improve visibility of documentation.  Past Medical History:  Diagnosis Date  . Gout   . Headache   . Hypertension   . IFG (impaired fasting glucose)   . Kidney stones   . MRSA (methicillin resistant Staphylococcus aureus)    Past Surgical History:  Procedure Laterality Date  . ELBOW SURGERY Right    x 2  . sinus surgey    . uvuloplasty for OSA     Social History   Tobacco Use  . Smoking status: Never Smoker  . Smokeless tobacco: Never Used  Substance Use Topics  . Alcohol use: Yes    Alcohol/week: 42.0 standard drinks    Types: 42 Cans of beer per week    Comment: 6-8 beers a day   family history includes Cancer in his father and mother; Diabetes in his maternal aunt and maternal grandfather.  Medications: Current Outpatient Medications  Medication Sig Dispense Refill  . allopurinol (ZYLOPRIM) 300 MG tablet TAKE 1 TABLET BY MOUTH EVERY DAY 90 tablet 1  . amLODipine (NORVASC) 10 MG tablet TAKE 1 TABLET BY MOUTH EVERY DAY 90 tablet 1  . meloxicam (MOBIC) 15 MG tablet One tab PO qAM with breakfast for 2 weeks, then daily prn pain. 30 tablet 3  . metFORMIN (GLUCOPHAGE) 500 MG tablet TAKE 1 TABLET BY MOUTH 2 TIMES A DAY WITH MEALS 180 tablet 1  . metoprolol tartrate (LOPRESSOR) 100 MG tablet TAKE 2 TABLETS (200 MG TOTAL) BY MOUTH 2 (TWO) TIMES DAILY. 120 tablet 1  . Semaglutide,0.25 or 0.5MG /DOS, (OZEMPIC, 0.25 OR 0.5 MG/DOSE,) 2 MG/1.5ML SOPN Inject 0.5 mg into the skin once a week. 4 pen 11  . sildenafil (REVATIO)  20 MG tablet 2 to 5 tablets p.o. as needed for relations. 50 tablet 1  . testosterone cypionate (DEPOTESTOSTERONE CYPIONATE) 200 MG/ML injection INJECT 1 ML EVERY 14 DAYS 6 mL 1  . valsartan-hydrochlorothiazide (DIOVAN-HCT) 320-25 MG tablet TAKE 1 TABLET BY MOUTH EVERY DAY 90 tablet 1  . AMBULATORY NON FORMULARY MEDICATION Single glucometer with lancets, test strips Test daily.  Disp qs x 3 months. E11.49 1 each 4  . diclofenac sodium (VOLTAREN) 1 % GEL Apply 2 g topically 4 (four) times daily. To affected  joint. 100 g 11   No current facility-administered medications for this visit.    No Known Allergies   Discussed warning signs or symptoms. Please see discharge instructions. Patient expresses understanding.

## 2018-10-06 ENCOUNTER — Telehealth: Payer: Self-pay

## 2018-10-06 NOTE — Telephone Encounter (Signed)
Approved today (Diclofenac Gel) Your PA request has been approved. Additional information will be provided in the approval communication. (Message 1145) Pharmacy aware.

## 2018-10-06 NOTE — Telephone Encounter (Signed)
Covermymeds PA for Diclofenac Sodium 1% gel. Key [id number]

## 2018-10-06 NOTE — Telephone Encounter (Signed)
Information has been sent to insurance and I am waiting on a response.

## 2018-10-09 ENCOUNTER — Ambulatory Visit: Payer: BLUE CROSS/BLUE SHIELD | Admitting: Sports Medicine

## 2018-10-12 ENCOUNTER — Ambulatory Visit: Payer: BLUE CROSS/BLUE SHIELD | Admitting: Sports Medicine

## 2018-11-08 ENCOUNTER — Other Ambulatory Visit: Payer: Self-pay | Admitting: Sports Medicine

## 2018-11-08 DIAGNOSIS — N5201 Erectile dysfunction due to arterial insufficiency: Secondary | ICD-10-CM

## 2018-11-24 ENCOUNTER — Other Ambulatory Visit: Payer: Self-pay | Admitting: Sports Medicine

## 2018-11-24 DIAGNOSIS — M1000 Idiopathic gout, unspecified site: Secondary | ICD-10-CM

## 2018-11-24 DIAGNOSIS — I10 Essential (primary) hypertension: Secondary | ICD-10-CM

## 2018-12-08 ENCOUNTER — Other Ambulatory Visit: Payer: Self-pay | Admitting: Sports Medicine

## 2018-12-08 DIAGNOSIS — B351 Tinea unguium: Secondary | ICD-10-CM

## 2019-01-08 ENCOUNTER — Other Ambulatory Visit: Payer: Self-pay

## 2019-01-08 ENCOUNTER — Encounter: Payer: Self-pay | Admitting: Sports Medicine

## 2019-01-08 ENCOUNTER — Ambulatory Visit (INDEPENDENT_AMBULATORY_CARE_PROVIDER_SITE_OTHER): Payer: BC Managed Care – PPO | Admitting: Sports Medicine

## 2019-01-08 VITALS — BP 105/71 | HR 69 | Ht 77.0 in | Wt 248.0 lb

## 2019-01-08 DIAGNOSIS — Z0000 Encounter for general adult medical examination without abnormal findings: Secondary | ICD-10-CM

## 2019-01-08 DIAGNOSIS — I10 Essential (primary) hypertension: Secondary | ICD-10-CM | POA: Diagnosis not present

## 2019-01-08 DIAGNOSIS — E1159 Type 2 diabetes mellitus with other circulatory complications: Secondary | ICD-10-CM | POA: Diagnosis not present

## 2019-01-08 DIAGNOSIS — I152 Hypertension secondary to endocrine disorders: Secondary | ICD-10-CM

## 2019-01-08 DIAGNOSIS — E1149 Type 2 diabetes mellitus with other diabetic neurological complication: Secondary | ICD-10-CM

## 2019-01-08 DIAGNOSIS — Z23 Encounter for immunization: Secondary | ICD-10-CM | POA: Diagnosis not present

## 2019-01-08 DIAGNOSIS — Z1211 Encounter for screening for malignant neoplasm of colon: Secondary | ICD-10-CM

## 2019-01-08 LAB — POCT GLYCOSYLATED HEMOGLOBIN (HGB A1C): Hemoglobin A1C: 5.6 % (ref 4.0–5.6)

## 2019-01-08 MED ORDER — AMLODIPINE BESYLATE 5 MG PO TABS: 5.0000 mg | ORAL_TABLET | Freq: Every day | ORAL | 6 refills | Status: DC

## 2019-01-08 NOTE — Progress Notes (Signed)
Subjective:    CC: Follow-up  HPI: Diabetes mellitus type 2: Well-controlled.  Hypertension: Well-controlled, even running a bit low, no orthostatic symptoms.  Preventive measures: Due for Cologuard and pneumococcal 23.  I reviewed the past medical history, family history, social history, surgical history, and allergies today and no changes were needed.  Please see the problem list section below in epic for further details.  Past Medical History: Past Medical History:  Diagnosis Date  . Gout   . Headache   . Hypertension   . IFG (impaired fasting glucose)   . Kidney stones   . MRSA (methicillin resistant Staphylococcus aureus)    Past Surgical History: Past Surgical History:  Procedure Laterality Date  . ELBOW SURGERY Right    x 2  . sinus surgey    . uvuloplasty for OSA     Social History: Social History   Socioeconomic History  . Marital status: Married    Spouse name: Not on file  . Number of children: 3  . Years of education: HS  . Highest education level: Not on file  Occupational History  . Occupation: Maintenance  Social Needs  . Financial resource strain: Not on file  . Food insecurity    Worry: Not on file    Inability: Not on file  . Transportation needs    Medical: Not on file    Non-medical: Not on file  Tobacco Use  . Smoking status: Never Smoker  . Smokeless tobacco: Never Used  Substance and Sexual Activity  . Alcohol use: Yes    Alcohol/week: 42.0 standard drinks    Types: 42 Cans of beer per week    Comment: 6-8 beers a day  . Drug use: No  . Sexual activity: Not on file  Lifestyle  . Physical activity    Days per week: Not on file    Minutes per session: Not on file  . Stress: Not on file  Relationships  . Social Herbalist on phone: Not on file    Gets together: Not on file    Attends religious service: Not on file    Active member of club or organization: Not on file    Attends meetings of clubs or organizations:  Not on file    Relationship status: Not on file  Other Topics Concern  . Not on file  Social History Narrative   Lives at home with his spouse.   Right-handed.   1 can of [facility] per day.   Family History: Family History  Problem Relation Age of Onset  . Cancer Mother        Marena Chancy - thinks it started in her lungs.  . Cancer Father        Unsure of type.  . Diabetes Maternal Grandfather   . Diabetes Maternal Aunt   . Hypertension Neg Hx    Allergies: No Known Allergies Medications: See med rec.  Review of Systems: No fevers, chills, night sweats, weight loss, chest pain, or shortness of breath.   Objective:    General: Well Developed, well nourished, and in no acute distress.  Neuro: Alert and oriented x3, extra-ocular muscles intact, sensation grossly intact.  HEENT: Normocephalic, atraumatic, pupils equal round reactive to light, neck supple, no masses, no lymphadenopathy, thyroid nonpalpable.  Skin: Warm and dry, no rashes. Cardiac: Regular rate and rhythm, no murmurs rubs or gallops, no lower extremity edema.  Respiratory: Clear to auscultation bilaterally. Not using accessory muscles, speaking in  full sentences.  Impression and Recommendations:    Hypertension associated with diabetes ([place]) Blood pressures running a bit low, asymptomatic, decreasing amlodipine to 5 mg, continue metoprolol, as well as valsartan/HCTZ. Return in 2 weeks for nurse visit blood pressure check.  Type 2 diabetes mellitus with neurological complications ([facility]) Excellent weight loss over the past several months with Ozempic, continue 0.5 mg. Pneumococcal vaccine today.   Annual physical exam Adding Cologuard testing, pneumococcal 23.  I spent 25 minutes with this patient, greater than 50% was face-to-face time counseling regarding the above diagnoses.  ___________________________________________ Gwen Her. Dianah Field, M.D., ABFM., CAQSM. Primary Care and Sports Medicine [facility]  Adjunct Professor of [place] [employer] [facility] of Medicine

## 2019-01-08 NOTE — Assessment & Plan Note (Signed)
Excellent weight loss over the past several months with Ozempic, continue 0.5 mg. Pneumococcal vaccine today.

## 2019-01-08 NOTE — Assessment & Plan Note (Signed)
Blood pressures running a bit low, asymptomatic, decreasing amlodipine to 5 mg, continue metoprolol, as well as valsartan/HCTZ. Return in 2 weeks for nurse visit blood pressure check.

## 2019-01-08 NOTE — Assessment & Plan Note (Signed)
Adding Cologuard testing, pneumococcal 23.

## 2019-01-22 ENCOUNTER — Encounter: Payer: Self-pay | Admitting: Family Medicine

## 2019-01-22 ENCOUNTER — Other Ambulatory Visit: Payer: Self-pay

## 2019-01-22 ENCOUNTER — Ambulatory Visit (INDEPENDENT_AMBULATORY_CARE_PROVIDER_SITE_OTHER): Payer: BC Managed Care – PPO | Admitting: Family Medicine

## 2019-01-22 VITALS — BP 119/70 | HR 75 | Wt 248.0 lb

## 2019-01-22 DIAGNOSIS — I10 Essential (primary) hypertension: Secondary | ICD-10-CM | POA: Diagnosis not present

## 2019-01-22 NOTE — Patient Instructions (Signed)
2

## 2019-01-22 NOTE — Progress Notes (Signed)
  Subjective:     Patient ID: Jonathan Oliver., male   DOB: [birth year], 53 y.o.   MRN: [id number]  HPI Patient presents to office for blood pressure check. Patient has no complaints of fatigue, SOB, dizziness, chest pain or headache. Taking meds as prescribed.  Review of Systems     Objective:   Physical Exam     Assessment:    HTN    Plan:      Return in 2 weeks for nurse BP check

## 2019-01-22 NOTE — Progress Notes (Signed)
BPs were running low when came in 2 wks ago for routine check.  His amlodipine was decreased to 5 mg and he was asked to return today.  Blood pressure looks perfect so plan to continue with 5 mg of amlodipine daily and keep regularly scheduled follow-up in 6 months with PCP.  Beatrice Lecher, MD

## 2019-01-26 LAB — COLOGUARD

## 2019-01-27 ENCOUNTER — Encounter: Payer: Self-pay | Admitting: Gastroenterology

## 2019-01-27 ENCOUNTER — Other Ambulatory Visit: Payer: Self-pay | Admitting: Sports Medicine

## 2019-01-27 DIAGNOSIS — R195 Other fecal abnormalities: Secondary | ICD-10-CM

## 2019-01-27 HISTORY — DX: Other fecal abnormalities: R19.5

## 2019-01-27 NOTE — Assessment & Plan Note (Signed)
Referral for colonoscopy °

## 2019-02-05 ENCOUNTER — Ambulatory Visit (INDEPENDENT_AMBULATORY_CARE_PROVIDER_SITE_OTHER): Payer: BC Managed Care – PPO | Admitting: Sports Medicine

## 2019-02-05 ENCOUNTER — Other Ambulatory Visit: Payer: Self-pay

## 2019-02-05 VITALS — BP 123/80 | HR 70 | Ht 77.0 in | Wt 248.0 lb

## 2019-02-05 DIAGNOSIS — I10 Essential (primary) hypertension: Secondary | ICD-10-CM

## 2019-02-05 DIAGNOSIS — I152 Hypertension secondary to endocrine disorders: Secondary | ICD-10-CM

## 2019-02-05 DIAGNOSIS — E1159 Type 2 diabetes mellitus with other circulatory complications: Secondary | ICD-10-CM

## 2019-02-05 NOTE — Progress Notes (Signed)
Patient presents to clinic for BP check. Patient reports that he is taking all of his BP medications (Amlodipine 5 mg, Lopressor 100 mg, and Diovan-HCT 320-25 mg). He denies any complications with the medications. Patient did not need any refills of his medication.

## 2019-02-16 ENCOUNTER — Other Ambulatory Visit: Payer: Self-pay | Admitting: Sports Medicine

## 2019-02-16 DIAGNOSIS — N5201 Erectile dysfunction due to arterial insufficiency: Secondary | ICD-10-CM

## 2019-02-16 DIAGNOSIS — E349 Endocrine disorder, unspecified: Secondary | ICD-10-CM

## 2019-02-16 MED ORDER — TESTOSTERONE CYPIONATE 200 MG/ML IM SOLN: 200.0000 mg | INTRAMUSCULAR | 1 refills | Status: DC

## 2019-02-16 MED ORDER — SILDENAFIL CITRATE 20 MG PO TABS: ORAL_TABLET | ORAL | 1 refills | Status: DC

## 2019-02-25 ENCOUNTER — Encounter: Payer: Self-pay | Admitting: *Deleted

## 2019-02-25 ENCOUNTER — Ambulatory Visit (AMBULATORY_SURGERY_CENTER): Payer: Self-pay | Admitting: *Deleted

## 2019-02-25 ENCOUNTER — Encounter: Payer: Self-pay | Admitting: Gastroenterology

## 2019-02-25 ENCOUNTER — Other Ambulatory Visit: Payer: Self-pay

## 2019-02-25 VITALS — Temp 97.5°F | Ht 77.0 in | Wt 247.0 lb

## 2019-02-25 DIAGNOSIS — R195 Other fecal abnormalities: Secondary | ICD-10-CM

## 2019-02-25 MED ORDER — SUPREP BOWEL PREP KIT 17.5-3.13-1.6 GM/177ML PO SOLN: 1.0000 | Freq: Once | ORAL | 0 refills | Status: AC

## 2019-02-25 NOTE — Progress Notes (Signed)
No egg or soy allergy known to patient  No issues with past sedation with any surgeries  or procedures, no intubation problems  No diet pills per patient No home 02 use per patient  No blood thinners per patient  Pt states  issues with constipation - lately has had issues with constipation- stools are hard- no bleeding- not using any OTC meds to help with constipation - will do a 2 day Suprep  No A fib or A flutter  EMMI video sent to pt's e mail   Suprep $15 coupon to pt

## 2019-03-08 ENCOUNTER — Other Ambulatory Visit: Payer: Self-pay

## 2019-03-08 ENCOUNTER — Encounter: Payer: Self-pay | Admitting: Gastroenterology

## 2019-03-08 ENCOUNTER — Ambulatory Visit (AMBULATORY_SURGERY_CENTER): Payer: BC Managed Care – PPO | Admitting: Gastroenterology

## 2019-03-08 VITALS — BP 120/76 | HR 73 | Temp 98.1°F | Resp 11 | Ht 77.0 in | Wt 247.0 lb

## 2019-03-08 DIAGNOSIS — D125 Benign neoplasm of sigmoid colon: Secondary | ICD-10-CM

## 2019-03-08 DIAGNOSIS — K529 Noninfective gastroenteritis and colitis, unspecified: Secondary | ICD-10-CM | POA: Diagnosis not present

## 2019-03-08 DIAGNOSIS — R195 Other fecal abnormalities: Secondary | ICD-10-CM | POA: Diagnosis not present

## 2019-03-08 DIAGNOSIS — K635 Polyp of colon: Secondary | ICD-10-CM | POA: Diagnosis not present

## 2019-03-08 MED ORDER — SODIUM CHLORIDE 0.9 % IV SOLN: 500.0000 mL | Freq: Once | INTRAVENOUS | Status: DC

## 2019-03-08 NOTE — Progress Notes (Signed)
No problems noted in the recovery room. Maw  Clip card was given to pt and instructed to place in his wallet and if needs to have a MRI , pt needs to show them clip card. Pt states he understands. maw

## 2019-03-08 NOTE — Progress Notes (Signed)
Temp check by JB/Vital check by CW.  No changes in medical history since pre-visit.

## 2019-03-08 NOTE — Progress Notes (Signed)
Report to PACU, RN, vss, BBS= Clear.  

## 2019-03-08 NOTE — Progress Notes (Signed)
Called to room to assist during endoscopic procedure.  Patient ID and intended procedure confirmed with present staff. Received instructions for my participation in the procedure from the performing physician.  

## 2019-03-08 NOTE — Patient Instructions (Signed)
YOU HAD AN ENDOSCOPIC PROCEDURE TODAY AT [facility]:   Refer to the procedure report that was given to you for any specific questions about what was found during the examination.  If the procedure report does not answer your questions, please call your gastroenterologist to clarify.  If you requested that your care partner not be given the details of your procedure findings, then the procedure report has been included in a sealed envelope for you to review at your convenience later.  YOU SHOULD EXPECT: Some feelings of bloating in the abdomen. Passage of more gas than usual.  Walking can help get rid of the air that was put into your GI tract during the procedure and reduce the bloating. If you had a lower endoscopy (such as a colonoscopy or flexible sigmoidoscopy) you may notice spotting of blood in your stool or on the toilet paper. If you underwent a bowel prep for your procedure, you may not have a normal bowel movement for a few days.  Please Note:  You might notice some irritation and congestion in your nose or some drainage.  This is from the oxygen used during your procedure.  There is no need for concern and it should clear up in a day or so.  SYMPTOMS TO REPORT IMMEDIATELY:   Following lower endoscopy (colonoscopy or flexible sigmoidoscopy):  Excessive amounts of blood in the stool  Significant tenderness or worsening of abdominal pains  Swelling of the abdomen that is new, acute  Fever of 100F or higher   For urgent or emergent issues, a gastroenterologist can be reached at any hour by calling [phone].   DIET:  We do recommend a small meal at first, but then you may proceed to your regular diet.  Drink plenty of fluids but you should avoid alcoholic beverages for 24 hours.  ACTIVITY:  You should plan to take it easy for the rest of today and you should NOT DRIVE or use heavy machinery until tomorrow (because of the sedation medicines used during the test).     FOLLOW UP: Our staff will call the number listed on your records 48-72 hours following your procedure to check on you and address any questions or concerns that you may have regarding the information given to you following your procedure. If we do not reach you, we will leave a message.  We will attempt to reach you two times.  During this call, we will ask if you have developed any symptoms of COVID 19. If you develop any symptoms (ie: fever, flu-like symptoms, shortness of breath, cough etc.) before then, please call [phone].  If you test positive for Covid 19 in the 2 weeks post procedure, please call and report this information to [place].    If any biopsies were taken you will be contacted by phone or by letter within the next 1-3 weeks.  Please call us at [phone] if you have not heard about the biopsies in 3 weeks.    SIGNATURES/CONFIDENTIALITY: You and/or your care partner have signed paperwork which will be entered into your electronic medical record.  These signatures attest to the fact that that the information above on your After Visit Summary has been reviewed and is understood.  Full responsibility of the confidentiality of this discharge information lies with you and/or your care-partner.    Handout was given to your on polyps. Your blood sugar was 102 in the recovery room. You may resume your current medications today. Await  biopsy results. Please call if any questions or concerns.

## 2019-03-08 NOTE — Op Note (Signed)
[facility] Patient Name: Jonathan Oliver Procedure Date: 03/08/2019 9:37 AM MRN: [id number] Endoscopist: Gerrit Heck , MD Age: 53 Referring MD:  Date of Birth: 1965/12/05 Gender: Male Account #: [id number] Procedure:                Colonoscopy Indications:              Positive Cologuard test. Otherwise, no overt GI                            blood loss, change in bowel habits and no family                            history of colon cancer. Medicines:                Monitored Anesthesia Care Procedure:                Pre-Anesthesia Assessment:                           - Prior to the procedure, a History and Physical                            was performed, and patient medications and                            allergies were reviewed. The patient's tolerance of                            previous anesthesia was also reviewed. The risks                            and benefits of the procedure and the sedation                            options and risks were discussed with the patient.                            All questions were answered, and informed consent                            was obtained. Prior Anticoagulants: The patient has                            taken no previous anticoagulant or antiplatelet                            agents. ASA Grade Assessment: II - A patient with                            mild systemic disease. After reviewing the risks                            and benefits, the patient was deemed in  satisfactory condition to undergo the procedure.                           After obtaining informed consent, the colonoscope                            was passed under direct vision. Throughout the                            procedure, the patient's blood pressure, pulse, and                            oxygen saturations were monitored continuously. The                            Colonoscope was introduced through the  anus and                            advanced to the the cecum, identified by                            appendiceal orifice and ileocecal valve. The                            colonoscopy was performed without difficulty. The                            patient tolerated the procedure well. The quality                            of the bowel preparation was adequate. The                            ileocecal valve, appendiceal orifice, and rectum                            were photographed. Scope In: 9:43:07 AM Scope Out: 10:07:48 AM Scope Withdrawal Time: 0 hours 20 minutes 37 seconds  Total Procedure Duration: 0 hours 24 minutes 41 seconds  Findings:                 The perianal and digital rectal examinations were                            normal.                           A scattered areas of mildly erythematous mucosa was                            found in the sigmoid colon, in the transverse                            colon, and in the ascending colon. There were no  associated ulcers or mucosal erosions. Biopsies                            were taken with a cold forceps for histology.                            Estimated blood loss was minimal.                           A localized area of nodular mucosa was found in the                            descending colon. Biopsies were taken with a cold                            forceps for histology. Estimated blood loss was                            minimal.                           A 6 mm polyp was found in the proximal sigmoid                            colon, located 90 cm from the anal verge. The polyp                            was sessile. The polyp was removed with a cold                            snare. Resection and retrieval were complete. For                            hemostasis, one hemostatic clip was successfully                            placed (MR conditional). There was no bleeding at                             the end of the procedure.                           The retroflexed view of the distal rectum and anal                            verge was normal and showed no anal or rectal                            abnormalities. Complications:            No immediate complications. Estimated Blood Loss:     Estimated blood loss was minimal. Impression:               - Scattered areas of mildly erythematous mucosa in  the sigmoid colon, in the transverse colon and in                            the ascending colon. Biopsied.                           - Nodular mucosa in the descending colon. Biopsied.                           - One 6 mm polyp in the sigmoid colon, removed with                            a cold snare. Resected and retrieved. Clip (MR                            conditional) was placed.                           - The distal rectum and anal verge are normal on                            retroflexion view. Recommendation:           - Patient has a contact number available for                            emergencies. The signs and symptoms of potential                            delayed complications were discussed with the                            patient. Return to normal activities tomorrow.                            Written discharge instructions were provided to the                            patient.                           - Resume previous diet.                           - Continue present medications.                           - Await pathology results.                           - Repeat colonoscopy for surveillance based on                            pathology results.                           -  Return to GI office PRN. Gerrit Heck, MD 03/08/2019 10:15:42 AM

## 2019-03-10 ENCOUNTER — Telehealth: Payer: Self-pay

## 2019-03-10 NOTE — Telephone Encounter (Signed)
  Follow up Call-  Call back number 03/08/2019  Post procedure Call Back phone  # [phone]  Permission to leave phone message Yes  Some recent data might be hidden     Patient questions:  Do you have a fever, pain , or abdominal swelling? No. Pain Score  0 *  Have you tolerated food without any problems? Yes.    Have you been able to return to your normal activities? Yes.    Do you have any questions about your discharge instructions: Diet   No. Medications  No. Follow up visit  No.  Do you have questions or concerns about your Care? No.  Actions: * If pain score is 4 or above: 1. No action needed, pain <4.Have you developed a fever since your procedure? no  2.   Have you had an respiratory symptoms (SOB or cough) since your procedure? no  3.   Have you tested positive for COVID 19 since your procedure no  4.   Have you had any family members/close contacts diagnosed with the COVID 19 since your procedure?  no   If yes to any of these questions please route to Joylene John, RN and Alphonsa Gin, Therapist, sports.

## 2019-03-11 ENCOUNTER — Other Ambulatory Visit: Payer: Self-pay | Admitting: *Deleted

## 2019-03-11 DIAGNOSIS — I10 Essential (primary) hypertension: Secondary | ICD-10-CM

## 2019-03-11 MED ORDER — VALSARTAN-HYDROCHLOROTHIAZIDE 320-25 MG PO TABS: 1.0000 | ORAL_TABLET | Freq: Every day | ORAL | 1 refills | Status: DC

## 2019-03-15 ENCOUNTER — Other Ambulatory Visit: Payer: Self-pay | Admitting: Sports Medicine

## 2019-03-15 ENCOUNTER — Encounter: Payer: Self-pay | Admitting: Gastroenterology

## 2019-03-15 DIAGNOSIS — I10 Essential (primary) hypertension: Secondary | ICD-10-CM

## 2019-03-15 MED ORDER — METOPROLOL TARTRATE 100 MG PO TABS: 200.0000 mg | ORAL_TABLET | Freq: Two times a day (BID) | ORAL | 3 refills | Status: DC

## 2019-04-16 ENCOUNTER — Ambulatory Visit: Payer: BC Managed Care – PPO | Admitting: Gastroenterology

## 2019-04-16 ENCOUNTER — Encounter: Payer: Self-pay | Admitting: Gastroenterology

## 2019-04-16 ENCOUNTER — Other Ambulatory Visit: Payer: Self-pay

## 2019-04-16 VITALS — BP 116/80 | HR 85 | Temp 98.4°F | Ht 77.0 in | Wt 243.2 lb

## 2019-04-16 DIAGNOSIS — K5140 Inflammatory polyps of colon without complications: Secondary | ICD-10-CM | POA: Diagnosis not present

## 2019-04-16 DIAGNOSIS — K529 Noninfective gastroenteritis and colitis, unspecified: Secondary | ICD-10-CM

## 2019-04-16 NOTE — Progress Notes (Signed)
P  Chief Complaint:    Procedure follow-up  GI History: 53 year old male with index colonoscopy in 02/2019 following positive Cologuard notable for scattered areas of mildly erythematous mucosa with biopsies demonstrating chronic and active inflammatory changes along with a 6 mm inflammatory polyp in the sigmoid colon.  Otherwise without any GI symptoms.  No personal or family history of IBD.  Endoscopic history: -Colonoscopy (02/2019, Dr. Bryan Lemma, done for positive Cologuard): Scattered areas of mildly erythematous mucosa (biopsies: Chronic inflammatory changes with active colitis), 6 mm inflammatory polyp in the sigmoid  HPI:     Patient is a 53 y.o. male presenting to the Gastroenterology Clinic for follow-up.  Colonoscopy completed last month as outlined above.  He is otherwise without any GI symptoms.  States he is in his usual state of health and without any complaints today.  Review of systems:     No chest pain, no SOB, no fevers, no urinary sx   Past Medical History:  Diagnosis Date  . Allergy   . Arthritis    gout  . Diabetes mellitus without complication ([place])   . Gout   . Headache   . Hypertension   . IFG (impaired fasting glucose)   . Kidney stones   . MRSA (methicillin resistant Staphylococcus aureus)     Patient's surgical history, family medical history, social history, medications and allergies were all reviewed in Epic    Current Outpatient Medications  Medication Sig Dispense Refill  . allopurinol (ZYLOPRIM) 300 MG tablet TAKE 1 TABLET BY MOUTH EVERY DAY 90 tablet 1  . AMBULATORY NON FORMULARY MEDICATION Single glucometer with lancets, test strips Test daily.  Disp qs x 3 months. E11.49 1 each 4  . amLODipine (NORVASC) 5 MG tablet Take 1 tablet (5 mg total) by mouth daily. 30 tablet 6  . metFORMIN (GLUCOPHAGE) 500 MG tablet TAKE 1 TABLET BY MOUTH TWICE A DAY WITH MEALS 180 tablet 1  . metoprolol tartrate (LOPRESSOR) 100 MG tablet Take 2 tablets (200  mg total) by mouth 2 (two) times daily. 120 tablet 3  . Semaglutide,0.25 or 0.5MG /DOS, (OZEMPIC, 0.25 OR 0.5 MG/DOSE,) 2 MG/1.5ML SOPN Inject 0.5 mg into the skin once a week. (Patient taking differently: Inject 0.5 mg into the skin once a week. Takes on Sunday) 4 pen 11  . sildenafil (REVATIO) 20 MG tablet TAKE 2-5 TABLETS AS NEEDED RELATIONS 50 tablet 1  . terbinafine (LAMISIL) 250 MG tablet TAKE 1 TABLET BY MOUTH EVERY DAY 90 tablet 1  . testosterone cypionate (DEPOTESTOSTERONE CYPIONATE) 200 MG/ML injection Inject 1 mL (200 mg total) into the muscle every 14 (fourteen) days. 6 mL 1  . valsartan-hydrochlorothiazide (DIOVAN-HCT) 320-25 MG tablet Take 1 tablet by mouth daily. 90 tablet 1   No current facility-administered medications for this visit.     Physical Exam:     BP 116/80   Pulse 85   Temp 98.4 F (36.9 C)   Ht 6\' 5"  (1.956 m)   Wt 243 lb 4 oz (110.3 kg)   BMI 28.85 kg/m   GENERAL:  Pleasant male in NAD PSYCH: : Cooperative, normal affect EENT:  conjunctiva pink, mucous membranes moist, neck supple without masses CARDIAC:  RRR, no murmur heard, no peripheral edema PULM: Normal respiratory effort, lungs CTA bilaterally, no wheezing ABDOMEN:  Nondistended, soft, nontender. No obvious masses, no hepatomegaly,  normal bowel sounds SKIN:  turgor, no lesions seen Musculoskeletal:  Normal muscle tone, normal strength NEURO: Alert and oriented x 3, no  focal neurologic deficits   IMPRESSION and PLAN:    1) Inflammatory polyp 2) Chronic colitis on colonoscopy     53 year old male who interestingly had mild inflammatory changes on his initial colonoscopy (done for positive Cologuard) with biopsies demonstrating both acute and chronic inflammatory changes.  He is otherwise without any GI symptoms.  Discussed endoscopic/histologic findings at length today.  While this could demonstrate early and/or quiesced sent IBD, given the lack of symptoms, no plan to start IBD therapy.   Discussed CRC risk in those with IBD versus general population.  He only had a single inflammatory polyp, otherwise no dysplastic changes.  -Repeat colonoscopy in 7 years -No plan to initiate IBD therapy at this time -To call me if he experiences any GI symptoms or concerns -RTC prn     Lavena Bullion ,DO, FACG 04/16/2019, 11:40 AM

## 2019-04-16 NOTE — Patient Instructions (Signed)
You will be due for another colonoscopy in 7 years.  You will receive a letter reminding you when it is time to call and schedule this.  Please follow up as needed

## 2019-05-06 ENCOUNTER — Other Ambulatory Visit: Payer: Self-pay | Admitting: Sports Medicine

## 2019-05-06 DIAGNOSIS — E1159 Type 2 diabetes mellitus with other circulatory complications: Secondary | ICD-10-CM

## 2019-05-06 DIAGNOSIS — I10 Essential (primary) hypertension: Secondary | ICD-10-CM

## 2019-05-06 DIAGNOSIS — I152 Hypertension secondary to endocrine disorders: Secondary | ICD-10-CM

## 2019-05-07 ENCOUNTER — Ambulatory Visit (INDEPENDENT_AMBULATORY_CARE_PROVIDER_SITE_OTHER): Payer: BC Managed Care – PPO

## 2019-05-07 ENCOUNTER — Ambulatory Visit (INDEPENDENT_AMBULATORY_CARE_PROVIDER_SITE_OTHER): Payer: BC Managed Care – PPO | Admitting: Sports Medicine

## 2019-05-07 ENCOUNTER — Other Ambulatory Visit: Payer: Self-pay

## 2019-05-07 ENCOUNTER — Encounter: Payer: Self-pay | Admitting: Sports Medicine

## 2019-05-07 DIAGNOSIS — B070 Plantar wart: Secondary | ICD-10-CM

## 2019-05-07 DIAGNOSIS — S2232XA Fracture of one rib, left side, initial encounter for closed fracture: Secondary | ICD-10-CM | POA: Insufficient documentation

## 2019-05-07 DIAGNOSIS — M722 Plantar fascial fibromatosis: Secondary | ICD-10-CM

## 2019-05-07 DIAGNOSIS — S2242XS Multiple fractures of ribs, left side, sequela: Secondary | ICD-10-CM

## 2019-05-07 DIAGNOSIS — S299XXA Unspecified injury of thorax, initial encounter: Secondary | ICD-10-CM

## 2019-05-07 HISTORY — DX: Plantar wart: B07.0

## 2019-05-07 HISTORY — DX: Plantar fascial fibromatosis: M72.2

## 2019-05-07 NOTE — Assessment & Plan Note (Addendum)
CT of the chest, x-rays were negative, persistent discomfort 6 weeks post trauma.  CT does confirm multiple fractures through the left fourth through seventh ribs, subacute and healing. Ultimately we can expect 12 total weeks of pain.

## 2019-05-07 NOTE — Assessment & Plan Note (Signed)
Gel cups, rehab exercises, return in 2 to 4 weeks for this.

## 2019-05-07 NOTE — Assessment & Plan Note (Addendum)
We shaved down. Return in 2 weeks for this.

## 2019-05-07 NOTE — Progress Notes (Addendum)
Subjective:    CC: Several issues  HPI: Chest wall pain: Fell in the shower about 6 weeks ago, went to the ED, x-rays did not show any evidence of a fracture but it sounds as though the provider told him that his ribs were cracked but not broken.  Continues to have pain when rolling over at night, localized at the anterior axillary line at about the level of the nipple.  No shortness of breath, no hemoptysis.  In addition he has pain on the plantar aspect of his right foot, he thinks he has a plantar wart.  I reviewed the past medical history, family history, social history, surgical history, and allergies today and no changes were needed.  Please see the problem list section below in epic for further details.  Past Medical History: Past Medical History:  Diagnosis Date  . Allergy   . Arthritis    gout  . Diabetes mellitus without complication ([place])   . Gout   . Headache   . Hypertension   . IFG (impaired fasting glucose)   . Kidney stones   . MRSA (methicillin resistant Staphylococcus aureus)    Past Surgical History: Past Surgical History:  Procedure Laterality Date  . COLONOSCOPY     20 yrs ago [place] normal per pt   . ELBOW SURGERY Right    x 2  . sinus surgey    . tumor removed side of face     age 67 or 37   . uvuloplasty for OSA     Social History: Social History   Socioeconomic History  . Marital status: Married    Spouse name: Not on file  . Number of children: 3  . Years of education: HS  . Highest education level: Not on file  Occupational History  . Occupation: Maintenance  Social Needs  . Financial resource strain: Not on file  . Food insecurity    Worry: Not on file    Inability: Not on file  . Transportation needs    Medical: Not on file    Non-medical: Not on file  Tobacco Use  . Smoking status: Never Smoker  . Smokeless tobacco: Never Used  Substance and Sexual Activity  . Alcohol use: Yes    Alcohol/week: 42.0 standard drinks   Types: 42 Cans of beer per week    Comment: 6-8 beers a day  . Drug use: No  . Sexual activity: Not on file  Lifestyle  . Physical activity    Days per week: Not on file    Minutes per session: Not on file  . Stress: Not on file  Relationships  . Social Herbalist on phone: Not on file    Gets together: Not on file    Attends religious service: Not on file    Active member of club or organization: Not on file    Attends meetings of clubs or organizations: Not on file    Relationship status: Not on file  Other Topics Concern  . Not on file  Social History Narrative   Lives at home with his spouse.   Right-handed.   1 can of [facility] per day.   Family History: Family History  Problem Relation Age of Onset  . Cancer Mother        Marena Chancy - thinks it started in her lungs.  . Cancer Father        Unsure of type.  . Diabetes Maternal Grandfather   .  Diabetes Maternal Aunt   . Hypertension Neg Hx   . Colon cancer Neg Hx   . Colon polyps Neg Hx   . Esophageal cancer Neg Hx   . Rectal cancer Neg Hx   . Stomach cancer Neg Hx    Allergies: No Known Allergies Medications: See med rec.  Review of Systems: No fevers, chills, night sweats, weight loss, chest pain, or shortness of breath.   Objective:    General: Well Developed, well nourished, and in no acute distress.  Neuro: Alert and oriented x3, extra-ocular muscles intact, sensation grossly intact.  HEENT: Normocephalic, atraumatic, pupils equal round reactive to light, neck supple, no masses, no lymphadenopathy, thyroid nonpalpable.  Skin: Warm and dry, no rashes. Cardiac: Regular rate and rhythm, no murmurs rubs or gallops, no lower extremity edema.  Respiratory: Clear to auscultation bilaterally. Not using accessory muscles, speaking in full sentences. Chest wall: No discrete areas of tenderness to palpation. Right foot: There is a small plantar wart. Range of motion is full in all directions. Strength  is 5/5 in all directions. No hallux valgus. No pes cavus or pes planus. No abnormal callus noted. No pain over the navicular prominence, or base of fifth metatarsal. Moderate tenderness to palpation of the calcaneal insertion of plantar fascia. No pain at the Achilles insertion. No pain over the calcaneal bursa. No pain of the retrocalcaneal bursa. No tenderness to palpation over the tarsals, metatarsals, or phalanges. No hallux rigidus or limitus. No tenderness palpation over interphalangeal joints. No pain with compression of the metatarsal heads. Neurovascularly intact distally.  Procedure: Shave of right foot plantar wart Risks, benefits, and alternatives explained and consent obtained. Time out conducted. Surface prepped with alcohol. Excision performed with: DermaBlade Blood loss: 0 Pt stable.  CT does confirm multiple fractures through the left fourth through seventh ribs, subacute and healing.  Impression and Recommendations:    Left rib fracture CT of the chest, x-rays were negative, persistent discomfort 6 weeks post trauma.  CT does confirm multiple fractures through the left fourth through seventh ribs, subacute and healing. Ultimately we can expect 12 total weeks of pain.  Plantar wart of right foot We shaved down. Return in 2 weeks for this.  Plantar fasciitis, right Gel cups, rehab exercises, return in 2 to 4 weeks for this.   ___________________________________________ Gwen Her. Dianah Field, M.D., ABFM., CAQSM. Primary Care and Sports Medicine [facility]  Adjunct Professor of [place] [employer] [facility] [facility] [employer]

## 2019-05-28 ENCOUNTER — Ambulatory Visit: Payer: BC Managed Care – PPO | Admitting: Sports Medicine

## 2019-06-13 ENCOUNTER — Other Ambulatory Visit: Payer: Self-pay | Admitting: Sports Medicine

## 2019-06-13 DIAGNOSIS — I10 Essential (primary) hypertension: Secondary | ICD-10-CM

## 2019-06-15 ENCOUNTER — Other Ambulatory Visit: Payer: Self-pay | Admitting: *Deleted

## 2019-06-15 DIAGNOSIS — M1000 Idiopathic gout, unspecified site: Secondary | ICD-10-CM

## 2019-06-15 MED ORDER — ALLOPURINOL 300 MG PO TABS: 300.0000 mg | ORAL_TABLET | Freq: Every day | ORAL | 1 refills | Status: DC

## 2019-07-07 ENCOUNTER — Other Ambulatory Visit: Payer: Self-pay | Admitting: Sports Medicine

## 2019-07-07 DIAGNOSIS — N5201 Erectile dysfunction due to arterial insufficiency: Secondary | ICD-10-CM

## 2019-07-16 ENCOUNTER — Ambulatory Visit (INDEPENDENT_AMBULATORY_CARE_PROVIDER_SITE_OTHER): Payer: BC Managed Care – PPO | Admitting: Sports Medicine

## 2019-07-16 ENCOUNTER — Encounter: Payer: Self-pay | Admitting: Sports Medicine

## 2019-07-16 ENCOUNTER — Other Ambulatory Visit: Payer: Self-pay

## 2019-07-16 VITALS — BP 117/79 | HR 73 | Ht 77.0 in | Wt 248.0 lb

## 2019-07-16 DIAGNOSIS — E349 Endocrine disorder, unspecified: Secondary | ICD-10-CM | POA: Diagnosis not present

## 2019-07-16 DIAGNOSIS — N5201 Erectile dysfunction due to arterial insufficiency: Secondary | ICD-10-CM

## 2019-07-16 DIAGNOSIS — E1159 Type 2 diabetes mellitus with other circulatory complications: Secondary | ICD-10-CM | POA: Diagnosis not present

## 2019-07-16 DIAGNOSIS — I152 Hypertension secondary to endocrine disorders: Secondary | ICD-10-CM

## 2019-07-16 DIAGNOSIS — I10 Essential (primary) hypertension: Secondary | ICD-10-CM

## 2019-07-16 DIAGNOSIS — E1149 Type 2 diabetes mellitus with other diabetic neurological complication: Secondary | ICD-10-CM | POA: Diagnosis not present

## 2019-07-16 LAB — POCT GLYCOSYLATED HEMOGLOBIN (HGB A1C): Hemoglobin A1C: 5.5 % (ref 4.0–5.6)

## 2019-07-16 MED ORDER — TADALAFIL 5 MG PO TABS: 5.0000 mg | ORAL_TABLET | Freq: Every day | ORAL | 11 refills | Status: DC | PRN

## 2019-07-16 MED ORDER — AMBULATORY NON FORMULARY MEDICATION: 4 refills | Status: AC

## 2019-07-16 NOTE — Assessment & Plan Note (Signed)
Discontinue sildenafil, switching to Cialis with good Rx coupon.

## 2019-07-16 NOTE — Assessment & Plan Note (Signed)
Well-controlled essential hypertension, no change in medications.

## 2019-07-16 NOTE — Progress Notes (Signed)
    Procedures performed today:    Diabetic Foot Exam Both feet were examined, there are no signs of ulceration or abnormal callus. Nails are unremarkable. Dorsalis pedis and posterior tibial pulses are palpable. Sensation is intact to sharp and monofilament. Shoes are of appropriate fitment.  Independent interpretation of tests performed by another provider:   None.  Impression and Recommendations:    Hypertension associated with diabetes ([name]) Well-controlled essential hypertension, no change in medications.  Type 2 diabetes mellitus with neurological complications (H[facility]) Jonathan Oliver has diabetes. Good weight loss, continue current medications. A1c is well controlled. We did a foot exam today. I am going to get him set up for his eye exam.  Testosterone deficiency Rechecking testosterone, PSA  Erectile dysfunction due to arterial insufficiency Discontinue sildenafil, switching to Cialis with good Rx coupon.    ___________________________________________ Gwen Her. Dianah Field, M.D., ABFM., CAQSM. Primary Care and [place] Instructor of [place] [employer] [facility] [employer]

## 2019-07-16 NOTE — Assessment & Plan Note (Signed)
Rechecking testosterone, PSA

## 2019-07-16 NOTE — Assessment & Plan Note (Signed)
Jonathan Oliver has diabetes. Good weight loss, continue current medications. A1c is well controlled. We did a foot exam today. I am going to get him set up for his eye exam.

## 2019-07-28 LAB — HM DIABETES EYE EXAM

## 2019-08-01 LAB — CBC
HCT: 51.6 % — ABNORMAL HIGH (ref 38.5–50.0)
Hemoglobin: 17.8 g/dL — ABNORMAL HIGH (ref 13.2–17.1)
MCH: 32.2 pg (ref 27.0–33.0)
MCHC: 34.5 g/dL (ref 32.0–36.0)
MCV: 93.5 fL (ref 80.0–100.0)
MPV: 13.1 fL — ABNORMAL HIGH (ref 7.5–12.5)
Platelets: 171 10*3/uL (ref 140–400)
RBC: 5.52 10*6/uL (ref 4.20–5.80)
RDW: 13.3 % (ref 11.0–15.0)
WBC: 6.4 10*3/uL (ref 3.8–10.8)

## 2019-08-01 LAB — COMPLETE METABOLIC PANEL WITH GFR
AG Ratio: 1.5 (calc) (ref 1.0–2.5)
ALT: 29 U/L (ref 9–46)
AST: 29 U/L (ref 10–35)
Albumin: 4.1 g/dL (ref 3.6–5.1)
Alkaline phosphatase (APISO): 72 U/L (ref 35–144)
BUN: 12 mg/dL (ref 7–25)
CO2: 32 mmol/L (ref 20–32)
Calcium: 9.9 mg/dL (ref 8.6–10.3)
Chloride: 103 mmol/L (ref 98–110)
Creat: 1.32 mg/dL (ref 0.70–1.33)
GFR, Est African American: 71 mL/min/{1.73_m2} (ref >=60)
GFR, Est Non African American: 62 mL/min/{1.73_m2} (ref >=60)
Globulin: 2.8 g/dL (calc) (ref 1.9–3.7)
Glucose, Bld: 89 mg/dL (ref 65–99)
Potassium: 4.3 mmol/L (ref 3.5–5.3)
Sodium: 142 mmol/L (ref 135–146)
Total Bilirubin: 0.7 mg/dL (ref 0.2–1.2)
Total Protein: 6.9 g/dL (ref 6.1–8.1)

## 2019-08-01 LAB — LIPID PANEL W/REFLEX DIRECT LDL
Cholesterol: 144 mg/dL (ref <200)
HDL: 48 mg/dL (ref >=40)
LDL Cholesterol (Calc): 80 mg/dL (calc)
Non-HDL Cholesterol (Calc): 96 mg/dL (calc) (ref <130)
Total CHOL/HDL Ratio: 3 (calc) (ref <5.0)
Triglycerides: 78 mg/dL (ref <150)

## 2019-08-01 LAB — PSA, TOTAL AND FREE
PSA, % Free: 40 % (calc) (ref >25)
PSA, Free: 0.2 ng/mL
PSA, Total: 0.5 ng/mL (ref <=4.0)

## 2019-08-01 LAB — TSH: TSH: 0.73 mIU/L (ref 0.40–4.50)

## 2019-08-01 LAB — TESTOSTERONE, FREE & TOTAL
Free Testosterone: 180.1 pg/mL — ABNORMAL HIGH (ref 35.0–155.0)
Testosterone, Total, LC-MS-MS: 993 ng/dL (ref 250–1100)

## 2019-08-05 ENCOUNTER — Other Ambulatory Visit: Payer: Self-pay | Admitting: Sports Medicine

## 2019-08-05 DIAGNOSIS — E1159 Type 2 diabetes mellitus with other circulatory complications: Secondary | ICD-10-CM

## 2019-08-05 DIAGNOSIS — I10 Essential (primary) hypertension: Secondary | ICD-10-CM

## 2019-08-05 DIAGNOSIS — E349 Endocrine disorder, unspecified: Secondary | ICD-10-CM

## 2019-08-05 DIAGNOSIS — I152 Hypertension secondary to endocrine disorders: Secondary | ICD-10-CM

## 2019-08-05 MED ORDER — TESTOSTERONE CYPIONATE 200 MG/ML IM SOLN: 200.0000 mg | INTRAMUSCULAR | 1 refills | Status: DC

## 2019-08-05 MED ORDER — AMLODIPINE BESYLATE 5 MG PO TABS: 5.0000 mg | ORAL_TABLET | Freq: Every day | ORAL | 2 refills | Status: DC

## 2019-09-02 ENCOUNTER — Encounter: Payer: Self-pay | Admitting: Sports Medicine

## 2019-10-12 ENCOUNTER — Other Ambulatory Visit: Payer: Self-pay | Admitting: Sports Medicine

## 2019-10-12 DIAGNOSIS — I10 Essential (primary) hypertension: Secondary | ICD-10-CM

## 2019-11-01 ENCOUNTER — Other Ambulatory Visit: Payer: Self-pay | Admitting: Sports Medicine

## 2019-11-01 DIAGNOSIS — N5201 Erectile dysfunction due to arterial insufficiency: Secondary | ICD-10-CM

## 2019-11-08 ENCOUNTER — Other Ambulatory Visit: Payer: Self-pay | Admitting: Sports Medicine

## 2019-11-08 DIAGNOSIS — N5201 Erectile dysfunction due to arterial insufficiency: Secondary | ICD-10-CM

## 2020-01-03 ENCOUNTER — Other Ambulatory Visit: Payer: Self-pay | Admitting: Sports Medicine

## 2020-01-03 ENCOUNTER — Ambulatory Visit (INDEPENDENT_AMBULATORY_CARE_PROVIDER_SITE_OTHER): Payer: BC Managed Care – PPO | Admitting: Family Medicine

## 2020-01-03 ENCOUNTER — Other Ambulatory Visit: Payer: Self-pay

## 2020-01-03 ENCOUNTER — Encounter: Payer: Self-pay | Admitting: Family Medicine

## 2020-01-03 VITALS — BP 107/70 | HR 79 | Temp 98.4°F | Wt 232.0 lb

## 2020-01-03 DIAGNOSIS — E1149 Type 2 diabetes mellitus with other diabetic neurological complication: Secondary | ICD-10-CM

## 2020-01-03 DIAGNOSIS — J301 Allergic rhinitis due to pollen: Secondary | ICD-10-CM

## 2020-01-03 DIAGNOSIS — J302 Other seasonal allergic rhinitis: Secondary | ICD-10-CM | POA: Diagnosis not present

## 2020-01-03 DIAGNOSIS — J309 Allergic rhinitis, unspecified: Secondary | ICD-10-CM

## 2020-01-03 HISTORY — DX: Allergic rhinitis, unspecified: J30.9

## 2020-01-03 MED ORDER — METHYLPREDNISOLONE ACETATE 80 MG/ML IJ SUSP
80.0000 mg | Freq: Once | INTRAMUSCULAR | Status: AC
  Administered 2020-01-03: 80 mg via INTRAMUSCULAR

## 2020-01-03 NOTE — Assessment & Plan Note (Signed)
Good relief with steroid injection previously Injection of depo-medrol given today.  Recommend that he continue allegra daily. Can add flonase as well.  Call for new or worsening symptoms.

## 2020-01-03 NOTE — Patient Instructions (Signed)
Continue allegra daily  Flonase nasal spray may also be helpful for your symptoms.  If they continue to worsen or are not improving let me know.

## 2020-01-03 NOTE — Progress Notes (Signed)
Jonathan Oliver. - 54 y.o. male MRN [id number]  Date of birth: July 03, 1965  Subjective Chief Complaint  Patient presents with  . Nasal Congestion    HPI Jonathan Oliver. is a 54 y.o. male with history of T2DM, seasonal allergies and HTN here today with complaint of congestion, post nasal drainage, runny nose and watery and itchy eyes.   Symptoms started a few days ago.  He does landscaping on the side and reports symptoms worsened after cutting grass.  He denies fever, chills, headache, shortness of breath, body aches.  He has had steroid shot in the past that has provided relief.  He is taking allegra as needed.   ROS:  A comprehensive ROS was completed and negative except as noted per HPI  No Known Allergies  Past Medical History:  Diagnosis Date  . Allergy   . Arthritis    gout  . Diabetes mellitus without complication ([place])   . Gout   . Headache   . Hypertension   . IFG (impaired fasting glucose)   . Kidney stones   . MRSA (methicillin resistant Staphylococcus aureus)     Past Surgical History:  Procedure Laterality Date  . COLONOSCOPY     20 yrs ago [place] normal per pt   . ELBOW SURGERY Right    x 2  . sinus surgey    . tumor removed side of face     age 66 or 62   . uvuloplasty for OSA      Social History   Socioeconomic History  . Marital status: Married    Spouse name: Not on file  . Number of children: 3  . Years of education: HS  . Highest education level: Not on file  Occupational History  . Occupation: Maintenance  Tobacco Use  . Smoking status: Never Smoker  . Smokeless tobacco: Never Used  Vaping Use  . Vaping Use: Never used  Substance and Sexual Activity  . Alcohol use: Yes    Alcohol/week: 42.0 standard drinks    Types: 42 Cans of beer per week    Comment: 6-8 beers a day  . Drug use: No  . Sexual activity: Not on file  Other Topics Concern  . Not on file  Social History Narrative   Lives at home with his spouse.   Right-handed.    1 can of [facility] per day.   Social Determinants of Health   Financial Resource Strain:   . Difficulty of Paying Living Expenses:   Food Insecurity:   . Worried About Charity fundraiser in the Last Year:   . Arboriculturist in the Last Year:   Transportation Needs:   . Film/video editor (Medical):   Marland Kitchen Lack of Transportation (Non-Medical):   Physical Activity:   . Days of Exercise per Week:   . Minutes of Exercise per Session:   Stress:   . Feeling of Stress :   Social Connections:   . Frequency of Communication with Friends and Family:   . Frequency of Social Gatherings with Friends and Family:   . Attends Religious Services:   . Active Member of Clubs or Organizations:   . Attends Archivist Meetings:   Marland Kitchen Marital Status:     Family History  Problem Relation Age of Onset  . Cancer Mother        Marena Chancy - thinks it started in her lungs.  . Cancer Father  Unsure of type.  . Diabetes Maternal Grandfather   . Diabetes Maternal Aunt   . Hypertension Neg Hx   . Colon cancer Neg Hx   . Colon polyps Neg Hx   . Esophageal cancer Neg Hx   . Rectal cancer Neg Hx   . Stomach cancer Neg Hx     Health Maintenance  Topic Date Due  . Hepatitis C Screening  Never done  . COVID-19 Vaccine (1) Never done  . HEMOGLOBIN A1C  01/13/2020  . FOOT EXAM  07/15/2020  . OPHTHALMOLOGY EXAM  07/27/2020  . TETANUS/TDAP  06/18/2021  . COLONOSCOPY  03/07/2029  . PNEUMOCOCCAL POLYSACCHARIDE VACCINE AGE 36-64 HIGH RISK  Completed  . HIV Screening  Completed     ----------------------------------------------------------------------------------------------------------------------------------------------------------------------------------------------------------------- Physical Exam BP 107/70 (BP Location: Right Arm, Patient Position: Sitting, Cuff Size: Large)   Pulse 79   Temp 98.4 F (36.9 C) (Oral)   Wt 232 lb (105.2 kg)   SpO2 97%   BMI 27.51 kg/m    Physical Exam Constitutional:      Appearance: Normal appearance.  HENT:     Head: Normocephalic and atraumatic.  Eyes:     General: No scleral icterus. Cardiovascular:     Rate and Rhythm: Normal rate and regular rhythm.  Pulmonary:     Effort: Pulmonary effort is normal.     Breath sounds: Normal breath sounds.  Musculoskeletal:     Cervical back: Neck supple.  Neurological:     General: No focal deficit present.     Mental Status: He is alert.  Psychiatric:        Mood and Affect: Mood normal.     ------------------------------------------------------------------------------------------------------------------------------------------------------------------------------------------------------------------- Assessment and Plan  Allergic rhinitis Good relief with steroid injection previously Injection of depo-medrol given today.  Recommend that he continue allegra daily. Can add flonase as well.  Call for new or worsening symptoms.    Meds ordered this encounter  Medications  . methylPREDNISolone acetate (DEPO-MEDROL) injection 80 mg    No follow-ups on file.    This visit occurred during the SARS-CoV-2 public health emergency.  Safety protocols were in place, including screening questions prior to the visit, additional usage of staff PPE, and extensive cleaning of exam room while observing appropriate contact time as indicated for disinfecting solutions.

## 2020-01-14 ENCOUNTER — Other Ambulatory Visit: Payer: Self-pay

## 2020-01-14 ENCOUNTER — Ambulatory Visit (INDEPENDENT_AMBULATORY_CARE_PROVIDER_SITE_OTHER): Payer: BC Managed Care – PPO | Admitting: Sports Medicine

## 2020-01-14 ENCOUNTER — Encounter: Payer: Self-pay | Admitting: Sports Medicine

## 2020-01-14 VITALS — BP 118/75 | HR 91 | Ht 77.0 in | Wt 229.0 lb

## 2020-01-14 DIAGNOSIS — E119 Type 2 diabetes mellitus without complications: Secondary | ICD-10-CM

## 2020-01-14 DIAGNOSIS — E1149 Type 2 diabetes mellitus with other diabetic neurological complication: Secondary | ICD-10-CM | POA: Diagnosis not present

## 2020-01-14 LAB — POCT GLYCOSYLATED HEMOGLOBIN (HGB A1C): Hemoglobin A1C: 5.3 % (ref 4.0–5.6)

## 2020-01-14 NOTE — Assessment & Plan Note (Signed)
Stepehn returns, he is doing extremely well, his hemoglobin A1c is 5.3%, he has lost a great amount of weight, he does well on Metformin and Ozempic. Up-to-date on his screening tests, he can return to see me in 1 year.

## 2020-01-14 NOTE — Progress Notes (Signed)
    Procedures performed today:    Hemoglobin A1c point-of-care performed =5.3%.  Independent interpretation of notes and tests performed by another provider:   None.  Brief History, Exam, Impression, and Recommendations:    Type 2 diabetes mellitus with neurological complications ([name]) Jonathan Oliver returns, he is doing extremely well, his hemoglobin A1c is 5.3%, he has lost a great amount of weight, he does well on Metformin and Ozempic. Up-to-date on his screening tests, he can return to see me in 1 year.    ___________________________________________ Gwen Her. Dianah Field, M.D., ABFM., CAQSM. Primary Care and [place] Instructor of [place] of [facility]

## 2020-01-30 ENCOUNTER — Other Ambulatory Visit: Payer: Self-pay | Admitting: Sports Medicine

## 2020-01-30 DIAGNOSIS — E349 Endocrine disorder, unspecified: Secondary | ICD-10-CM

## 2020-05-03 ENCOUNTER — Other Ambulatory Visit: Payer: Self-pay | Admitting: Sports Medicine

## 2020-05-14 ENCOUNTER — Other Ambulatory Visit: Payer: Self-pay | Admitting: Sports Medicine

## 2020-05-14 DIAGNOSIS — M1000 Idiopathic gout, unspecified site: Secondary | ICD-10-CM

## 2020-05-14 DIAGNOSIS — I10 Essential (primary) hypertension: Secondary | ICD-10-CM

## 2020-05-14 DIAGNOSIS — I152 Hypertension secondary to endocrine disorders: Secondary | ICD-10-CM

## 2020-05-14 DIAGNOSIS — E1159 Type 2 diabetes mellitus with other circulatory complications: Secondary | ICD-10-CM

## 2020-06-17 IMAGING — CT CT CHEST W/O CM
2 of 3 series · 15 of 36 positions shown, 18 images · non-contrast
Comparison: None.

CLINICAL DATA: Rib fracture suspected. Chest wall trauma. Concern
for fourth and fifth rib fractures along the anterior axillary line
on the left.

EXAM:
CT CHEST WITHOUT CONTRAST
TECHNIQUE: Multidetector CT imaging of the chest was performed following the
standard protocol without IV contrast.

[Series 2: thorax · axial · 0.80mm/px · z∈[-289,-59]mm · 12 of 55 slices shown, 15 images]
[im 5/55  mediastinal]
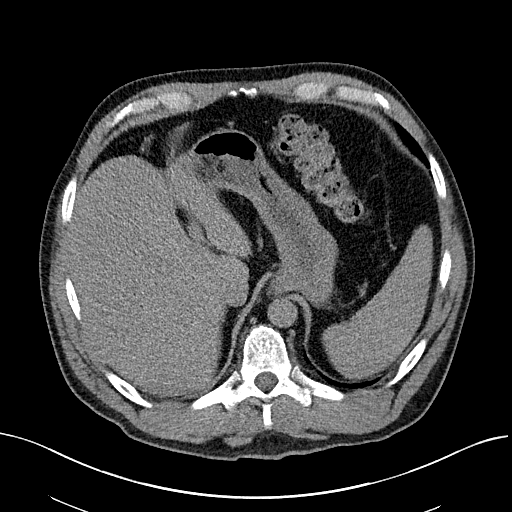
[im 5/55  lung]
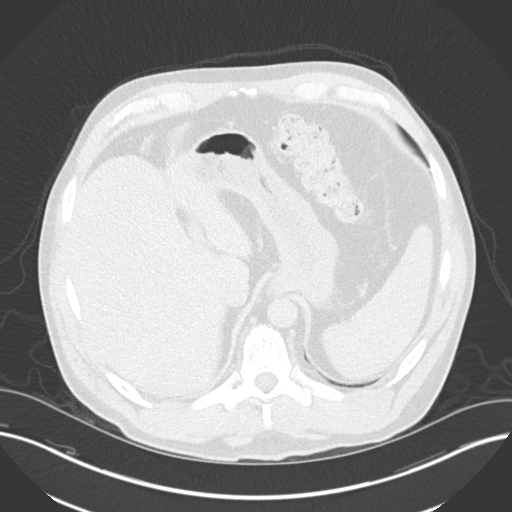
[im 9/55  lung]
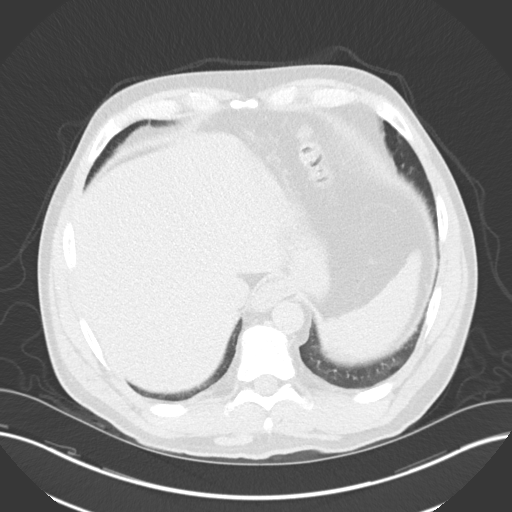
[im 13/55  lung]
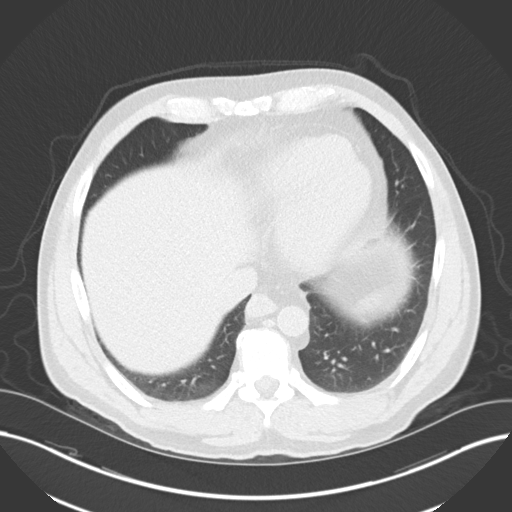
[im 17/55  lung]
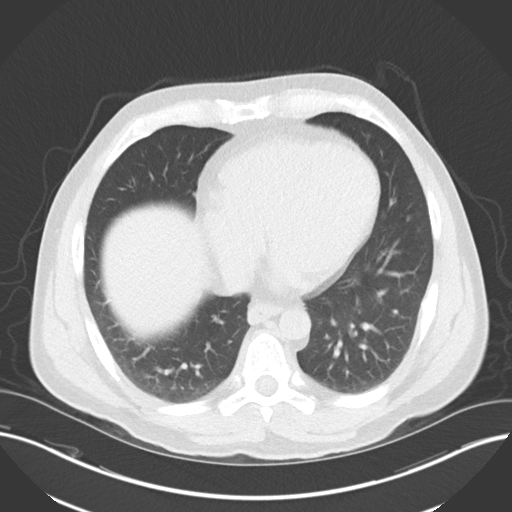
[im 21/55  mediastinal]
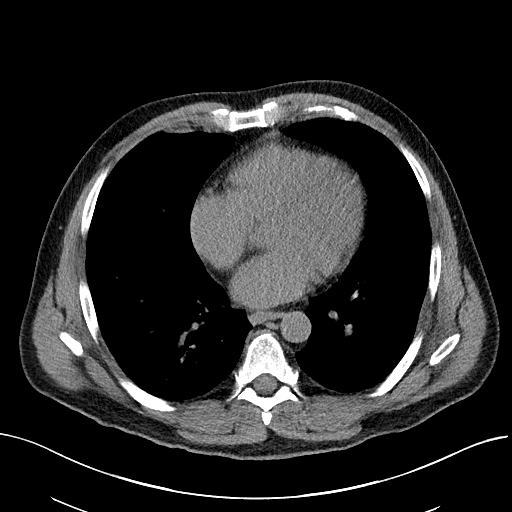
[im 21/55  lung]
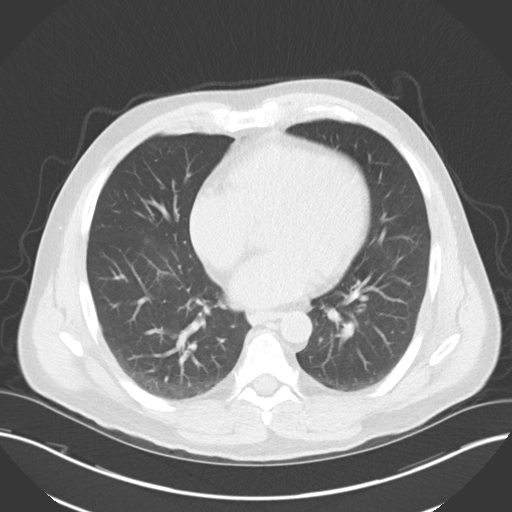
[im 25/55  lung]
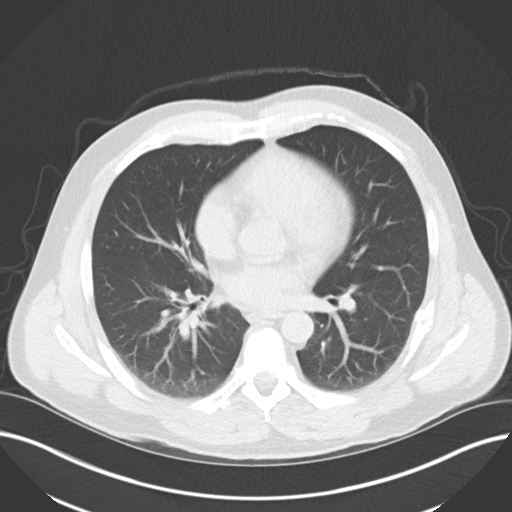
[im 31/55  lung]
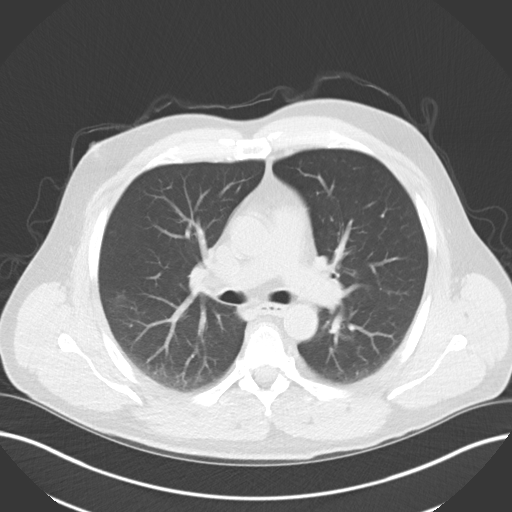
[im 35/55  lung]
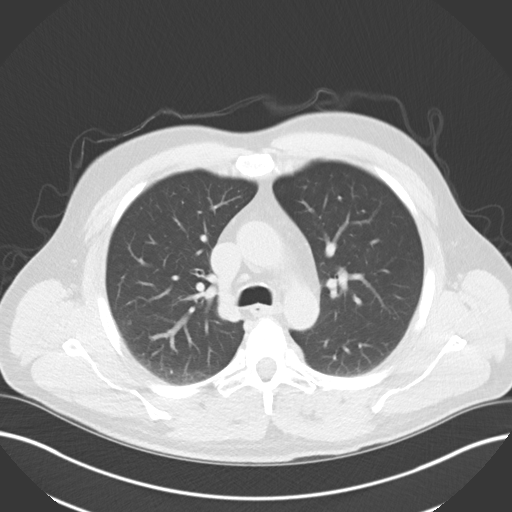
[im 39/55  mediastinal]
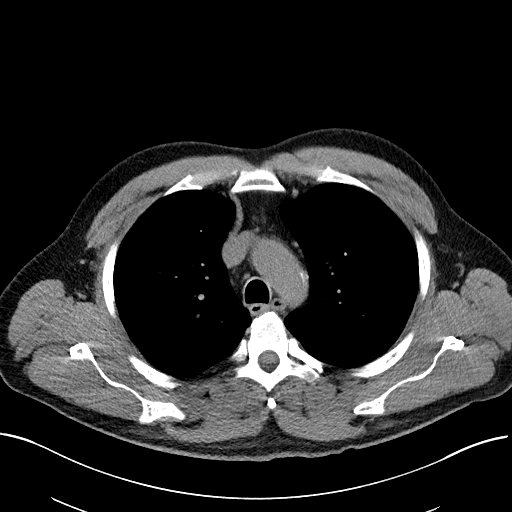
[im 39/55  lung]
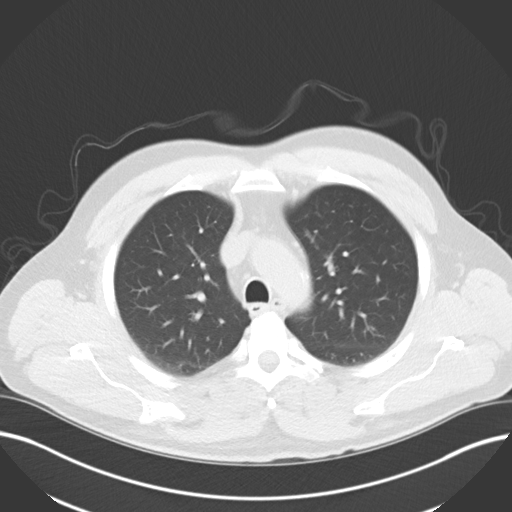
[im 43/55  lung]
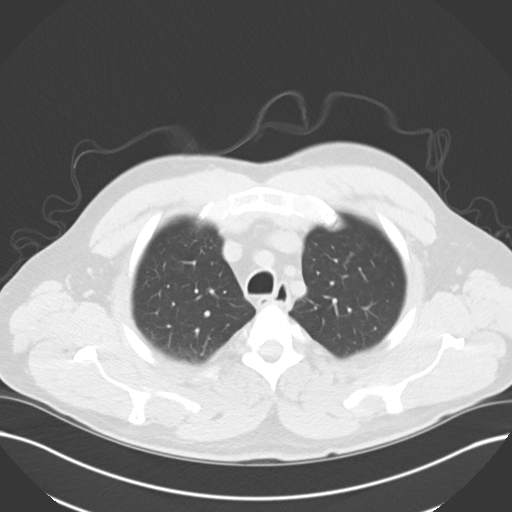
[im 47/55  lung]
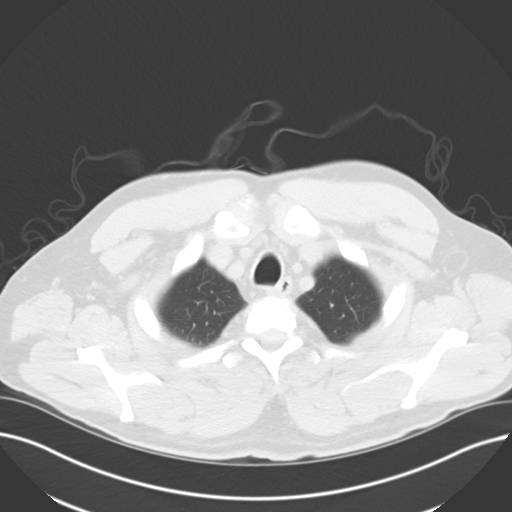
[im 51/55  lung]
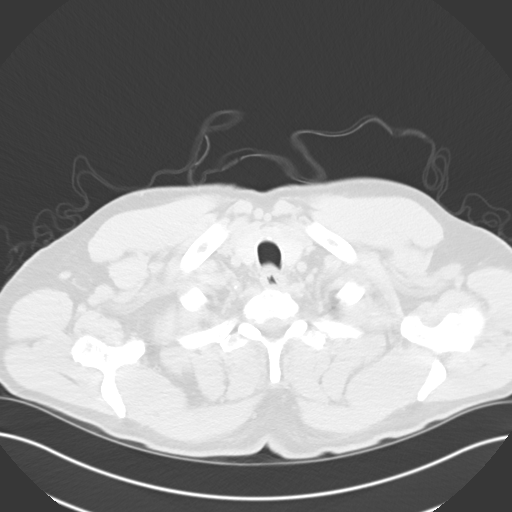

[Series 5: coronal · coronal · 0.59mm/px · 3 of 156 slices shown]
[im 32/156  lung]
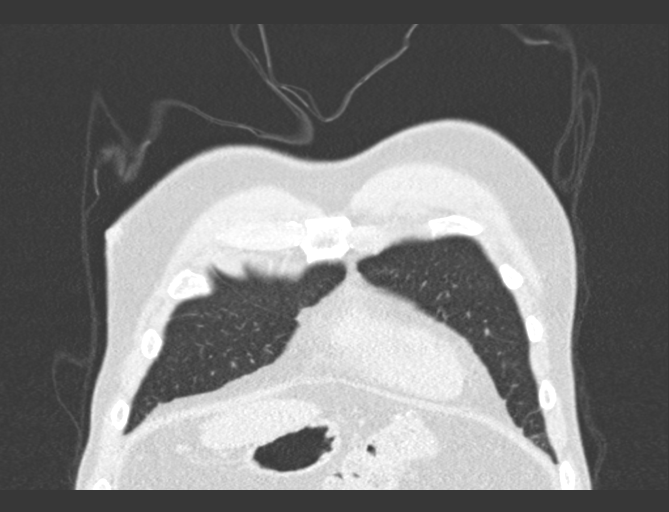
[im 63/156  lung]
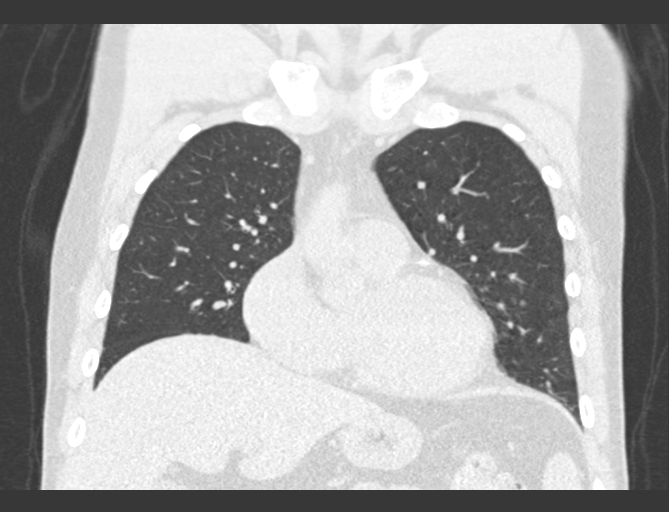
[im 94/156  lung]
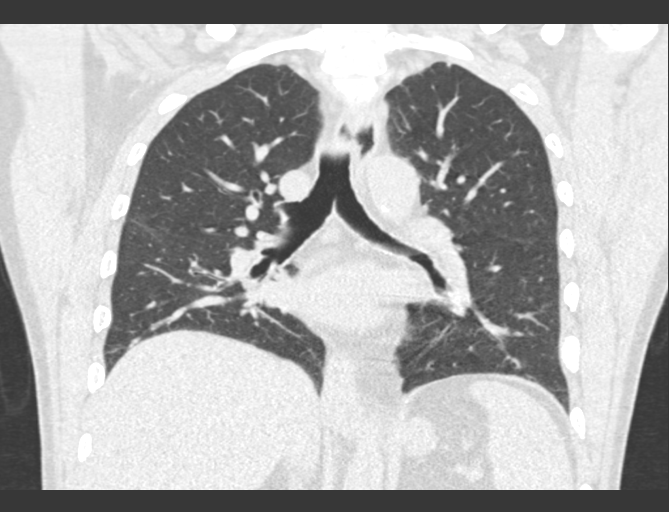

[15 of 36 positions shown; findings below may reference images not displayed]

FINDINGS: Cardiovascular: The heart size is normal. Coronary artery
calcifications are noted. Mild atherosclerotic changes are noted of
the thoracic aorta without evidence for an aneurysm. There is no
significant pericardial effusion.

Mediastinum/Nodes:

--No mediastinal or hilar lymphadenopathy.

--No axillary lymphadenopathy.

--No supraclavicular lymphadenopathy.

--Normal thyroid gland.

--The esophagus is unremarkable

Lungs/Pleura: No pulmonary nodules or masses. No pleural effusion or
pneumothorax. No focal airspace consolidation. No focal pleural
abnormality.

Upper Abdomen: No acute abnormality.

Musculoskeletal: There are subacute fractures involving the left
fourth through seventh ribs laterally on the left. Fifth through
seventh ribs are fractured both laterally and posteriorly.

Review of the MIP images confirms the above findings.
IMPRESSION: 1. There are subacute fractures involving the left fourth through
seventh ribs. The fifth through seventh ribs are fractured both
laterally and posteriorly.
2. No evidence for pneumothorax.
3. Coronary artery disease.
4. Aortic atherosclerosis.

Aortic Atherosclerosis ([id number]).

## 2020-07-17 ENCOUNTER — Encounter: Payer: BC Managed Care – PPO | Admitting: Sports Medicine

## 2020-07-27 ENCOUNTER — Other Ambulatory Visit: Payer: Self-pay | Admitting: Sports Medicine

## 2020-07-27 DIAGNOSIS — N5201 Erectile dysfunction due to arterial insufficiency: Secondary | ICD-10-CM

## 2020-08-01 ENCOUNTER — Other Ambulatory Visit: Payer: Self-pay | Admitting: Sports Medicine

## 2020-08-01 DIAGNOSIS — I10 Essential (primary) hypertension: Secondary | ICD-10-CM

## 2020-08-04 ENCOUNTER — Other Ambulatory Visit: Payer: Self-pay | Admitting: Sports Medicine

## 2020-08-04 DIAGNOSIS — E349 Endocrine disorder, unspecified: Secondary | ICD-10-CM

## 2020-08-13 ENCOUNTER — Other Ambulatory Visit: Payer: Self-pay | Admitting: Sports Medicine

## 2020-08-13 DIAGNOSIS — I10 Essential (primary) hypertension: Secondary | ICD-10-CM

## 2020-12-01 ENCOUNTER — Other Ambulatory Visit: Payer: Self-pay | Admitting: Sports Medicine

## 2020-12-01 DIAGNOSIS — E349 Endocrine disorder, unspecified: Secondary | ICD-10-CM

## 2020-12-02 ENCOUNTER — Other Ambulatory Visit: Payer: Self-pay | Admitting: Sports Medicine

## 2020-12-02 DIAGNOSIS — M1000 Idiopathic gout, unspecified site: Secondary | ICD-10-CM

## 2021-01-12 ENCOUNTER — Encounter: Payer: Self-pay | Admitting: Sports Medicine

## 2021-01-12 ENCOUNTER — Ambulatory Visit (INDEPENDENT_AMBULATORY_CARE_PROVIDER_SITE_OTHER): Payer: BC Managed Care – PPO | Admitting: Sports Medicine

## 2021-01-12 VITALS — BP 121/76 | HR 65 | Ht 77.0 in | Wt 248.0 lb

## 2021-01-12 DIAGNOSIS — E538 Deficiency of other specified B group vitamins: Secondary | ICD-10-CM | POA: Diagnosis not present

## 2021-01-12 DIAGNOSIS — M1A0620 Idiopathic chronic gout, left knee, without tophus (tophi): Secondary | ICD-10-CM

## 2021-01-12 DIAGNOSIS — Z0000 Encounter for general adult medical examination without abnormal findings: Secondary | ICD-10-CM

## 2021-01-12 DIAGNOSIS — E1159 Type 2 diabetes mellitus with other circulatory complications: Secondary | ICD-10-CM

## 2021-01-12 DIAGNOSIS — I152 Hypertension secondary to endocrine disorders: Secondary | ICD-10-CM

## 2021-01-12 DIAGNOSIS — E1149 Type 2 diabetes mellitus with other diabetic neurological complication: Secondary | ICD-10-CM | POA: Diagnosis not present

## 2021-01-12 DIAGNOSIS — E349 Endocrine disorder, unspecified: Secondary | ICD-10-CM

## 2021-01-12 DIAGNOSIS — D751 Secondary polycythemia: Secondary | ICD-10-CM

## 2021-01-12 MED ORDER — TESTOSTERONE CYPIONATE 200 MG/ML IM SOLN: 200.0000 mg | INTRAMUSCULAR | 0 refills | Status: DC

## 2021-01-12 NOTE — Assessment & Plan Note (Signed)
Continue current antidiabetic medications, rechecking labs. Stable chronic illness.

## 2021-01-12 NOTE — Progress Notes (Addendum)
    Procedures performed today:    None.  Independent interpretation of notes and tests performed by another provider:   None.  Brief History, Exam, Impression, and Recommendations:    Annual physical exam I was able to follow-up with Jeneen Rinks again today, we will check all his routine labs, A1c, we are getting him caught up on some of his clinical measures.   Gout Stable and controlled, rechecking uric acid levels.  Hypertension associated with diabetes ([place]) Stable and controlled, no change in plan.  Testosterone deficiency Stable and well-controlled, rechecking testosterone levels, refilling testosterone.  Type 2 diabetes mellitus with neurological complications (HCC) Continue current antidiabetic medications, rechecking labs. Stable chronic illness.  Secondary erythrocytosis Hemoglobin levels are very high, he needs to get back in with his hematologist for discussion of repeat phlebotomy.  B12 deficiency B12 levels did come back at the very bottom of the normal range, I would like him to do some B12 supplementation daily, we will start with oral supplementation, I will call this in.  We can check labs in a month, at which point I think we should check hemoglobin again.    ___________________________________________ Gwen Her. Dianah Field, M.D., ABFM., CAQSM. Primary Care and [place] Instructor of [place] of [facility]

## 2021-01-12 NOTE — Assessment & Plan Note (Signed)
Stable and well-controlled, rechecking testosterone levels, refilling testosterone.

## 2021-01-12 NOTE — Assessment & Plan Note (Signed)
Stable and controlled, no change in plan.

## 2021-01-12 NOTE — Assessment & Plan Note (Signed)
I was able to follow-up with Jonathan Oliver again today, we will check all his routine labs, A1c, we are getting him caught up on some of his clinical measures.

## 2021-01-12 NOTE — Assessment & Plan Note (Signed)
Stable and controlled, rechecking uric acid levels.

## 2021-01-14 NOTE — Assessment & Plan Note (Signed)
Hemoglobin levels are very high, he needs to get back in with his hematologist for discussion of repeat phlebotomy.

## 2021-01-15 MED ORDER — VITAMIN B-12 1000 MCG PO TABS: 1000.0000 ug | ORAL_TABLET | Freq: Every day | ORAL | 11 refills | Status: DC

## 2021-01-15 NOTE — Assessment & Plan Note (Signed)
B12 levels did come back at the very bottom of the normal range, I would like him to do some B12 supplementation daily, we will start with oral supplementation, I will call this in.  We can check labs in a month, at which point I think we should check hemoglobin again.

## 2021-01-15 NOTE — Addendum Note (Signed)
Addended by: Silverio Decamp on: 01/15/2021 02:16 PM   Modules accepted: Orders

## 2021-01-16 ENCOUNTER — Telehealth: Payer: Self-pay | Admitting: Sports Medicine

## 2021-01-16 DIAGNOSIS — D751 Secondary polycythemia: Secondary | ICD-10-CM

## 2021-01-16 LAB — CBC
HCT: 61.9 % — ABNORMAL HIGH (ref 38.5–50.0)
Hemoglobin: 21.5 g/dL — ABNORMAL HIGH (ref 13.2–17.1)
MCH: 31.9 pg (ref 27.0–33.0)
MCHC: 34.7 g/dL (ref 32.0–36.0)
MCV: 91.7 fL (ref 80.0–100.0)
MPV: 12.2 fL (ref 7.5–12.5)
Platelets: 178 10*3/uL (ref 140–400)
RBC: 6.75 10*6/uL — ABNORMAL HIGH (ref 4.20–5.80)
RDW: 15.2 % — ABNORMAL HIGH (ref 11.0–15.0)
WBC: 7.7 10*3/uL (ref 3.8–10.8)

## 2021-01-16 LAB — COMPREHENSIVE METABOLIC PANEL
AG Ratio: 1.6 (calc) (ref 1.0–2.5)
ALT: 43 U/L (ref 9–46)
AST: 33 U/L (ref 10–35)
Albumin: 4.2 g/dL (ref 3.6–5.1)
Alkaline phosphatase (APISO): 71 U/L (ref 35–144)
BUN/Creatinine Ratio: 20 (calc) (ref 6–22)
BUN: 32 mg/dL — ABNORMAL HIGH (ref 7–25)
CO2: 26 mmol/L (ref 20–32)
Calcium: 10.9 mg/dL — ABNORMAL HIGH (ref 8.6–10.3)
Chloride: 104 mmol/L (ref 98–110)
Creat: 1.6 mg/dL — ABNORMAL HIGH (ref 0.70–1.30)
Globulin: 2.7 g/dL (calc) (ref 1.9–3.7)
Glucose, Bld: 108 mg/dL — ABNORMAL HIGH (ref 65–99)
Potassium: 4.5 mmol/L (ref 3.5–5.3)
Sodium: 139 mmol/L (ref 135–146)
Total Bilirubin: 1.1 mg/dL (ref 0.2–1.2)
Total Protein: 6.9 g/dL (ref 6.1–8.1)

## 2021-01-16 LAB — TSH: TSH: 1.09 mIU/L (ref 0.40–4.50)

## 2021-01-16 LAB — HEMOGLOBIN A1C
Hgb A1c MFr Bld: 6 % of total Hgb — ABNORMAL HIGH (ref <5.7)
Mean Plasma Glucose: 126 mg/dL
eAG (mmol/L): 7 mmol/L

## 2021-01-16 LAB — PSA, TOTAL AND FREE
PSA, % Free: 40 % (calc) (ref >25)
PSA, Free: 0.2 ng/mL
PSA, Total: 0.5 ng/mL (ref <=4.0)

## 2021-01-16 LAB — LIPID PANEL
Cholesterol: 168 mg/dL (ref <200)
HDL: 40 mg/dL (ref >=40)
LDL Cholesterol (Calc): 101 mg/dL (calc) — ABNORMAL HIGH
Non-HDL Cholesterol (Calc): 128 mg/dL (calc) (ref <130)
Total CHOL/HDL Ratio: 4.2 (calc) (ref <5.0)
Triglycerides: 168 mg/dL — ABNORMAL HIGH (ref <150)

## 2021-01-16 LAB — TESTOSTERONE, FREE & TOTAL
Free Testosterone: 25.3 pg/mL — ABNORMAL LOW (ref 35.0–155.0)
Testosterone, Total, LC-MS-MS: 307 ng/dL (ref 250–1100)

## 2021-01-16 LAB — URIC ACID: Uric Acid, Serum: 6.8 mg/dL (ref 4.0–8.0)

## 2021-01-16 LAB — VITAMIN B12: Vitamin B-12: 265 pg/mL (ref 200–1100)

## 2021-01-16 NOTE — Assessment & Plan Note (Signed)
Hemoglobin levels continue to be high, he needs a referral back to hematology for discussion of repeat phlebotomy. Testosterone levels are very low currently so I question the contribution of his testosterone supplementation as opposed to a primary polycythemia.

## 2021-01-16 NOTE — Telephone Encounter (Signed)
Dr. Florene Route called and stated he needed a referral to see hematology to set up his appointments for Phlebotomy?  He used to go to [place] and stated he would like to go back there.   Jenny Reichmann

## 2021-01-16 NOTE — Telephone Encounter (Signed)
No problem, referral placed.

## 2021-02-19 ENCOUNTER — Other Ambulatory Visit: Payer: BC Managed Care – PPO

## 2021-02-19 ENCOUNTER — Ambulatory Visit: Payer: BC Managed Care – PPO | Admitting: Family

## 2021-02-19 ENCOUNTER — Other Ambulatory Visit: Payer: Self-pay | Admitting: Family

## 2021-02-19 DIAGNOSIS — D751 Secondary polycythemia: Secondary | ICD-10-CM

## 2021-02-20 ENCOUNTER — Ambulatory Visit: Payer: BC Managed Care – PPO | Admitting: Family

## 2021-02-20 ENCOUNTER — Inpatient Hospital Stay: Payer: BC Managed Care – PPO

## 2021-02-20 ENCOUNTER — Other Ambulatory Visit: Payer: Self-pay

## 2021-02-20 ENCOUNTER — Inpatient Hospital Stay: Payer: BC Managed Care – PPO | Attending: Hematology & Oncology

## 2021-02-20 ENCOUNTER — Inpatient Hospital Stay (HOSPITAL_BASED_OUTPATIENT_CLINIC_OR_DEPARTMENT_OTHER): Payer: BC Managed Care – PPO | Admitting: Family

## 2021-02-20 ENCOUNTER — Other Ambulatory Visit: Payer: BC Managed Care – PPO

## 2021-02-20 ENCOUNTER — Encounter: Payer: Self-pay | Admitting: Family

## 2021-02-20 VITALS — BP 110/74 | HR 72 | Temp 98.0°F | Resp 18 | Ht 77.0 in | Wt 245.8 lb

## 2021-02-20 DIAGNOSIS — I10 Essential (primary) hypertension: Secondary | ICD-10-CM | POA: Insufficient documentation

## 2021-02-20 DIAGNOSIS — D751 Secondary polycythemia: Secondary | ICD-10-CM | POA: Diagnosis present

## 2021-02-20 DIAGNOSIS — Z809 Family history of malignant neoplasm, unspecified: Secondary | ICD-10-CM

## 2021-02-20 DIAGNOSIS — E119 Type 2 diabetes mellitus without complications: Secondary | ICD-10-CM

## 2021-02-20 LAB — CMP (CANCER CENTER ONLY)
ALT: 41 U/L (ref 0–44)
AST: 32 U/L (ref 15–41)
Albumin: 4.3 g/dL (ref 3.5–5.0)
Alkaline Phosphatase: 78 U/L (ref 38–126)
Anion gap: 8 (ref 5–15)
BUN: 32 mg/dL — ABNORMAL HIGH (ref 6–20)
CO2: 29 mmol/L (ref 22–32)
Calcium: 10.4 mg/dL — ABNORMAL HIGH (ref 8.9–10.3)
Chloride: 102 mmol/L (ref 98–111)
Creatinine: 1.77 mg/dL — ABNORMAL HIGH (ref 0.61–1.24)
GFR, Estimated: 45 mL/min — ABNORMAL LOW (ref >60)
Glucose, Bld: 165 mg/dL — ABNORMAL HIGH (ref 70–99)
Potassium: 4.4 mmol/L (ref 3.5–5.1)
Sodium: 139 mmol/L (ref 135–145)
Total Bilirubin: 1.1 mg/dL (ref 0.3–1.2)
Total Protein: 7.4 g/dL (ref 6.5–8.1)

## 2021-02-20 LAB — CBC WITH DIFFERENTIAL (CANCER CENTER ONLY)
Abs Immature Granulocytes: 0.06 10*3/uL (ref 0.00–0.07)
Basophils Absolute: 0.1 10*3/uL (ref 0.0–0.1)
Basophils Relative: 1 %
Eosinophils Absolute: 0.3 10*3/uL (ref 0.0–0.5)
Eosinophils Relative: 5 %
HCT: 59 % — ABNORMAL HIGH (ref 39.0–52.0)
Hemoglobin: 20.3 g/dL — ABNORMAL HIGH (ref 13.0–17.0)
Immature Granulocytes: 1 %
Lymphocytes Relative: 20 %
Lymphs Abs: 1.3 10*3/uL (ref 0.7–4.0)
MCH: 32.1 pg (ref 26.0–34.0)
MCHC: 34.4 g/dL (ref 30.0–36.0)
MCV: 93.4 fL (ref 80.0–100.0)
Monocytes Absolute: 0.8 10*3/uL (ref 0.1–1.0)
Monocytes Relative: 12 %
Neutro Abs: 4 10*3/uL (ref 1.7–7.7)
Neutrophils Relative %: 61 %
Platelet Count: 154 10*3/uL (ref 150–400)
RBC: 6.32 MIL/uL — ABNORMAL HIGH (ref 4.22–5.81)
RDW: 15.2 % (ref 11.5–15.5)
WBC Count: 6.6 10*3/uL (ref 4.0–10.5)
nRBC: 0 % (ref 0.0–0.2)

## 2021-02-20 LAB — RETICULOCYTES
Immature Retic Fract: 5 % (ref 2.3–15.9)
RBC.: 6.27 MIL/uL — ABNORMAL HIGH (ref 4.22–5.81)
Retic Count, Absolute: 126.7 10*3/uL (ref 19.0–186.0)
Retic Ct Pct: 2 % (ref 0.4–3.1)

## 2021-02-20 LAB — LACTATE DEHYDROGENASE: LDH: 156 U/L (ref 98–192)

## 2021-02-20 LAB — SAVE SMEAR(SSMR), FOR PROVIDER SLIDE REVIEW

## 2021-02-20 NOTE — Patient Instructions (Signed)

## 2021-02-20 NOTE — Progress Notes (Addendum)
Hematology/Oncology Consultation   Name: Jonathan Oliver.      MRN: [id number]    Location: Room/bed info not found  Date: 02/20/2021 Time:2:49 PM   REFERRING PHYSICIAN:  Aundria Mems, MD  REASON FOR CONSULT:  Erythrocytosis    DIAGNOSIS: Hemochromatosis, heterozygous for the H63D mutation  HISTORY OF PRESENT ILLNESS: Jonathan Oliver is a very pleasant 55 yo caucasian gentleman with erythrocytosis noted all the way back to 2011.  He has been phlebotomized in the past regularly and states that he was donating with the [facility] prior to Covid.  He states that his last phlebotomy was one year ago.  His complexion is quite [place] and he is symptomatic with fatigue.  Hgb is 20.3, Hct 59%, MCV 93, platelets 154 and WBC count 6.6.  He has history of sleep apnea and had the surgery to remove his tonsils, correct his deviated septum and repair his soft palate years ago. He states that about 6 years ago he broke his nose and snores like he did prior to his surgery.  He stopped his testosterone supplementation 4 months ago when he quit going to the gym.  He does not smoke or use recreational drugs.  He has 1-3 beers per day.  He states that his paternal grandfather had "thick blood".  No personal or known familial history of heart disease or liver disease.  No personal history of cancer. Family history include mother with lung metastasized to bone and father with lung. Maternal grandparents and great grand parents had cancer with unknown primaries.  He is diabetic and is on Ozempic and Metformin which he states keeps his blood sugars well controlled.  No history of thyroid disease.  He rarely has SOB with over exertion.  No fever, chills, n/v, cough, rash, dizziness, headache, blurred/loss of vision, chest pain, abdominal pain or changes in bowel or bladder habits.  He has some abdominal bloating with certain foods. He had a colonoscopy in September 2020 which showed inflammatory bowel disease. He  had 1 benign polyp removed.  He has maintained a good appetite but admits that he needs to better hydrate throughout the day. His weight is stable at 231 lbs.  No swelling or tenderness in his extremities.  The neuropathy in his feet is unchanged from baseline.  No falls or syncope to report.  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   ROS: All other 10 point review of systems is negative.   PAST MEDICAL HISTORY:   Past Medical History:  Diagnosis Date   Allergy    Arthritis    gout   Diabetes mellitus without complication (HCC)    Gout    Headache    Hypertension    IFG (impaired fasting glucose)    Kidney stones    MRSA (methicillin resistant Staphylococcus aureus)     ALLERGIES: No Known Allergies    MEDICATIONS:  Current Outpatient Medications on File Prior to Visit  Medication Sig Dispense Refill   allopurinol (ZYLOPRIM) 300 MG tablet TAKE 1 TABLET BY MOUTH EVERY DAY 90 tablet 1   amLODipine (NORVASC) 5 MG tablet TAKE 1 TABLET BY MOUTH EVERY DAY 90 tablet 2   metFORMIN (GLUCOPHAGE) 500 MG tablet TAKE 1 TABLET BY MOUTH TWICE A DAY WITH MEALS 180 tablet 1   metoprolol tartrate (LOPRESSOR) 100 MG tablet TAKE 2 TABLETS (200 MG TOTAL) BY MOUTH 2 (TWO) TIMES DAILY. [place]  tablet 1   OZEMPIC, 0.25 OR 0.5 MG/DOSE, 2 MG/1.5ML SOPN INJECT 0.5 MG INTO THE SKIN ONCE A WEEK. 4.5 pen 3   tadalafil (CIALIS) 5 MG tablet TAKE ONE TABLET BY MOUTH DAILY AS NEEDED FOR ERECTILE DYSFUNCTION 30 tablet 11   terbinafine (LAMISIL) 250 MG tablet TAKE 1 TABLET BY MOUTH EVERY DAY 90 tablet 1   testosterone cypionate (DEPOTESTOSTERONE CYPIONATE) 200 MG/ML injection Inject 1 mL (200 mg total) into the muscle every 14 (fourteen) days. 10 mL 0   valsartan-hydrochlorothiazide (DIOVAN-HCT) 320-25 MG tablet TAKE 1 TABLET BY MOUTH EVERY DAY 90 tablet 1   vitamin B-12 (CYANOCOBALAMIN) 1000 MCG tablet Take 1 tablet (1,000 mcg  total) by mouth daily. 30 tablet 11   AMBULATORY NON FORMULARY MEDICATION Single glucometer with lancets, test strips Test daily.  Disp qs x 3 months. E11.49 (Patient not taking: Reported on 02/20/2021) 1 each 4   No current facility-administered medications on file prior to visit.     PAST SURGICAL HISTORY Past Surgical History:  Procedure Laterality Date   COLONOSCOPY     20 yrs ago [place] normal per pt    ELBOW SURGERY Right    x 2   sinus surgey     tumor removed side of face     age 72 or 88    uvuloplasty for OSA      FAMILY HISTORY: Family History  Problem Relation Age of Onset   Cancer Mother        Marena Chancy - thinks it started in her lungs.   Cancer Father        Unsure of type.   Diabetes Maternal Grandfather    Diabetes Maternal Aunt    Hypertension Neg Hx    Colon cancer Neg Hx    Colon polyps Neg Hx    Esophageal cancer Neg Hx    Rectal cancer Neg Hx    Stomach cancer Neg Hx     SOCIAL HISTORY:  reports that he has never smoked. He has never used smokeless tobacco. He reports current alcohol use of about 42.0 standard drinks per week. He reports that he does not use drugs.  PERFORMANCE STATUS: The patient's performance status is 1 - Symptomatic but completely ambulatory  PHYSICAL EXAM: Most Recent Vital Signs: There were no vitals taken for this visit. BP 110/74 (BP Location: Left Arm, Patient Position: Sitting)   Pulse 72   Temp 98 F (36.7 C) (Oral)   Resp 18   Ht _0  (1.956 m)   Wt 245 lb 12.8 oz (111.5 kg)   SpO2 98%   BMI 29.15 kg/m   General Appearance:    Alert, cooperative, no distress, appears stated age  Head:    Normocephalic, without obvious abnormality, atraumatic  Eyes:    PERRL, conjunctiva/corneas clear, EOM's intact, fundi    benign, both eyes             Throat:   Lips, mucosa, and tongue normal; teeth and gums normal  Neck:   Supple, symmetrical, trachea midline, no adenopathy;       thyroid:  No  enlargement/tenderness/nodules; no carotid   bruit or JVD  Back:     Symmetric, no curvature, ROM normal, no CVA tenderness  Lungs:     Clear to auscultation bilaterally, respirations unlabored  Chest wall:    No tenderness or deformity  Heart:    Regular rate and rhythm, S1 and S2 normal, no murmur, rub   or gallop  Abdomen:     Soft, non-tender, bowel sounds active all four quadrants,    no masses, no organomegaly        Extremities:   Extremities normal, atraumatic, no cyanosis or edema  Pulses:   2+ and symmetric all extremities  Skin:   Skin color, texture, turgor normal, no rashes or lesions  Lymph nodes:   Cervical, supraclavicular, and axillary nodes normal  Neurologic:   CNII-XII intact. Normal strength, sensation and reflexes      throughout    LABORATORY DATA:  No results found for this or any previous visit (from the past 48 hour(s)).    RADIOGRAPHY: No results found.     PATHOLOGY: None  ASSESSMENT/PLAN: Jonathan Oliver is a very pleasant 55 yo caucasian gentleman with erythrocytosis noted all the way back to 2011.  He has not donated blood in over a year. He did stop his testosterone supplementation 4 months ago. Counts remain quite high.  JAK 2 and iron labs pending. If negative, we will discuss possible bone marrow biopsy for further evaluation.  We will phlebotomize him today and weekly for the next 4 weeks to get his Hgb/Hct down.  Follow-up in 1 month.  He will start taking a baby aspirin daily with food. He will also increase his fluid intake throughout the day.  He may also benefit from being retested for sleep apnea after breaking his nose.   All questions were answered. The patient knows to call the clinic with any problems, questions or concerns. We can certainly see the patient much sooner if necessary.  Laverna Peace, NP

## 2021-02-20 NOTE — Progress Notes (Signed)
Jonathan Oliver  presents today for phlebotomy per MD orders. Phlebotomy procedure started at 3:33 PM and ended at 3:43 PM. 550 grams removed via 16 gauge needle to right AC.  Per Judson Roch patient does not need to be observed for 30 minutes after procedure. Patient tolerated procedure well and understands to call if he has any questions or concerns post discharge.

## 2021-02-21 LAB — CBC
HCT: 57.8 % — ABNORMAL HIGH (ref 38.5–50.0)
Hemoglobin: 20.1 g/dL — ABNORMAL HIGH (ref 13.2–17.1)
MCH: 31.9 pg (ref 27.0–33.0)
MCHC: 34.8 g/dL (ref 32.0–36.0)
MCV: 91.7 fL (ref 80.0–100.0)
MPV: 12.2 fL (ref 7.5–12.5)
Platelets: 160 10*3/uL (ref 140–400)
RBC: 6.3 10*6/uL — ABNORMAL HIGH (ref 4.20–5.80)
RDW: 16 % — ABNORMAL HIGH (ref 11.0–15.0)
WBC: 6.5 10*3/uL (ref 3.8–10.8)

## 2021-02-21 LAB — ERYTHROPOIETIN: Erythropoietin: 7.8 m[IU]/mL (ref 2.6–18.5)

## 2021-02-21 LAB — FERRITIN: Ferritin: 417 ng/mL — ABNORMAL HIGH (ref 24–336)

## 2021-02-21 LAB — IRON AND TIBC
Iron: 166 ug/dL — ABNORMAL HIGH (ref 42–163)
Saturation Ratios: 49 % (ref 20–55)
TIBC: 335 ug/dL (ref 202–409)
UIBC: 169 ug/dL (ref 117–376)

## 2021-02-21 LAB — VITAMIN B12: Vitamin B-12: 651 pg/mL (ref 200–1100)

## 2021-02-27 ENCOUNTER — Other Ambulatory Visit: Payer: Self-pay | Admitting: Sports Medicine

## 2021-02-27 DIAGNOSIS — I10 Essential (primary) hypertension: Secondary | ICD-10-CM

## 2021-02-27 DIAGNOSIS — E1159 Type 2 diabetes mellitus with other circulatory complications: Secondary | ICD-10-CM

## 2021-02-27 DIAGNOSIS — I152 Hypertension secondary to endocrine disorders: Secondary | ICD-10-CM

## 2021-03-01 LAB — JAK 2 V617F (GENPATH)

## 2021-03-02 ENCOUNTER — Other Ambulatory Visit: Payer: Self-pay | Admitting: Family

## 2021-03-02 ENCOUNTER — Encounter: Payer: Self-pay | Admitting: *Deleted

## 2021-03-05 ENCOUNTER — Other Ambulatory Visit: Payer: Self-pay

## 2021-03-05 ENCOUNTER — Inpatient Hospital Stay: Payer: BC Managed Care – PPO

## 2021-03-05 ENCOUNTER — Inpatient Hospital Stay: Payer: BC Managed Care – PPO | Attending: Hematology & Oncology

## 2021-03-05 NOTE — Progress Notes (Signed)
Jonathan Oliver. presents today for phlebotomy per MD orders. Phlebotomy procedure started at 1458 and ended at 1510. 536 grams removed from rt [facility] using16g phlebotomy kit.  Patient tolerated procedure well. IV needle removed intact. Patient declined 30 minute observation.

## 2021-03-08 LAB — HEMOCHROMATOSIS DNA-PCR(C282Y,H63D)

## 2021-03-12 ENCOUNTER — Other Ambulatory Visit: Payer: Self-pay

## 2021-03-12 ENCOUNTER — Inpatient Hospital Stay: Payer: BC Managed Care – PPO

## 2021-03-12 NOTE — Patient Instructions (Signed)

## 2021-03-12 NOTE — Progress Notes (Signed)
Jonathan Oliver. presents today for phlebotomy per MD orders. Phlebotomy procedure started at 1458 and ended at 1504. 564 grams removed from lt AC using 16g phlebotomy kit. Patient tolerated procedure well. IV needle removed intact. Patient declined post observation.

## 2021-03-16 ENCOUNTER — Telehealth: Payer: Self-pay | Admitting: Family

## 2021-03-16 NOTE — Telephone Encounter (Signed)
Left message with call back number to go over recent Hemochromatosis DNA results.

## 2021-03-19 ENCOUNTER — Inpatient Hospital Stay: Payer: BC Managed Care – PPO

## 2021-03-19 ENCOUNTER — Other Ambulatory Visit: Payer: Self-pay

## 2021-03-19 NOTE — Progress Notes (Signed)
Jonathan Oliver presents today for phlebotomy per MD orders. Phlebotomy procedure started at 1455 and ended at 1505  480 grams removed via 18 gauge needle to left AC.  Patient observed for 15 minutes after procedure without any incident.denies waiting full 30 minutes.  Patient tolerated procedure well and received replacement fluids after procedure.  Patient understands to call if he has any questions or concerns post discharge.

## 2021-03-19 NOTE — Patient Instructions (Signed)

## 2021-03-26 ENCOUNTER — Other Ambulatory Visit: Payer: BC Managed Care – PPO

## 2021-03-26 ENCOUNTER — Ambulatory Visit: Payer: BC Managed Care – PPO | Admitting: Family

## 2021-04-03 ENCOUNTER — Inpatient Hospital Stay: Payer: BC Managed Care – PPO

## 2021-04-03 ENCOUNTER — Inpatient Hospital Stay (HOSPITAL_BASED_OUTPATIENT_CLINIC_OR_DEPARTMENT_OTHER): Payer: BC Managed Care – PPO | Admitting: Family

## 2021-04-03 ENCOUNTER — Encounter: Payer: Self-pay | Admitting: Family

## 2021-04-03 ENCOUNTER — Other Ambulatory Visit: Payer: Self-pay

## 2021-04-03 ENCOUNTER — Inpatient Hospital Stay: Payer: BC Managed Care – PPO | Attending: Hematology & Oncology

## 2021-04-03 ENCOUNTER — Other Ambulatory Visit: Payer: Self-pay | Admitting: Family

## 2021-04-03 DIAGNOSIS — D751 Secondary polycythemia: Secondary | ICD-10-CM

## 2021-04-03 LAB — CMP (CANCER CENTER ONLY)
ALT: 32 U/L (ref 0–44)
AST: 26 U/L (ref 15–41)
Albumin: 3.9 g/dL (ref 3.5–5.0)
Alkaline Phosphatase: 69 U/L (ref 38–126)
Anion gap: 8 (ref 5–15)
BUN: 17 mg/dL (ref 6–20)
CO2: 27 mmol/L (ref 22–32)
Calcium: 9.7 mg/dL (ref 8.9–10.3)
Chloride: 104 mmol/L (ref 98–111)
Creatinine: 1.3 mg/dL — ABNORMAL HIGH (ref 0.61–1.24)
GFR, Estimated: >60 mL/min (ref >60)
Glucose, Bld: 117 mg/dL — ABNORMAL HIGH (ref 70–99)
Potassium: 4 mmol/L (ref 3.5–5.1)
Sodium: 139 mmol/L (ref 135–145)
Total Bilirubin: 0.9 mg/dL (ref 0.3–1.2)
Total Protein: 6.3 g/dL — ABNORMAL LOW (ref 6.5–8.1)

## 2021-04-03 LAB — CBC WITH DIFFERENTIAL (CANCER CENTER ONLY)
Abs Immature Granulocytes: 0.04 10*3/uL (ref 0.00–0.07)
Basophils Absolute: 0.1 10*3/uL (ref 0.0–0.1)
Basophils Relative: 1 %
Eosinophils Absolute: 0.3 10*3/uL (ref 0.0–0.5)
Eosinophils Relative: 4 %
HCT: 50.6 % (ref 39.0–52.0)
Hemoglobin: 17.5 g/dL — ABNORMAL HIGH (ref 13.0–17.0)
Immature Granulocytes: 1 %
Lymphocytes Relative: 16 %
Lymphs Abs: 1.1 10*3/uL (ref 0.7–4.0)
MCH: 33.9 pg (ref 26.0–34.0)
MCHC: 34.6 g/dL (ref 30.0–36.0)
MCV: 98.1 fL (ref 80.0–100.0)
Monocytes Absolute: 0.8 10*3/uL (ref 0.1–1.0)
Monocytes Relative: 11 %
Neutro Abs: 4.9 10*3/uL (ref 1.7–7.7)
Neutrophils Relative %: 67 %
Platelet Count: 154 10*3/uL (ref 150–400)
RBC: 5.16 MIL/uL (ref 4.22–5.81)
RDW: 14.4 % (ref 11.5–15.5)
WBC Count: 7.3 10*3/uL (ref 4.0–10.5)
nRBC: 0 % (ref 0.0–0.2)

## 2021-04-03 NOTE — Progress Notes (Signed)
Hematology and Oncology Follow Up Visit  Jonathan Oliver [id number] 1966/05/07 55 y.o. 04/03/2021   Principle Diagnosis:  Hemochromatosis, heterozygous for the H63D mutation  Current Therapy:   Phlebotomy to maintain iron saturation less than 50% and ferritin less than 100   Interim History:  Jonathan Oliver is here today for follow-up. Hgb is 17.5, Hct 50.6%. He is doing well and has no complaints at this time.  JAK 2 testing was negative however he was heterozygous for the H63D mutation with hemochromatosis.  Iron saturation in August was 49% and ferritin 417. He has had several phlebotomies. Today's iron studies are pending.  No episodes bleeding. No abnormal bruising, no petechiae.  No fever, chills, n/v, cough, rash, dizziness, SOB, chest pain, palpitations, abdominal pain or changes in bowel or bladder habits.  No swelling or tenderness in his extremities.  Neuropathy in his feet is unchanged.  No falls or syncope.  He has maintained a good appetite and is staying well hydrated. His weight is stable at 249 lbs.   ECOG Performance Status: 1 - Symptomatic but completely ambulatory  Medications:  Allergies as of 04/03/2021   No Known Allergies      Medication List        Accurate as of April 03, 2021  3:14 PM. If you have any questions, ask your nurse or doctor.          allopurinol 300 MG tablet Commonly known as: ZYLOPRIM TAKE 1 TABLET BY MOUTH EVERY DAY   AMBULATORY NON FORMULARY MEDICATION Single glucometer with lancets, test strips Test daily.  Disp qs x 3 months. E11.49   amLODipine 5 MG tablet Commonly known as: NORVASC TAKE 1 TABLET BY MOUTH EVERY DAY   metFORMIN 500 MG tablet Commonly known as: GLUCOPHAGE TAKE 1 TABLET BY MOUTH TWICE A DAY WITH MEALS   metoprolol tartrate 100 MG tablet Commonly known as: LOPRESSOR TAKE 2 TABLETS (200 MG TOTAL) BY MOUTH 2 (TWO) TIMES DAILY.   Ozempic (0.25 or 0.5 MG/DOSE) 2 MG/1.5ML Sopn Generic drug:  Semaglutide(0.25 or 0.5MG /DOS) INJECT 0.5 MG INTO THE SKIN ONCE A WEEK.   tadalafil 5 MG tablet Commonly known as: CIALIS TAKE ONE TABLET BY MOUTH DAILY AS NEEDED FOR ERECTILE DYSFUNCTION   terbinafine 250 MG tablet Commonly known as: LAMISIL TAKE 1 TABLET BY MOUTH EVERY DAY   testosterone cypionate 200 MG/ML injection Commonly known as: DEPOTESTOSTERONE CYPIONATE Inject 1 mL (200 mg total) into the muscle every 14 (fourteen) days.   valsartan-hydrochlorothiazide 320-25 MG tablet Commonly known as: DIOVAN-HCT TAKE 1 TABLET BY MOUTH EVERY DAY   vitamin B-12 1000 MCG tablet Commonly known as: CYANOCOBALAMIN Take 1 tablet (1,000 mcg total) by mouth daily.        Allergies: No Known Allergies  Past Medical History, Surgical history, Social history, and Family History were reviewed and updated.  Review of Systems: All other 10 point review of systems is negative.   Physical Exam:  weight is 249 lb 12.8 oz (113.3 kg). His oral temperature is 98 F (36.7 C). His blood pressure is 117/93 (abnormal) and his pulse is 70. His respiration is 17 and oxygen saturation is 98%.   Wt Readings from Last 3 Encounters:  04/03/21 249 lb 12.8 oz (113.3 kg)  02/20/21 245 lb 12.8 oz (111.5 kg)  01/12/21 248 lb (112.5 kg)    Ocular: Sclerae unicteric, pupils equal, round and reactive to light Ear-nose-throat: Oropharynx clear, dentition fair Lymphatic: No cervical or supraclavicular adenopathy Lungs no rales  or rhonchi, good excursion bilaterally Heart regular rate and rhythm, no murmur appreciated Abd soft, nontender, positive bowel sounds MSK no focal spinal tenderness, no joint edema Neuro: non-focal, well-oriented, appropriate affect Breasts: Deferred   Lab Results  Component Value Date   WBC 7.3 04/03/2021   HGB 17.5 (H) 04/03/2021   HCT 50.6 04/03/2021   MCV 98.1 04/03/2021   PLT 154 04/03/2021   Lab Results  Component Value Date   FERRITIN 417 (H) 02/20/2021   IRON 166  (H) 02/20/2021   TIBC 335 02/20/2021   UIBC 169 02/20/2021   IRONPCTSAT 49 02/20/2021   Lab Results  Component Value Date   RETICCTPCT 2.0 02/20/2021   RBC 5.16 04/03/2021   No results found for: KPAFRELGTCHN, LAMBDASER, KAPLAMBRATIO No results found for: IGGSERUM, IGA, IGMSERUM No results found for: Odetta Pink, SPEI   Chemistry      Component Value Date/Time   NA 139 02/20/2021 1430   NA 142 06/18/2017 1425   K 4.4 02/20/2021 1430   K 4.1 06/18/2017 1425   CL 102 02/20/2021 1430   CL 103 06/18/2017 1425   CO2 29 02/20/2021 1430   CO2 26 06/18/2017 1425   BUN 32 (H) 02/20/2021 1430   BUN 24 (H) 06/18/2017 1425   CREATININE 1.77 (H) 02/20/2021 1430   CREATININE 1.60 (H) 01/12/2021 0000      Component Value Date/Time   CALCIUM 10.4 (H) 02/20/2021 1430   CALCIUM 10.0 06/18/2017 1425   ALKPHOS 78 02/20/2021 1430   ALKPHOS 95 (H) 06/18/2017 1425   AST 32 02/20/2021 1430   ALT 41 02/20/2021 1430   ALT 63 (H) 06/18/2017 1425   BILITOT 1.1 02/20/2021 1430       Impression and Plan: Jonathan Oliver is a very pleasant 55 yo caucasian gentleman with hemochromatosis, heterozygous for the H63D mutation.  We will proceed with phlebotomy today for Hct 50.6%.  Iron studies are pending. We will get him on a schedule with [facility] if needed.  Lab check monthly, follow-up in 3 months.  He can contact our office with any questions or concerns.   Lottie Dawson, NP 10/11/20223:14 PM

## 2021-04-03 NOTE — Progress Notes (Signed)
Jonathan Oliver. presents today for phlebotomy per MD orders. Phlebotomy procedure started at 1540 and ended at 1548 . 550 grams removed. Patient observed for 15 minutes after procedure without any incident. Pt unable to stay full 30 minute observation period. Patient tolerated procedure well. IV needle removed intact.

## 2021-04-03 NOTE — Patient Instructions (Signed)
Therapeutic Phlebotomy Therapeutic phlebotomy is the planned removal of blood from a person's body for the purpose of treating a medical condition. The procedure is similar to donating blood. Usually, about a pint (470 mL, or 0.47 L) of blood is removed. The average adult has 9-12 pints (4.3-5.7 L) of blood in the body. Therapeutic phlebotomy may be used to treat the following medical conditions: Hemochromatosis. This is a condition in which the blood contains too much iron. Polycythemia vera. This is a condition in which the blood contains too many red blood cells. Porphyria cutanea tarda. This is a disease in which an important part of hemoglobin is not made properly. It results in the buildup of abnormal amounts of porphyrins in the body. Sickle cell disease. This is a condition in which the red blood cells form an abnormal crescent shape rather than a round shape. Tell a health care provider about: Any allergies you have. All medicines you are taking, including vitamins, herbs, eye drops, creams, and over-the-counter medicines. Any problems you or family members have had with anesthetic medicines. Any blood disorders you have. Any surgeries you have had. Any medical conditions you have. Whether you are pregnant or may be pregnant. What are the risks? Generally, this is a safe procedure. However, problems may occur, including: Nausea or light-headedness. Low blood pressure (hypotension). Soreness, bleeding, swelling, or bruising at the needle insertion site. Infection. What happens before the procedure? Follow instructions from your health care provider about eating or drinking restrictions. Ask your health care provider about: Changing or stopping your regular medicines. This is especially important if you are taking diabetes medicines or blood thinners (anticoagulants). Taking medicines such as aspirin and ibuprofen. These medicines can thin your blood. Do not take these medicines  unless your health care provider tells you to take them. Taking over-the-counter medicines, vitamins, herbs, and supplements. Wear clothing with sleeves that can be raised above the elbow. Plan to have someone take you home from the hospital or clinic. You may have a blood sample taken. Your blood pressure, pulse rate, and breathing rate will be measured. What happens during the procedure?  To lower your risk of infection: Your health care team will wash or sanitize their hands. Your skin will be cleaned with an antiseptic. You may be given a medicine to numb the area (local anesthetic). A tourniquet will be placed on your arm. A needle will be inserted into one of your veins. Tubing and a collection bag will be attached to that needle. Blood will flow through the needle and tubing into the collection bag. The collection bag will be placed lower than your arm to allow gravity to help the flow of blood into the bag. You may be asked to open and close your hand slowly and continually during the entire collection. After the specified amount of blood has been removed from your body, the collection bag and tubing will be clamped. The needle will be removed from your vein. Pressure will be held on the site of the needle insertion to stop the bleeding. A bandage (dressing) will be placed over the needle insertion site. The procedure may vary among health care providers and hospitals. What happens after the procedure? Your blood pressure, pulse rate, and breathing rate will be measured after the procedure. You will be encouraged to drink fluids. Your recovery will be assessed and monitored. You can return to your normal activities as told by your health care provider. Summary Therapeutic phlebotomy is the planned removal   of blood from a person's body for the purpose of treating a medical condition. Therapeutic phlebotomy may be used to treat hemochromatosis, polycythemia vera, porphyria cutanea  tarda, or sickle cell disease. In the procedure, a needle is inserted and about a pint (470 mL, or 0.47 L) of blood is removed. The average adult has 9-12 pints (4.3-5.7 L) of blood in the body. This is generally a safe procedure, but it can sometimes cause problems such as nausea, light-headedness, or low blood pressure (hypotension). This information is not intended to replace advice given to you by your health care provider. Make sure you discuss any questions you have with your health care provider. Document Revised: 09/19/2020 Document Reviewed: 06/26/2017 Elsevier Patient Education  2022 Elsevier Inc.  

## 2021-04-04 ENCOUNTER — Telehealth: Payer: Self-pay

## 2021-04-04 ENCOUNTER — Encounter: Payer: Self-pay | Admitting: Hematology & Oncology

## 2021-04-04 ENCOUNTER — Telehealth: Payer: Self-pay | Admitting: *Deleted

## 2021-04-04 LAB — FERRITIN: Ferritin: 82 ng/mL (ref 24–336)

## 2021-04-04 LAB — IRON AND TIBC
Iron: 157 ug/dL (ref 45–182)
Saturation Ratios: 38 % (ref 17.9–39.5)
TIBC: 408 ug/dL (ref 250–450)
UIBC: 251 ug/dL

## 2021-04-04 NOTE — Telephone Encounter (Signed)
Per 04/04/21 los - called and lvm of upcoming appointments - requested callback to confirm

## 2021-04-04 NOTE — Telephone Encounter (Signed)
-----   Message from Celso Amy, NP sent at 04/04/2021  1:56 PM EDT ----- Iron studies are looking good!!! WOO HOO!!!! Job well done :) No extra phlebotomy needed at this time. We will now check labs monthly and send him to [facility] if needed and follow-up in 3 months. Scheduling message sent! Thank you!!  Judson Roch  ----- Message ----- From: Buel Ream, Lab [facility] [place] Sent: 04/03/2021   2:50 PM EDT To: Celso Amy, NP

## 2021-04-09 ENCOUNTER — Other Ambulatory Visit: Payer: Self-pay | Admitting: Sports Medicine

## 2021-04-09 DIAGNOSIS — E1149 Type 2 diabetes mellitus with other diabetic neurological complication: Secondary | ICD-10-CM

## 2021-05-15 ENCOUNTER — Other Ambulatory Visit: Payer: Self-pay | Admitting: Sports Medicine

## 2021-05-15 DIAGNOSIS — E349 Endocrine disorder, unspecified: Secondary | ICD-10-CM

## 2021-05-15 NOTE — Telephone Encounter (Signed)
Patient aware prescription was sent in as requested.

## 2021-05-15 NOTE — Telephone Encounter (Signed)
Last ov 12/2020 Last labs 12/2020

## 2021-07-04 ENCOUNTER — Inpatient Hospital Stay: Payer: BC Managed Care – PPO | Admitting: Family

## 2021-07-04 ENCOUNTER — Inpatient Hospital Stay: Payer: BC Managed Care – PPO | Attending: Hematology & Oncology

## 2021-07-04 ENCOUNTER — Other Ambulatory Visit: Payer: Self-pay

## 2021-07-04 ENCOUNTER — Inpatient Hospital Stay: Payer: BC Managed Care – PPO

## 2021-07-04 ENCOUNTER — Encounter: Payer: Self-pay | Admitting: Family

## 2021-07-04 DIAGNOSIS — D751 Secondary polycythemia: Secondary | ICD-10-CM | POA: Diagnosis not present

## 2021-07-04 LAB — CMP (CANCER CENTER ONLY)
ALT: 34 U/L (ref 0–44)
AST: 26 U/L (ref 15–41)
Albumin: 4 g/dL (ref 3.5–5.0)
Alkaline Phosphatase: 84 U/L (ref 38–126)
Anion gap: 9 (ref 5–15)
BUN: 25 mg/dL — ABNORMAL HIGH (ref 6–20)
CO2: 24 mmol/L (ref 22–32)
Calcium: 10.3 mg/dL (ref 8.9–10.3)
Chloride: 104 mmol/L (ref 98–111)
Creatinine: 1.29 mg/dL — ABNORMAL HIGH (ref 0.61–1.24)
GFR, Estimated: >60 mL/min (ref >60)
Glucose, Bld: 100 mg/dL — ABNORMAL HIGH (ref 70–99)
Potassium: 4.2 mmol/L (ref 3.5–5.1)
Sodium: 137 mmol/L (ref 135–145)
Total Bilirubin: 0.7 mg/dL (ref 0.3–1.2)
Total Protein: 6.5 g/dL (ref 6.5–8.1)

## 2021-07-04 LAB — CBC WITH DIFFERENTIAL (CANCER CENTER ONLY)
Abs Immature Granulocytes: 0.06 10*3/uL (ref 0.00–0.07)
Basophils Absolute: 0.1 10*3/uL (ref 0.0–0.1)
Basophils Relative: 2 %
Eosinophils Absolute: 0.4 10*3/uL (ref 0.0–0.5)
Eosinophils Relative: 5 %
HCT: 57.8 % — ABNORMAL HIGH (ref 39.0–52.0)
Hemoglobin: 19.5 g/dL — ABNORMAL HIGH (ref 13.0–17.0)
Immature Granulocytes: 1 %
Lymphocytes Relative: 17 %
Lymphs Abs: 1.4 10*3/uL (ref 0.7–4.0)
MCH: 30.6 pg (ref 26.0–34.0)
MCHC: 33.7 g/dL (ref 30.0–36.0)
MCV: 90.7 fL (ref 80.0–100.0)
Monocytes Absolute: 0.9 10*3/uL (ref 0.1–1.0)
Monocytes Relative: 11 %
Neutro Abs: 5.4 10*3/uL (ref 1.7–7.7)
Neutrophils Relative %: 64 %
Platelet Count: 173 10*3/uL (ref 150–400)
RBC: 6.37 MIL/uL — ABNORMAL HIGH (ref 4.22–5.81)
RDW: 13.1 % (ref 11.5–15.5)
WBC Count: 8.3 10*3/uL (ref 4.0–10.5)
nRBC: 0 % (ref 0.0–0.2)

## 2021-07-04 NOTE — Progress Notes (Signed)
Jonathan Oliver. presents today for phlebotomy per MD orders. Phlebotomy procedure started at 1348 and ended at 70. 584 grams removed from rt AC using 16g phlebotomy kit.  Pt declined post observation.  Patient tolerated procedure well. IV needle removed intact.

## 2021-07-04 NOTE — Progress Notes (Signed)
Hematology and Oncology Follow Up Visit  Jonathan Oliver [id number] 28-Apr-1966 56 y.o. 07/04/2021   Principle Diagnosis:  Hemochromatosis, heterozygous for the H63D mutation   Current Therapy:        Phlebotomy to maintain iron saturation less than 50% and ferritin less than 100   Interim History:  Jonathan Oliver is here today for follow-up. We last saw him in October. His Hgb and Hct are now quite high at 19.5 and 57.8%.  We will phlebotomize him and get him back on a weekly schedule. Iron studies are pending.  He has mild fatigue at times. He notes minimal SOB with over exertion.  His complexion is quite ruddy at this time.  No fever, chills, n/v, cough, rash, dizziness, headaches, changes or loss of vision, chest pain, palpitations, abdominal pain or changes in bowel or bladder habits.  No swelling or tenderness in her extremities.  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  No falls or syncope to report.  He is eating well and states that he is staying well hydrated throughout the day. His weight is stable at 251 lbs.   ECOG Performance Status: 1 - Symptomatic but completely ambulatory  Medications:  Allergies as of 07/04/2021   No Known Allergies      Medication List        Accurate as of July 04, 2021  1:22 PM. If you have any questions, ask your nurse or doctor.          allopurinol 300 MG tablet Commonly known as: ZYLOPRIM TAKE 1 TABLET BY MOUTH EVERY DAY   AMBULATORY NON FORMULARY MEDICATION Single glucometer with lancets, test strips Test daily.  Disp qs x 3 months. E11.49   amLODipine 5 MG tablet Commonly known as: NORVASC TAKE 1 TABLET BY MOUTH EVERY DAY   metFORMIN 500 MG tablet Commonly known as: GLUCOPHAGE TAKE 1 TABLET BY MOUTH TWICE A DAY WITH MEALS   metoprolol tartrate 100 MG tablet Commonly known as: LOPRESSOR TAKE 2 TABLETS (200 MG TOTAL) BY MOUTH 2 (TWO) TIMES DAILY.   Ozempic (0.25 or 0.5 MG/DOSE) 2 MG/1.5ML Sopn Generic  drug: Semaglutide(0.25 or 0.5MG /DOS) INJECT 0.5 MG INTO THE SKIN ONCE A WEEK.   tadalafil 5 MG tablet Commonly known as: CIALIS TAKE ONE TABLET BY MOUTH DAILY AS NEEDED FOR ERECTILE DYSFUNCTION   terbinafine 250 MG tablet Commonly known as: LAMISIL TAKE 1 TABLET BY MOUTH EVERY DAY   testosterone cypionate 200 MG/ML injection Commonly known as: DEPOTESTOSTERONE CYPIONATE INJECT 1 ML (200 MG TOTAL) INTO THE MUSCLE EVERY 14 (FOURTEEN) DAYS.   valsartan-hydrochlorothiazide 320-25 MG tablet Commonly known as: DIOVAN-HCT TAKE 1 TABLET BY MOUTH EVERY DAY   vitamin B-12 1000 MCG tablet Commonly known as: CYANOCOBALAMIN Take 1 tablet (1,000 mcg total) by mouth daily.        Allergies: No Known Allergies  Past Medical History, Surgical history, Social history, and Family History were reviewed and updated.  Review of Systems: All other 10 point review of systems is negative.   Physical Exam:  weight is 251 lb 1.9 oz (113.9 kg). His oral temperature is 97.9 F (36.6 C). His blood pressure is 112/80 and his pulse is 75. His respiration is 17 and oxygen saturation is 97%.   Wt Readings from Last 3 Encounters:  07/04/21 251 lb 1.9 oz (113.9 kg)  04/03/21 249 lb 12.8 oz (113.3 kg)  02/20/21 245 lb 12.8 oz (111.5 kg)    Ocular: Sclerae unicteric, pupils equal, round and  reactive to light Ear-nose-throat: Oropharynx clear, dentition fair Lymphatic: No cervical or supraclavicular adenopathy Lungs no rales or rhonchi, good excursion bilaterally Heart regular rate and rhythm, no murmur appreciated Abd soft, nontender, positive bowel sounds MSK no focal spinal tenderness, no joint edema Neuro: non-focal, well-oriented, appropriate affect Breasts: Deferred   Lab Results  Component Value Date   WBC 8.3 07/04/2021   HGB 19.5 (H) 07/04/2021   HCT 57.8 (H) 07/04/2021   MCV 90.7 07/04/2021   PLT 173 07/04/2021   Lab Results  Component Value Date   FERRITIN 82 04/03/2021   IRON  157 04/03/2021   TIBC 408 04/03/2021   UIBC 251 04/03/2021   IRONPCTSAT 38 04/03/2021   Lab Results  Component Value Date   RETICCTPCT 2.0 02/20/2021   RBC 6.37 (H) 07/04/2021   No results found for: KPAFRELGTCHN, LAMBDASER, KAPLAMBRATIO No results found for: IGGSERUM, IGA, IGMSERUM No results found for: Odetta Pink, SPEI   Chemistry      Component Value Date/Time   NA 139 04/03/2021 1441   NA 142 06/18/2017 1425   K 4.0 04/03/2021 1441   K 4.1 06/18/2017 1425   CL 104 04/03/2021 1441   CL 103 06/18/2017 1425   CO2 27 04/03/2021 1441   CO2 26 06/18/2017 1425   BUN 17 04/03/2021 1441   BUN 24 (H) 06/18/2017 1425   CREATININE 1.30 (H) 04/03/2021 1441   CREATININE 1.60 (H) 01/12/2021 0000      Component Value Date/Time   CALCIUM 9.7 04/03/2021 1441   CALCIUM 10.0 06/18/2017 1425   ALKPHOS 69 04/03/2021 1441   ALKPHOS 95 (H) 06/18/2017 1425   AST 26 04/03/2021 1441   ALT 32 04/03/2021 1441   ALT 63 (H) 06/18/2017 1425   BILITOT 0.9 04/03/2021 1441       Impression and Plan: Jonathan Oliver is a very pleasant 55 yo caucasian gentleman with hemochromatosis, heterozygous for the H63D mutation. He was phlebotomized today and will com back weekly for phlebotomy for the next 3 weeks.  Follow-up in 4 weeks.    Lottie Dawson, NP 1/11/20231:23 PM

## 2021-07-04 NOTE — Patient Instructions (Signed)

## 2021-07-05 LAB — IRON AND IRON BINDING CAPACITY (CC-WL,HP ONLY)
Iron: 115 ug/dL (ref 45–182)
Saturation Ratios: 29 % (ref 17.9–39.5)
TIBC: 391 ug/dL (ref 250–450)
UIBC: 276 ug/dL (ref 117–376)

## 2021-07-05 LAB — FERRITIN: Ferritin: 68 ng/mL (ref 24–336)

## 2021-07-06 ENCOUNTER — Telehealth: Payer: Self-pay | Admitting: *Deleted

## 2021-07-06 NOTE — Telephone Encounter (Signed)
Per 07/04/21 los - called and gave upcoming appointments - confirmed

## 2021-07-06 NOTE — Telephone Encounter (Signed)
Per 07/04/21 los - called to schedule phlebotomies and follow-up with Judson Roch - requested callback.

## 2021-07-09 ENCOUNTER — Inpatient Hospital Stay (HOSPITAL_BASED_OUTPATIENT_CLINIC_OR_DEPARTMENT_OTHER): Payer: BC Managed Care – PPO

## 2021-07-09 ENCOUNTER — Other Ambulatory Visit: Payer: Self-pay

## 2021-07-09 NOTE — Progress Notes (Signed)
Jonathan Oliver. presents today for phlebotomy per MD orders. Phlebotomy procedure started at 1556 and ended at 1605. 575 grams removed. Patient tolerated procedure well. IV needle removed intact. Pt refused to stay for 30 minute observation.

## 2021-07-09 NOTE — Progress Notes (Signed)
Manual BP 128/96 at time of discharge. Pt is without complaints.  Vale Haven NP notified and order received for pt to be discharged to home and for pt to contact his PCP regarding elevated BP.  Teach back done.

## 2021-07-16 ENCOUNTER — Inpatient Hospital Stay: Payer: BC Managed Care – PPO

## 2021-07-16 ENCOUNTER — Other Ambulatory Visit: Payer: Self-pay

## 2021-07-16 VITALS — BP 116/68 | HR 76 | Temp 97.6°F | Resp 18

## 2021-07-16 DIAGNOSIS — D751 Secondary polycythemia: Secondary | ICD-10-CM

## 2021-07-16 NOTE — Progress Notes (Signed)
Jonathan Oliver. presents today for phlebotomy per MD orders. Phlebotomy procedure started at 15:55 and ended at 16:10. 525 grams removed from Right AC using 16g phlebotomy kit.  Patient tolerated procedure well. Patient stayed for observation and had nourishments. IV needle removed intact.

## 2021-07-16 NOTE — Patient Instructions (Signed)
Therapeutic Phlebotomy °Therapeutic phlebotomy is the planned removal of blood from a person's body for the purpose of treating a medical condition. The procedure is lot like donating blood. Usually, about a pint (470 mL, or 0.47 L) of blood is removed. The average adult has 9-12 pints (4.3-5.7 L) of blood in his or her body. °Therapeutic phlebotomy may be used to treat the following medical conditions: °Hemochromatosis. This is a condition in which the blood contains too much iron. °Polycythemia vera. This is a condition in which the blood contains too many red blood cells. °Porphyria cutanea tarda. This is a disease in which an important part of hemoglobin is not made properly. It results in the buildup of abnormal amounts of porphyrins in the body. °Sickle cell disease. This is a condition in which the red blood cells form an abnormal crescent shape rather than a round shape. °Tell a health care provider about: °Any allergies you have. °All medicines you are taking, including vitamins, herbs, eye drops, creams, and over-the-counter medicines. °Any bleeding problems you have. °Any surgeries you have had. °Any medical conditions you have. °Whether you are pregnant or may be pregnant. °What are the risks? °Generally, this is a safe procedure. However, problems may occur, including: °Nausea or light-headedness. °Low blood pressure (hypotension). °Soreness, bleeding, swelling, or bruising at the needle insertion site. °Infection. °What happens before the procedure? °Ask your health care provider about: °Changing or stopping your regular medicines. This is especially important if you are taking diabetes medicines or blood thinners. °Taking medicines such as aspirin and ibuprofen. These medicines can thin your blood. Do not take these medicines unless your health care provider tells you to take them. °Taking over-the-counter medicines, vitamins, herbs, and supplements. °Wear clothing with sleeves that can be raised  above the elbow. °You may have a blood sample taken. °Your blood pressure, pulse rate, and breathing rate will be measured. °What happens during the procedure? ° °You may be given a medicine to numb the area (local anesthetic). °A tourniquet will be placed on your arm. °A needle will be put into one of your veins. °Tubing and a collection bag will be attached to the needle. °Blood will flow through the needle and tubing into the collection bag. °The collection bag will be placed lower than your arm so gravity can help the blood flow into the bag. °You may be asked to open and close your hand slowly and continually during the entire collection. °After the specified amount of blood has been removed from your body, the collection bag and tubing will be clamped. °The needle will be removed from your vein. °Pressure will be held on the needle site to stop the bleeding. °A bandage (dressing) will be placed over the needle insertion site. °The procedure may vary among health care providers and hospitals. °What happens after the procedure? °Your blood pressure, pulse rate, and breathing rate will be measured after the procedure. °You will be encouraged to drink fluids. °You will be encouraged to eat a snack to prevent a low blood sugar level. °Your recovery will be assessed and monitored. °Return to your normal activities as told by your health care provider. °Summary °Therapeutic phlebotomy is the planned removal of blood from a person's body for the purpose of treating a medical condition. °Therapeutic phlebotomy may be used to treat hemochromatosis, polycythemia vera, porphyria cutanea tarda, or sickle cell disease. °In the procedure, a needle is inserted and about a pint (470 mL, or 0.47 L) of blood is   removed. The average adult has 9-12 pints (4.3-5.7 L) of blood in the body. °This is generally a safe procedure, but it can sometimes cause problems such as nausea, light-headedness, or low blood pressure  (hypotension). °This information is not intended to replace advice given to you by your health care provider. Make sure you discuss any questions you have with your health care provider. °Document Revised: 12/06/2020 Document Reviewed: 12/06/2020 °Elsevier Patient Education © 2022 [employer] Inc. ° °

## 2021-07-23 ENCOUNTER — Ambulatory Visit (INDEPENDENT_AMBULATORY_CARE_PROVIDER_SITE_OTHER): Payer: BC Managed Care – PPO | Admitting: Sports Medicine

## 2021-07-23 ENCOUNTER — Other Ambulatory Visit: Payer: Self-pay

## 2021-07-23 VITALS — BP 113/75 | HR 69 | Wt 251.0 lb

## 2021-07-23 DIAGNOSIS — E1149 Type 2 diabetes mellitus with other diabetic neurological complication: Secondary | ICD-10-CM | POA: Diagnosis not present

## 2021-07-23 DIAGNOSIS — N5201 Erectile dysfunction due to arterial insufficiency: Secondary | ICD-10-CM | POA: Diagnosis not present

## 2021-07-23 DIAGNOSIS — I10 Essential (primary) hypertension: Secondary | ICD-10-CM | POA: Diagnosis not present

## 2021-07-23 DIAGNOSIS — Z23 Encounter for immunization: Secondary | ICD-10-CM | POA: Diagnosis not present

## 2021-07-23 DIAGNOSIS — Z0000 Encounter for general adult medical examination without abnormal findings: Secondary | ICD-10-CM

## 2021-07-23 DIAGNOSIS — E1159 Type 2 diabetes mellitus with other circulatory complications: Secondary | ICD-10-CM

## 2021-07-23 DIAGNOSIS — I152 Hypertension secondary to endocrine disorders: Secondary | ICD-10-CM

## 2021-07-23 LAB — POCT GLYCOSYLATED HEMOGLOBIN (HGB A1C): Hemoglobin A1C: 5.5 % (ref 4.0–5.6)

## 2021-07-23 MED ORDER — AMBULATORY NON FORMULARY MEDICATION: 0 refills | Status: AC

## 2021-07-23 MED ORDER — ADULT BLOOD PRESSURE CUFF LG KIT: PACK | 0 refills | Status: AC

## 2021-07-23 NOTE — Assessment & Plan Note (Addendum)
Well-controlled on current medications, he does endorse sensations of dizziness when taking his medication. I am going to decrease his metoprolol to 100 mg p.o. twice daily., he will get a blood pressure cuff and a glucometer and check his blood pressure and blood sugar if this occurs again.

## 2021-07-23 NOTE — Assessment & Plan Note (Addendum)
Jonathan Oliver does have some erectile dysfunction, historically very well controlled on tadalafil 5 mg daily, he is starting to have worsening efficacy, with erections now only getting about halfway. On days that he is going to have relations he can do Cialis 2 tabs daily. Of also recommended that he get a penile compressive ring and wear no more than 30 minutes to 1 hour before removing. In addition I am going to decrease his metoprolol to 100 mg p.o. twice daily. If the problem is persistent we would probably need to get him off of or decrease his hydrochlorothiazide as well which can result in erectile dysfunction.

## 2021-07-23 NOTE — Progress Notes (Signed)
° ° °  Procedures performed today:    None.  Independent interpretation of notes and tests performed by another provider:   None.  Brief History, Exam, Impression, and Recommendations:    Annual physical exam Jonathan Oliver is doing well, getting him caught up on preventive measures including Tdap and Shingrix, return in 2 to 6 months for Shingrix #2.  Type 2 diabetes mellitus with neurological complications (HCC) Jonathan Oliver is very well controlled at 5.5% down from 6% 6 months ago. He would like a prescription for a glucometer and a blood pressure cuff I think this is acceptable. Considering his improvements I think we can also discontinue his metformin. Recheck in 6 months.  Hypertension associated with diabetes (Jonathan Oliver) Well-controlled on current medications, he does endorse sensations of dizziness when taking his medication. I am going to decrease his metoprolol to 100 mg p.o. twice daily., he will get a blood pressure cuff and a glucometer and check his blood pressure and blood sugar if this occurs again.  Erectile dysfunction due to arterial insufficiency Jonathan Oliver does have some erectile dysfunction, historically very well controlled on tadalafil 5 mg daily, he is starting to have worsening efficacy, with erections now only getting about halfway. On days that he is going to have relations he can do Cialis 2 tabs daily. Of also recommended that he get a penile compressive ring and wear no more than 30 minutes to 1 hour before removing. In addition I am going to decrease his metoprolol to 100 mg p.o. twice daily. If the problem is persistent we would probably need to get him off of or decrease his hydrochlorothiazide as well which can result in erectile dysfunction.    ___________________________________________ Gwen Her. Dianah Field, M.D., ABFM., CAQSM. Primary Care and [place] Instructor of [place] of [facility]  of Medicine

## 2021-07-23 NOTE — Assessment & Plan Note (Signed)
Jonathan Oliver is doing well, getting him caught up on preventive measures including Tdap and Shingrix, return in 2 to 6 months for Shingrix #2.

## 2021-07-23 NOTE — Assessment & Plan Note (Signed)
A1c is very well controlled at 5.5% down from 6% 6 months ago. He would like a prescription for a glucometer and a blood pressure cuff I think this is acceptable. Considering his improvements I think we can also discontinue his metformin. Recheck in 6 months.

## 2021-07-30 ENCOUNTER — Inpatient Hospital Stay: Payer: BC Managed Care – PPO

## 2021-07-30 ENCOUNTER — Encounter: Payer: Self-pay | Admitting: Family

## 2021-07-30 ENCOUNTER — Other Ambulatory Visit: Payer: Self-pay

## 2021-07-30 ENCOUNTER — Inpatient Hospital Stay: Payer: BC Managed Care – PPO | Admitting: Family

## 2021-07-30 ENCOUNTER — Inpatient Hospital Stay: Payer: BC Managed Care – PPO | Attending: Hematology & Oncology

## 2021-07-30 VITALS — BP 109/74 | HR 51 | Temp 98.1°F | Resp 18 | Ht 77.0 in | Wt 248.0 lb

## 2021-07-30 DIAGNOSIS — G4730 Sleep apnea, unspecified: Secondary | ICD-10-CM | POA: Diagnosis not present

## 2021-07-30 DIAGNOSIS — D751 Secondary polycythemia: Secondary | ICD-10-CM

## 2021-07-30 LAB — CMP (CANCER CENTER ONLY)
ALT: 32 U/L (ref 0–44)
AST: 27 U/L (ref 15–41)
Albumin: 3.9 g/dL (ref 3.5–5.0)
Alkaline Phosphatase: 76 U/L (ref 38–126)
Anion gap: 8 (ref 5–15)
BUN: 28 mg/dL — ABNORMAL HIGH (ref 6–20)
CO2: 26 mmol/L (ref 22–32)
Calcium: 10 mg/dL (ref 8.9–10.3)
Chloride: 102 mmol/L (ref 98–111)
Creatinine: 1.47 mg/dL — ABNORMAL HIGH (ref 0.61–1.24)
GFR, Estimated: 56 mL/min — ABNORMAL LOW (ref >60)
Glucose, Bld: 108 mg/dL — ABNORMAL HIGH (ref 70–99)
Potassium: 4.4 mmol/L (ref 3.5–5.1)
Sodium: 136 mmol/L (ref 135–145)
Total Bilirubin: 0.8 mg/dL (ref 0.3–1.2)
Total Protein: 6.7 g/dL (ref 6.5–8.1)

## 2021-07-30 LAB — CBC WITH DIFFERENTIAL (CANCER CENTER ONLY)
Abs Immature Granulocytes: 0.06 10*3/uL (ref 0.00–0.07)
Basophils Absolute: 0.2 10*3/uL — ABNORMAL HIGH (ref 0.0–0.1)
Basophils Relative: 2 %
Eosinophils Absolute: 0.5 10*3/uL (ref 0.0–0.5)
Eosinophils Relative: 5 %
HCT: 50.7 % (ref 39.0–52.0)
Hemoglobin: 17.2 g/dL — ABNORMAL HIGH (ref 13.0–17.0)
Immature Granulocytes: 1 %
Lymphocytes Relative: 21 %
Lymphs Abs: 1.7 10*3/uL (ref 0.7–4.0)
MCH: 31 pg (ref 26.0–34.0)
MCHC: 33.9 g/dL (ref 30.0–36.0)
MCV: 91.5 fL (ref 80.0–100.0)
Monocytes Absolute: 0.9 10*3/uL (ref 0.1–1.0)
Monocytes Relative: 11 %
Neutro Abs: 5 10*3/uL (ref 1.7–7.7)
Neutrophils Relative %: 60 %
Platelet Count: 200 10*3/uL (ref 150–400)
RBC: 5.54 MIL/uL (ref 4.22–5.81)
RDW: 14.5 % (ref 11.5–15.5)
WBC Count: 8.3 10*3/uL (ref 4.0–10.5)
nRBC: 0 % (ref 0.0–0.2)

## 2021-07-30 NOTE — Progress Notes (Signed)
Hematology and Oncology Follow Up Visit  Jonathan Oliver [id number] [birth year] 56 y.o. 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 He notes that his energy has improved with the 3 phlebotomies he has had.  Hgb is now 17.2.  At his visit in January, ferritin was 68 and iron saturation 29%.  He was JAK 2 negative.  He states that after having his sleep apnea surgery, he broke his nose. His wife has told him that he does snore.  Complexion is less ruddy at this time.  No swelling, tenderness, numbness or tingling in his extremities.  No falls or syncope.  He has maintained a good appetite and is staying well hydrated. His weight is stable at 248 lbs.  No fever, chills, n/v, cough, rash, dizziness, SOB, chest pain, palpitations, abdominal pain or changes in bowel or bladder habits.  He notes GERD that resolves with taking 2 TUMS.  No blood loss noted. No bruising or petechiae.   ECOG Performance Status: 1 - Symptomatic but completely ambulatory  Medications:  Allergies as of 07/30/2021   No Known Allergies      Medication List        Accurate as of July 30, 2021  3:18 PM. If you have any questions, ask your nurse or doctor.          Adult Blood Pressure Cuff Lg Kit Use as needed   allopurinol 300 MG tablet Commonly known as: ZYLOPRIM TAKE 1 TABLET BY MOUTH EVERY DAY   AMBULATORY NON FORMULARY MEDICATION Single glucometer with lancets, test strips Test daily.  Disp qs x 3 months. E11.49   AMBULATORY NON FORMULARY MEDICATION Single glucometer with lancets, test strips, patient to test 1-4 times daily.   amLODipine 5 MG tablet Commonly known as: NORVASC TAKE 1 TABLET BY MOUTH EVERY DAY   metoprolol tartrate 100 MG tablet Commonly known  as: LOPRESSOR Take 1 tablet (100 mg total) by mouth.   Ozempic (0.25 or 0.5 MG/DOSE) 2 MG/1.5ML Sopn Generic drug: Semaglutide(0.25 or 0.5MG/DOS) INJECT 0.5 MG INTO THE SKIN ONCE A WEEK.   tadalafil 5 MG tablet Commonly known as: CIALIS TAKE ONE TABLET BY MOUTH DAILY AS NEEDED FOR ERECTILE DYSFUNCTION   testosterone cypionate 200 MG/ML injection Commonly known as: DEPOTESTOSTERONE CYPIONATE INJECT 1 ML (200 MG TOTAL) INTO THE MUSCLE EVERY 14 (FOURTEEN) DAYS.   valsartan-hydrochlorothiazide 320-25 MG tablet Commonly known as: DIOVAN-HCT TAKE 1 TABLET BY MOUTH EVERY DAY   vitamin B-12 1000 MCG tablet Commonly known as: CYANOCOBALAMIN Take 1 tablet (1,000 mcg total) by mouth daily.        Allergies: No Known Allergies  Past Medical History, Surgical history, Social history, and Family History were reviewed and updated.  Review of Systems: All other 10 point review of systems is negative.   Physical Exam:  height is _0  (1.956 m) and weight is 248 lb (112.5 kg). His oral temperature is 98.1 F (36.7 C). His blood pressure is 109/74 and his pulse is 51 (abnormal). His respiration is 18 and oxygen saturation is 96%.   Wt Readings from Last 3 Encounters:  07/30/21 248 lb (112.5 kg)  07/23/21 251 lb 0.6 oz (113.9 kg)  07/04/21 251 lb 1.9 oz (113.9 kg)  Ocular: Sclerae unicteric, pupils equal, round and reactive to light Ear-nose-throat: Oropharynx clear, dentition fair Lymphatic: No cervical or supraclavicular adenopathy Lungs no rales or rhonchi, good excursion bilaterally Heart regular rate and rhythm, no murmur appreciated Abd soft, nontender, positive bowel sounds MSK no focal spinal tenderness, no joint edema Neuro: non-focal, well-oriented, appropriate affect Breasts: Deferred   Lab Results  Component Value Date   WBC 8.3 07/30/2021   HGB 17.2 (H) 07/30/2021   HCT 50.7 07/30/2021   MCV 91.5 07/30/2021   PLT 200 07/30/2021   Lab Results  Component Value  Date   FERRITIN 68 07/04/2021   IRON 115 07/04/2021   TIBC 391 07/04/2021   UIBC 276 07/04/2021   IRONPCTSAT 29 07/04/2021   Lab Results  Component Value Date   RETICCTPCT 2.0 02/20/2021   RBC 5.54 07/30/2021   No results found for: KPAFRELGTCHN, LAMBDASER, KAPLAMBRATIO No results found for: IGGSERUM, IGA, IGMSERUM No results found for: Odetta Pink, SPEI   Chemistry      Component Value Date/Time   NA 136 07/30/2021 1439   NA 142 06/18/2017 1425   K 4.4 07/30/2021 1439   K 4.1 06/18/2017 1425   CL 102 07/30/2021 1439   CL 103 06/18/2017 1425   CO2 26 07/30/2021 1439   CO2 26 06/18/2017 1425   BUN 28 (H) 07/30/2021 1439   BUN 24 (H) 06/18/2017 1425   CREATININE 1.47 (H) 07/30/2021 1439   CREATININE 1.60 (H) 01/12/2021 0000      Component Value Date/Time   CALCIUM 10.0 07/30/2021 1439   CALCIUM 10.0 06/18/2017 1425   ALKPHOS 76 07/30/2021 1439   ALKPHOS 95 (H) 06/18/2017 1425   AST 27 07/30/2021 1439   ALT 32 07/30/2021 1439   ALT 63 (H) 06/18/2017 1425   BILITOT 0.8 07/30/2021 1439       Impression and Plan: Jonathan Oliver is a very pleasant 56 yo caucasian gentleman with hemochromatosis, heterozygous for the H63D mutation. No phlebotomy today. We will wait to see how his iron studies look.  We do recommend he be retested for sleep apnea as this could be influencing his persistent erythrocytosis despite his iron studies being controlled.  Lab check monthly and follow-up in 2 months.   Lottie Dawson, NP 2/6/20233:18 PM

## 2021-07-31 LAB — IRON AND IRON BINDING CAPACITY (CC-WL,HP ONLY)
Iron: 71 ug/dL (ref 45–182)
Saturation Ratios: 16 % — ABNORMAL LOW (ref 17.9–39.5)
TIBC: 448 ug/dL (ref 250–450)
UIBC: 377 ug/dL — ABNORMAL HIGH (ref 117–376)

## 2021-07-31 LAB — FERRITIN: Ferritin: 28 ng/mL (ref 24–336)

## 2021-08-01 ENCOUNTER — Encounter: Payer: Self-pay | Admitting: Sports Medicine

## 2021-08-01 DIAGNOSIS — G4719 Other hypersomnia: Secondary | ICD-10-CM

## 2021-08-02 DIAGNOSIS — G4719 Other hypersomnia: Secondary | ICD-10-CM

## 2021-08-02 HISTORY — DX: Other hypersomnia: G47.19

## 2021-08-02 NOTE — Assessment & Plan Note (Signed)
Ordering sleep study through [facility], home polysomnography

## 2021-08-11 ENCOUNTER — Other Ambulatory Visit: Payer: Self-pay | Admitting: Sports Medicine

## 2021-08-11 DIAGNOSIS — E1159 Type 2 diabetes mellitus with other circulatory complications: Secondary | ICD-10-CM

## 2021-08-11 DIAGNOSIS — I10 Essential (primary) hypertension: Secondary | ICD-10-CM

## 2021-08-16 ENCOUNTER — Other Ambulatory Visit: Payer: Self-pay | Admitting: Sports Medicine

## 2021-08-16 DIAGNOSIS — I10 Essential (primary) hypertension: Secondary | ICD-10-CM

## 2021-08-16 DIAGNOSIS — I152 Hypertension secondary to endocrine disorders: Secondary | ICD-10-CM

## 2021-08-16 DIAGNOSIS — E1159 Type 2 diabetes mellitus with other circulatory complications: Secondary | ICD-10-CM

## 2021-08-27 ENCOUNTER — Inpatient Hospital Stay: Payer: BC Managed Care – PPO | Attending: Hematology & Oncology

## 2021-08-27 ENCOUNTER — Other Ambulatory Visit: Payer: BC Managed Care – PPO

## 2021-08-27 ENCOUNTER — Other Ambulatory Visit: Payer: Self-pay

## 2021-08-27 ENCOUNTER — Encounter: Payer: Self-pay | Admitting: *Deleted

## 2021-08-27 DIAGNOSIS — D751 Secondary polycythemia: Secondary | ICD-10-CM

## 2021-08-27 LAB — CBC WITH DIFFERENTIAL (CANCER CENTER ONLY)
Abs Immature Granulocytes: 0.08 10*3/uL — ABNORMAL HIGH (ref 0.00–0.07)
Basophils Absolute: 0.1 10*3/uL (ref 0.0–0.1)
Basophils Relative: 1 %
Eosinophils Absolute: 0.4 10*3/uL (ref 0.0–0.5)
Eosinophils Relative: 4 %
HCT: 52.8 % — ABNORMAL HIGH (ref 39.0–52.0)
Hemoglobin: 18 g/dL — ABNORMAL HIGH (ref 13.0–17.0)
Immature Granulocytes: 1 %
Lymphocytes Relative: 14 %
Lymphs Abs: 1.5 10*3/uL (ref 0.7–4.0)
MCH: 30.6 pg (ref 26.0–34.0)
MCHC: 34.1 g/dL (ref 30.0–36.0)
MCV: 89.6 fL (ref 80.0–100.0)
Monocytes Absolute: 1.4 10*3/uL — ABNORMAL HIGH (ref 0.1–1.0)
Monocytes Relative: 13 %
Neutro Abs: 7.3 10*3/uL (ref 1.7–7.7)
Neutrophils Relative %: 67 %
Platelet Count: 194 10*3/uL (ref 150–400)
RBC: 5.89 MIL/uL — ABNORMAL HIGH (ref 4.22–5.81)
RDW: 15.6 % — ABNORMAL HIGH (ref 11.5–15.5)
WBC Count: 10.8 10*3/uL — ABNORMAL HIGH (ref 4.0–10.5)
nRBC: 0 % (ref 0.0–0.2)

## 2021-08-27 LAB — CMP (CANCER CENTER ONLY)
ALT: 27 U/L (ref 0–44)
AST: 28 U/L (ref 15–41)
Albumin: 3.6 g/dL (ref 3.5–5.0)
Alkaline Phosphatase: 65 U/L (ref 38–126)
Anion gap: 8 (ref 5–15)
BUN: 18 mg/dL (ref 6–20)
CO2: 25 mmol/L (ref 22–32)
Calcium: 8.9 mg/dL (ref 8.9–10.3)
Chloride: 105 mmol/L (ref 98–111)
Creatinine: 1.44 mg/dL — ABNORMAL HIGH (ref 0.61–1.24)
GFR, Estimated: 58 mL/min — ABNORMAL LOW (ref >60)
Glucose, Bld: 111 mg/dL — ABNORMAL HIGH (ref 70–99)
Potassium: 4 mmol/L (ref 3.5–5.1)
Sodium: 138 mmol/L (ref 135–145)
Total Bilirubin: 1.1 mg/dL (ref 0.3–1.2)
Total Protein: 6.4 g/dL — ABNORMAL LOW (ref 6.5–8.1)

## 2021-08-28 LAB — IRON AND IRON BINDING CAPACITY (CC-WL,HP ONLY)
Iron: 77 ug/dL (ref 45–182)
Saturation Ratios: 17 % — ABNORMAL LOW (ref 17.9–39.5)
TIBC: 468 ug/dL — ABNORMAL HIGH (ref 250–450)
UIBC: 391 ug/dL — ABNORMAL HIGH (ref 117–376)

## 2021-08-28 LAB — FERRITIN: Ferritin: 18 ng/mL — ABNORMAL LOW (ref 24–336)

## 2021-09-08 ENCOUNTER — Other Ambulatory Visit: Payer: Self-pay | Admitting: Sports Medicine

## 2021-09-08 DIAGNOSIS — N5201 Erectile dysfunction due to arterial insufficiency: Secondary | ICD-10-CM

## 2021-09-27 ENCOUNTER — Ambulatory Visit (HOSPITAL_BASED_OUTPATIENT_CLINIC_OR_DEPARTMENT_OTHER): Payer: BC Managed Care – PPO | Attending: Sports Medicine | Admitting: Internal Medicine

## 2021-09-27 ENCOUNTER — Other Ambulatory Visit: Payer: Self-pay | Admitting: Sports Medicine

## 2021-09-27 ENCOUNTER — Other Ambulatory Visit: Payer: Self-pay

## 2021-09-27 VITALS — Ht 77.0 in | Wt 250.0 lb

## 2021-09-27 DIAGNOSIS — G4719 Other hypersomnia: Secondary | ICD-10-CM | POA: Diagnosis not present

## 2021-09-27 DIAGNOSIS — G4761 Periodic limb movement disorder: Secondary | ICD-10-CM | POA: Insufficient documentation

## 2021-09-27 DIAGNOSIS — G4733 Obstructive sleep apnea (adult) (pediatric): Secondary | ICD-10-CM | POA: Diagnosis present

## 2021-09-28 ENCOUNTER — Other Ambulatory Visit: Payer: BC Managed Care – PPO

## 2021-09-28 ENCOUNTER — Ambulatory Visit: Payer: BC Managed Care – PPO | Admitting: Family

## 2021-10-01 ENCOUNTER — Telehealth: Payer: Self-pay | Admitting: *Deleted

## 2021-10-01 ENCOUNTER — Inpatient Hospital Stay: Payer: BC Managed Care – PPO | Admitting: Family

## 2021-10-01 ENCOUNTER — Inpatient Hospital Stay: Payer: BC Managed Care – PPO | Attending: Hematology & Oncology

## 2021-10-01 ENCOUNTER — Encounter: Payer: Self-pay | Admitting: Family

## 2021-10-01 ENCOUNTER — Inpatient Hospital Stay: Payer: BC Managed Care – PPO

## 2021-10-01 ENCOUNTER — Other Ambulatory Visit: Payer: Self-pay

## 2021-10-01 DIAGNOSIS — D751 Secondary polycythemia: Secondary | ICD-10-CM

## 2021-10-01 LAB — CBC WITH DIFFERENTIAL (CANCER CENTER ONLY)
Abs Immature Granulocytes: 0.04 10*3/uL (ref 0.00–0.07)
Basophils Absolute: 0.1 10*3/uL (ref 0.0–0.1)
Basophils Relative: 1 %
Eosinophils Absolute: 0.3 10*3/uL (ref 0.0–0.5)
Eosinophils Relative: 4 %
HCT: 54.8 % — ABNORMAL HIGH (ref 39.0–52.0)
Hemoglobin: 17.9 g/dL — ABNORMAL HIGH (ref 13.0–17.0)
Immature Granulocytes: 1 %
Lymphocytes Relative: 17 %
Lymphs Abs: 1.3 10*3/uL (ref 0.7–4.0)
MCH: 28.5 pg (ref 26.0–34.0)
MCHC: 32.7 g/dL (ref 30.0–36.0)
MCV: 87.4 fL (ref 80.0–100.0)
Monocytes Absolute: 1 10*3/uL (ref 0.1–1.0)
Monocytes Relative: 13 %
Neutro Abs: 5 10*3/uL (ref 1.7–7.7)
Neutrophils Relative %: 64 %
Platelet Count: 207 10*3/uL (ref 150–400)
RBC: 6.27 MIL/uL — ABNORMAL HIGH (ref 4.22–5.81)
RDW: 14.4 % (ref 11.5–15.5)
WBC Count: 7.8 10*3/uL (ref 4.0–10.5)
nRBC: 0 % (ref 0.0–0.2)

## 2021-10-01 LAB — CMP (CANCER CENTER ONLY)
ALT: 28 U/L (ref 0–44)
AST: 26 U/L (ref 15–41)
Albumin: 3.8 g/dL (ref 3.5–5.0)
Alkaline Phosphatase: 70 U/L (ref 38–126)
Anion gap: 7 (ref 5–15)
BUN: 19 mg/dL (ref 6–20)
CO2: 25 mmol/L (ref 22–32)
Calcium: 9.5 mg/dL (ref 8.9–10.3)
Chloride: 105 mmol/L (ref 98–111)
Creatinine: 1.28 mg/dL — ABNORMAL HIGH (ref 0.61–1.24)
GFR, Estimated: >60 mL/min (ref >60)
Glucose, Bld: 105 mg/dL — ABNORMAL HIGH (ref 70–99)
Potassium: 4 mmol/L (ref 3.5–5.1)
Sodium: 137 mmol/L (ref 135–145)
Total Bilirubin: 0.8 mg/dL (ref 0.3–1.2)
Total Protein: 6.7 g/dL (ref 6.5–8.1)

## 2021-10-01 NOTE — Progress Notes (Signed)
?Hematology and Oncology Follow Up Visit ? ?[name] ?[id number] ?05/17/1966 56 y.o. ?10/01/2021 ? ? ?Principle Diagnosis:  ?Hemochromatosis, heterozygous for the H63D mutation ?JAK 2 Negative ?  ?Current Therapy:        ?Phlebotomy to maintain iron saturation less than 50% and ferritin less than 100 ?  ?Interim History:  Mr. Capri is here today for follow-up. He is doing well and has no complaints at this time.  ?He states that he went last week for his sleep study and is waiting to hear back about the results.  ?Hgb is 17.9, MCV 87, platelets 207 and WBC 7.8.  ?He is taking his baby aspirin daily.  ?No bleeding, abnormal bruising or petechiae.  ?No fever, chills, n/v, cough, rash, dizziness, headaches, vision changes, SOB, chest pain, palpitations, abdominal pain or changes in bowel or bladder habits.  ?No swelling, tenderness, numbness or tingling in his extremities.  ?No falls or syncope.  ?He has been eating well and staying hydrated throughout the day. His weight is stable at 256 lbs.  ? ?ECOG Performance Status: 0 - Asymptomatic ? ?Medications:  ?Allergies as of 10/01/2021   ?No Known Allergies ?  ? ?  ?Medication List  ?  ? ?  ? Accurate as of October 01, 2021  2:21 PM. If you have any questions, ask your nurse or doctor.  ?  ?  ? ?  ? ?Accu-Chek Guide test strip ?Generic drug: glucose blood ?TEST 1-4 TIMES DAILY ?  ?Adult Blood Pressure Cuff Lg Kit ?Use as needed ?  ?allopurinol 300 MG tablet ?Commonly known as: ZYLOPRIM ?TAKE 1 TABLET BY MOUTH EVERY DAY ?  ?AMBULATORY NON FORMULARY MEDICATION ?Single glucometer with lancets, test strips ?Test daily.  ?Disp qs x 3 months. ?E11.49 ?  ?AMBULATORY NON FORMULARY MEDICATION ?Single glucometer with lancets, test strips, patient to test 1-4 times daily. ?  ?amLODipine 5 MG tablet ?Commonly known as: NORVASC ?TAKE 1 TABLET BY MOUTH EVERY DAY ?  ?metoprolol tartrate 100 MG tablet ?Commonly known as: LOPRESSOR ?Take 1 tablet (100 mg total) by mouth 2 (two) times  daily. ?  ?Ozempic (0.25 or 0.5 MG/DOSE) 2 MG/1.5ML Sopn ?Generic drug: Semaglutide(0.25 or 0.5MG/DOS) ?INJECT 0.5 MG INTO THE SKIN ONCE A WEEK. ?  ?tadalafil 5 MG tablet ?Commonly known as: CIALIS ?TAKE ONE TABLET BY MOUTH DAILY AS NEEDED FOR ERECTILE DYSFUNCTION ?  ?testosterone cypionate 200 MG/ML injection ?Commonly known as: DEPOTESTOSTERONE CYPIONATE ?INJECT 1 ML (200 MG TOTAL) INTO THE MUSCLE EVERY 14 (FOURTEEN) DAYS. ?  ?valsartan-hydrochlorothiazide 320-25 MG tablet ?Commonly known as: DIOVAN-HCT ?TAKE 1 TABLET BY MOUTH EVERY DAY ?  ?vitamin B-12 1000 MCG tablet ?Commonly known as: CYANOCOBALAMIN ?Take 1 tablet (1,000 mcg total) by mouth daily. ?  ? ?  ? ? ?Allergies: No Known Allergies ? ?Past Medical History, Surgical history, Social history, and Family History were reviewed and updated. ? ?Review of Systems: ?All other 10 point review of systems is negative.  ? ?Physical Exam: ? vitals were not taken for this visit.  ? ?Wt Readings from Last 3 Encounters:  ?09/27/21 250 lb (113.4 kg)  ?07/30/21 248 lb (112.5 kg)  ?07/23/21 251 lb 0.6 oz (113.9 kg)  ? ? ?Ocular: Sclerae unicteric, pupils equal, round and reactive to light ?Ear-nose-throat: Oropharynx clear, dentition fair ?Lymphatic: No cervical or supraclavicular adenopathy ?Lungs no rales or rhonchi, good excursion bilaterally ?Heart regular rate and rhythm, no murmur appreciated ?Abd soft, nontender, positive bowel sounds ?MSK no focal spinal tenderness,  no joint edema ?Neuro: non-focal, well-oriented, appropriate affect ?Breasts: Deferred  ? ?Lab Results  ?Component Value Date  ? WBC 10.8 (H) 08/27/2021  ? HGB 18.0 (H) 08/27/2021  ? HCT 52.8 (H) 08/27/2021  ? MCV 89.6 08/27/2021  ? PLT 194 08/27/2021  ? ?Lab Results  ?Component Value Date  ? FERRITIN 18 (L) 08/27/2021  ? IRON 77 08/27/2021  ? TIBC 468 (H) 08/27/2021  ? UIBC 391 (H) 08/27/2021  ? IRONPCTSAT 17 (L) 08/27/2021  ? ?Lab Results  ?Component Value Date  ? RETICCTPCT 2.0 02/20/2021  ? RBC  5.89 (H) 08/27/2021  ? ?No results found for: KPAFRELGTCHN, LAMBDASER, KAPLAMBRATIO ?No results found for: IGGSERUM, IGA, IGMSERUM ?No results found for: TOTALPROTELP, ALBUMINELP, A1GS, A2GS, BETS, BETA2SER, GAMS, MSPIKE, SPEI ?  Chemistry   ?   ?Component Value Date/Time  ? NA 138 08/27/2021 1509  ? NA 142 06/18/2017 1425  ? K 4.0 08/27/2021 1509  ? K 4.1 06/18/2017 1425  ? CL 105 08/27/2021 1509  ? CL 103 06/18/2017 1425  ? CO2 25 08/27/2021 1509  ? CO2 26 06/18/2017 1425  ? BUN 18 08/27/2021 1509  ? BUN 24 (H) 06/18/2017 1425  ? CREATININE 1.44 (H) 08/27/2021 1509  ? CREATININE 1.60 (H) 01/12/2021 0000  ?    ?Component Value Date/Time  ? CALCIUM 8.9 08/27/2021 1509  ? CALCIUM 10.0 06/18/2017 1425  ? ALKPHOS 65 08/27/2021 1509  ? ALKPHOS 95 (H) 06/18/2017 1425  ? AST 28 08/27/2021 1509  ? ALT 27 08/27/2021 1509  ? ALT 63 (H) 06/18/2017 1425  ? BILITOT 1.1 08/27/2021 1509  ?  ? ? ? ?Impression and Plan: Mr. Conover is a very pleasant 56 yo caucasian gentleman with hemochromatosis, heterozygous for the H63D mutation. ?Iron studies are pending.  ?He was able to go for sleep study and is awaiting results.  ?Follow-up in 8 weeks.  ? ?Lottie Dawson, NP ?4/10/20232:21 PM ? ?

## 2021-10-01 NOTE — Telephone Encounter (Signed)
Per 10/01/21 los - gave upcoming appointments - confirmed ?

## 2021-10-02 LAB — IRON AND IRON BINDING CAPACITY (CC-WL,HP ONLY)
Iron: 53 ug/dL (ref 45–182)
Saturation Ratios: 10 % — ABNORMAL LOW (ref 17.9–39.5)
TIBC: 511 ug/dL — ABNORMAL HIGH (ref 250–450)
UIBC: 458 ug/dL — ABNORMAL HIGH (ref 117–376)

## 2021-10-02 LAB — FERRITIN: Ferritin: 19 ng/mL — ABNORMAL LOW (ref 24–336)

## 2021-10-07 DIAGNOSIS — G4719 Other hypersomnia: Secondary | ICD-10-CM | POA: Diagnosis not present

## 2021-10-07 NOTE — Procedures (Signed)
? ? ? ?Patient Name: Jonathan Oliver, Jonathan Oliver ?Study Date: 09/27/2021 ?Gender: Male ?D.O.B: 10/08/1965 ?Age (years): 26 ?Referring Provider: Silverio Decamp ?Height (inches): 77 ?Interpreting Physician: Baird Lyons MD, ABSM ?Weight (lbs): 250 ?RPSGT: Jorge Ny ?BMI: 30 ?MRN: [id number] ?Neck Size: 17.00 ? ?CLINICAL INFORMATION ?Sleep Study Type: NPSG ?Indication for sleep study: Daytime Fatigue, Diabetes, Hypertension, Obesity, Re-Evaluation, Snoring ?Epworth Sleepiness Score: 5 ? ?SLEEP STUDY TECHNIQUE ?As per the AASM Manual for the Scoring of Sleep and Associated Events v2.3 (April 2016) with a hypopnea requiring 4% desaturations. ? ?The channels recorded and monitored were frontal, central and occipital EEG, electrooculogram (EOG), submentalis EMG (chin), nasal and oral airflow, thoracic and abdominal wall motion, anterior tibialis EMG, snore microphone, electrocardiogram, and pulse oximetry. ? ?MEDICATIONS ?Medications self-administered by patient taken the night of the study : none reported ? ?SLEEP ARCHITECTURE ?The study was initiated at 9:57:48 PM and ended at 4:48:39 AM. ? ?Sleep onset time was 2.0 minutes and the sleep efficiency was 83.5%%. The total sleep time was 343 minutes. ? ?Stage REM latency was 126.5 minutes. ? ?The patient spent 7.4%% of the night in stage N1 sleep, 70.0%% in stage N2 sleep, 6.3%% in stage N3 and 16.3% in REM. ? ?Alpha intrusion was absent. ? ?Supine sleep was 0.00%. ? ?RESPIRATORY PARAMETERS ?The overall apnea/hypopnea index (AHI) was 5.8 per hour. There were 4 total apneas, including 3 obstructive, 1 central and 0 mixed apneas. There were 29 hypopneas and 90 RERAs. ? ?The AHI during Stage REM sleep was 7.5 per hour. ? ?AHI while supine was N/A per hour. ? ?The mean oxygen saturation was 92.3%. The minimum SpO2 during sleep was 87.0%. ? ?moderate snoring was noted during this study. ? ?CARDIAC DATA ?The 2 lead EKG demonstrated probable Atrial Fibrillation. The mean heart  rate was 73.7 beats per minute. Other EKG findings include: None. ?LEG MOVEMENT DATA ?The total PLMS were 316 with a resulting PLMS index of 55.3. Associated arousal with leg movement index was 6.1 . ? ?IMPRESSIONS ?- Mild obstructive sleep apnea occurred during this study (AHI = 5.8/h). ?- No significant central sleep apnea occurred during this study (CAI = 0.2/h). ?- Mild oxygen desaturation was noted during this study (Min O2 = 87.0%). Mean 92.4% ?- The patient snored with moderate snoring volume. ?- Cardiac rhythm was irregularly irregular and appeared to be Atrial Fibrillation on single lead recording. ?- Periodic Limb Movement with Arousal. ? ?DIAGNOSIS ?- Obstructive Sleep Apnea (G47.33) ?- Periodic Limb Movement sleep disorder ? ?RECOMMENDATIONS ?- Treatment for very mild OSA is directed by symptoms and co-morbidities. Conservative measures may include observation, weight loss and sleep position off back. Other options,including CPAP, a fitted oral appliance, or ENT evaluation would be based on clinical judgment. ?- Consider trial of Mirapex, Requip, or Sinemet for treatment of Periodic Limb Movements of Sleep. ?- Reassess cardiac rhythm. ?- Be careful with alcohol, sedatives and other CNS depressants that may worsen sleep apnea and disrupt normal sleep architecture. ?- Sleep hygiene should be reviewed to assess factors that may improve sleep quality. ?- Weight management and regular exercise should be initiated or continued if appropriate. ? ?[Electronically signed] 10/07/2021 12:10 PM ? ?Baird Lyons MD, ABSM ?Diplomate, Tax adviser of Sleep Medicine ?NPI: [id number] ?  ? ? ? ? ? ? ? ? ? ? ? ? ? ? ? ? ? ? ? ? ? ?Dionte Blaustein ?Diplomate, Tax adviser of Sleep Medicine ? ?ELECTRONICALLY SIGNED ON:  10/07/2021, 12:00 PM ?[place] ?  PH: [phone]   FX: [phone] ?ACCREDITED BY THE AMERICAN ACADEMY OF SLEEP MEDICINE ?

## 2021-10-13 ENCOUNTER — Other Ambulatory Visit: Payer: Self-pay | Admitting: Sports Medicine

## 2021-10-13 DIAGNOSIS — E349 Endocrine disorder, unspecified: Secondary | ICD-10-CM

## 2021-10-19 ENCOUNTER — Ambulatory Visit (INDEPENDENT_AMBULATORY_CARE_PROVIDER_SITE_OTHER): Payer: BC Managed Care – PPO | Admitting: Sports Medicine

## 2021-10-19 ENCOUNTER — Encounter: Payer: Self-pay | Admitting: Sports Medicine

## 2021-10-19 ENCOUNTER — Other Ambulatory Visit: Payer: Self-pay | Admitting: Sports Medicine

## 2021-10-19 VITALS — BP 107/72 | HR 71 | Ht 76.0 in | Wt 249.0 lb

## 2021-10-19 DIAGNOSIS — E1149 Type 2 diabetes mellitus with other diabetic neurological complication: Secondary | ICD-10-CM | POA: Diagnosis not present

## 2021-10-19 DIAGNOSIS — M1000 Idiopathic gout, unspecified site: Secondary | ICD-10-CM

## 2021-10-19 DIAGNOSIS — J0110 Acute frontal sinusitis, unspecified: Secondary | ICD-10-CM | POA: Diagnosis not present

## 2021-10-19 LAB — POCT GLYCOSYLATED HEMOGLOBIN (HGB A1C): HbA1c, POC (controlled diabetic range): 6.1 % (ref 0.0–7.0)

## 2021-10-19 MED ORDER — AMOXICILLIN-POT CLAVULANATE 875-125 MG PO TABS: 1.0000 | ORAL_TABLET | Freq: Two times a day (BID) | ORAL | 0 refills | Status: AC

## 2021-10-19 MED ORDER — PREDNISONE 50 MG PO TABS: 50.0000 mg | ORAL_TABLET | Freq: Every day | ORAL | 0 refills | Status: DC

## 2021-10-19 NOTE — Assessment & Plan Note (Signed)
Has been doing a lot of mowing in the yard, having severe frontal sinus pain and pressure with purulent nasal discharge, sometimes bloody, adding some prednisone and Augmentin, return to see me as needed for this. ?

## 2021-10-19 NOTE — Assessment & Plan Note (Signed)
Jonathan Oliver returns, he is doing really well, currently Ozempic, A1c is 6.1% today, he is up-to-date on screening measures with the exception of eye exam, he declines this which is fine. ?Return to see [place] in about 6 months for repeat A1c and we will probably get all of his labs at that point. ?

## 2021-10-19 NOTE — Progress Notes (Signed)
? ? ?  Procedures performed today:   ? ?None. ? ?Independent interpretation of notes and tests performed by another provider:  ? ?None. ? ?Brief History, Exam, Impression, and Recommendations:   ? ?Type 2 diabetes mellitus with neurological complications ([place]) ?Jonathan Oliver returns, he is doing really well, currently Ozempic, A1c is 6.1% today, he is up-to-date on screening measures with the exception of eye exam, he declines this which is fine. ?Return to see [place] in about 6 months for repeat A1c and we will probably get all of his labs at that point. ? ?Acute frontal sinusitis ?Has been doing a lot of mowing in the yard, having severe frontal sinus pain and pressure with purulent nasal discharge, sometimes bloody, adding some prednisone and Augmentin, return to see me as needed for this. ? ?Chronic process with exacerbation and pharmacologic intervention ? ?___________________________________________ ?Gwen Her. Dianah Field, M.D., ABFM., CAQSM. ?Primary Care and Sports Medicine ?[place] ? ?Adjunct Instructor of Family Medicine  ?[employer] ?

## 2021-11-05 ENCOUNTER — Other Ambulatory Visit: Payer: Self-pay | Admitting: Sports Medicine

## 2021-11-30 ENCOUNTER — Encounter: Payer: Self-pay | Admitting: Family

## 2021-11-30 ENCOUNTER — Inpatient Hospital Stay: Payer: BC Managed Care – PPO

## 2021-11-30 ENCOUNTER — Inpatient Hospital Stay: Payer: BC Managed Care – PPO | Attending: Hematology & Oncology

## 2021-11-30 ENCOUNTER — Encounter: Payer: Self-pay | Admitting: Sports Medicine

## 2021-11-30 ENCOUNTER — Other Ambulatory Visit: Payer: Self-pay

## 2021-11-30 ENCOUNTER — Other Ambulatory Visit: Payer: Self-pay | Admitting: Oncology

## 2021-11-30 ENCOUNTER — Inpatient Hospital Stay: Payer: BC Managed Care – PPO | Admitting: Family

## 2021-11-30 DIAGNOSIS — D751 Secondary polycythemia: Secondary | ICD-10-CM

## 2021-11-30 LAB — CBC WITH DIFFERENTIAL (CANCER CENTER ONLY)
Abs Immature Granulocytes: 0.05 10*3/uL (ref 0.00–0.07)
Basophils Absolute: 0.1 10*3/uL (ref 0.0–0.1)
Basophils Relative: 1 %
Eosinophils Absolute: 0.3 10*3/uL (ref 0.0–0.5)
Eosinophils Relative: 4 %
HCT: 58.1 % — ABNORMAL HIGH (ref 39.0–52.0)
Hemoglobin: 19.3 g/dL — ABNORMAL HIGH (ref 13.0–17.0)
Immature Granulocytes: 1 %
Lymphocytes Relative: 21 %
Lymphs Abs: 1.5 10*3/uL (ref 0.7–4.0)
MCH: 28.6 pg (ref 26.0–34.0)
MCHC: 33.2 g/dL (ref 30.0–36.0)
MCV: 85.9 fL (ref 80.0–100.0)
Monocytes Absolute: 0.6 10*3/uL (ref 0.1–1.0)
Monocytes Relative: 9 %
Neutro Abs: 4.5 10*3/uL (ref 1.7–7.7)
Neutrophils Relative %: 64 %
Platelet Count: 162 10*3/uL (ref 150–400)
RBC: 6.76 MIL/uL — ABNORMAL HIGH (ref 4.22–5.81)
RDW: 18.3 % — ABNORMAL HIGH (ref 11.5–15.5)
WBC Count: 7 10*3/uL (ref 4.0–10.5)
nRBC: 0 % (ref 0.0–0.2)

## 2021-11-30 LAB — FERRITIN: Ferritin: 24 ng/mL (ref 24–336)

## 2021-11-30 LAB — CMP (CANCER CENTER ONLY)
ALT: 46 U/L — ABNORMAL HIGH (ref 0–44)
AST: 34 U/L (ref 15–41)
Albumin: 3.9 g/dL (ref 3.5–5.0)
Alkaline Phosphatase: 75 U/L (ref 38–126)
Anion gap: 10 (ref 5–15)
BUN: 34 mg/dL — ABNORMAL HIGH (ref 6–20)
CO2: 22 mmol/L (ref 22–32)
Calcium: 9.5 mg/dL (ref 8.9–10.3)
Chloride: 102 mmol/L (ref 98–111)
Creatinine: 1.51 mg/dL — ABNORMAL HIGH (ref 0.61–1.24)
GFR, Estimated: 55 mL/min — ABNORMAL LOW (ref >60)
Glucose, Bld: 316 mg/dL — ABNORMAL HIGH (ref 70–99)
Potassium: 3.9 mmol/L (ref 3.5–5.1)
Sodium: 134 mmol/L — ABNORMAL LOW (ref 135–145)
Total Bilirubin: 0.7 mg/dL (ref 0.3–1.2)
Total Protein: 6.7 g/dL (ref 6.5–8.1)

## 2021-11-30 NOTE — Progress Notes (Signed)
Hematology and Oncology Follow Up Visit  Jonathan Oliver [id number] Apr 16, 1966 56 y.o. 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 He is doing well and has no complaints at this time.  No c/o fatigue.  He is taking his aspirin daily.  No fever, chills, n/v, cough, rash, dizziness, headaches, changes in vision, SOB, chest pain, palpitations, abdominal pain or changes in bowel or bladder habits. He notes abdominal bloating once he stands after eating a meal.   No swelling, tenderness, numbness or tingling in his extremities at this time.  No falls or syncope.  Appetite and hydration are good. His weight is stable at 253 lbs.   ECOG Performance Status: 1 - Symptomatic but completely ambulatory  Medications:  Allergies as of 11/30/2021   No Known Allergies      Medication List        Accurate as of November 30, 2021  3:42 PM. If you have any questions, ask your nurse or doctor.          STOP taking these medications    predniSONE 50 MG tablet Commonly known as: DELTASONE Stopped by: Lottie Dawson, NP       TAKE these medications    Accu-Chek Guide test strip Generic drug: glucose blood TEST 1-4 TIMES DAILY   Accu-Chek Softclix Lancets lancets TEST 1-4 TIMES DAILY   Adult Blood Pressure Cuff Lg Kit Use as needed   allopurinol 300 MG tablet Commonly known as: ZYLOPRIM TAKE 1 TABLET BY MOUTH EVERY DAY   AMBULATORY NON FORMULARY MEDICATION Single glucometer with lancets, test strips Test daily.  Disp qs x 3 months. E11.49   AMBULATORY NON FORMULARY MEDICATION Single glucometer with lancets, test strips, patient to test 1-4 times daily.   amLODipine 5 MG tablet Commonly known as: NORVASC TAKE 1 TABLET BY MOUTH EVERY DAY   metoprolol  tartrate 100 MG tablet Commonly known as: LOPRESSOR Take 1 tablet (100 mg total) by mouth 2 (two) times daily.   Ozempic (0.25 or 0.5 MG/DOSE) 2 MG/1.5ML Sopn Generic drug: Semaglutide(0.25 or 0.5MG/DOS) INJECT 0.5 MG INTO THE SKIN ONCE A WEEK.   tadalafil 5 MG tablet Commonly known as: CIALIS TAKE ONE TABLET BY MOUTH DAILY AS NEEDED FOR ERECTILE DYSFUNCTION   testosterone cypionate 200 MG/ML injection Commonly known as: DEPOTESTOSTERONE CYPIONATE INJECT 1 ML (200 MG TOTAL) INTO THE MUSCLE EVERY 14 DAYS   valsartan-hydrochlorothiazide 320-25 MG tablet Commonly known as: DIOVAN-HCT TAKE 1 TABLET BY MOUTH EVERY DAY   vitamin B-12 1000 MCG tablet Commonly known as: CYANOCOBALAMIN Take 1 tablet (1,000 mcg total) by mouth daily.        Allergies: No Known Allergies  Past Medical History, Surgical history, Social history, and Family History were reviewed and updated.  Review of Systems: All other 10 point review of systems is negative.   Physical Exam:  height is 6' 4"  (1.93 m) and weight is 253 lb (114.8 kg). His oral temperature is 98.2 F (36.8 C). His blood pressure is 123/89 and his pulse is 88. His respiration is 18 and oxygen saturation is 95%.   Wt Readings from Last 3 Encounters:  11/30/21 253 lb (114.8 kg)  10/19/21 249 lb (112.9 kg)  10/01/21 256 lb 12.8 oz (116.5 kg)  Ocular: Sclerae unicteric, pupils equal, round and reactive to light Ear-nose-throat: Oropharynx clear, dentition fair Lymphatic: No cervical or supraclavicular adenopathy Lungs no rales or rhonchi, good excursion bilaterally Heart regular rate and rhythm, no murmur appreciated Abd soft, nontender, positive bowel sounds MSK no focal spinal tenderness, no joint edema Neuro: non-focal, well-oriented, appropriate affect Breasts: Deferred   Lab Results  Component Value Date   WBC 7.0 11/30/2021   HGB 19.3 (H) 11/30/2021   HCT 58.1 (H) 11/30/2021   MCV 85.9 11/30/2021   PLT 162 11/30/2021    Lab Results  Component Value Date   FERRITIN 19 (L) 10/01/2021   IRON 53 10/01/2021   TIBC 511 (H) 10/01/2021   UIBC 458 (H) 10/01/2021   IRONPCTSAT 10 (L) 10/01/2021   Lab Results  Component Value Date   RETICCTPCT 2.0 02/20/2021   RBC 6.76 (H) 11/30/2021   No results found for: "KPAFRELGTCHN", "LAMBDASER", "KAPLAMBRATIO" No results found for: "IGGSERUM", "IGA", "IGMSERUM" No results found for: "TOTALPROTELP", "ALBUMINELP", "A1GS", "A2GS", "BETS", "BETA2SER", "GAMS", "MSPIKE", "SPEI"   Chemistry      Component Value Date/Time   NA 134 (L) 11/30/2021 1449   NA 142 06/18/2017 1425   K 3.9 11/30/2021 1449   K 4.1 06/18/2017 1425   CL 102 11/30/2021 1449   CL 103 06/18/2017 1425   CO2 22 11/30/2021 1449   CO2 26 06/18/2017 1425   BUN 34 (H) 11/30/2021 1449   BUN 24 (H) 06/18/2017 1425   CREATININE 1.51 (H) 11/30/2021 1449   CREATININE 1.60 (H) 01/12/2021 0000      Component Value Date/Time   CALCIUM 9.5 11/30/2021 1449   CALCIUM 10.0 06/18/2017 1425   ALKPHOS 75 11/30/2021 1449   ALKPHOS 95 (H) 06/18/2017 1425   AST 34 11/30/2021 1449   ALT 46 (H) 11/30/2021 1449   ALT 63 (H) 06/18/2017 1425   BILITOT 0.7 11/30/2021 1449       Impression and Plan: [name] is a very pleasant 56 yo caucasian gentleman with hemochromatosis, heterozygous for the H63D mutation. Iron studies pending.  Phlebotomy today for Hgb 19.3/Hct 58%. Follow-up in 8 weeks.   Lottie Dawson, NP 6/9/20233:42 PM

## 2021-11-30 NOTE — Patient Instructions (Signed)

## 2021-11-30 NOTE — Progress Notes (Signed)
Jonathan Oliver. presents today for phlebotomy per MD orders. Phlebotomy procedure started at 1540 and ended at 1547. 530 cc removed via 16 G at [facility] site. Patient tolerated procedure well. Refused to wait 30 minutes after procedure.Released stable and ASX.

## 2021-12-03 LAB — IRON AND IRON BINDING CAPACITY (CC-WL,HP ONLY)
Iron: 71 ug/dL (ref 45–182)
Saturation Ratios: 15 % — ABNORMAL LOW (ref 17.9–39.5)
TIBC: 465 ug/dL — ABNORMAL HIGH (ref 250–450)
UIBC: 394 ug/dL — ABNORMAL HIGH (ref 117–376)

## 2021-12-06 ENCOUNTER — Telehealth: Payer: Self-pay | Admitting: *Deleted

## 2021-12-06 NOTE — Telephone Encounter (Signed)
Per 12/10/21 los - called and lvm of upcoming appointments - requested call back to confirm.

## 2021-12-11 ENCOUNTER — Telehealth (INDEPENDENT_AMBULATORY_CARE_PROVIDER_SITE_OTHER): Payer: BC Managed Care – PPO | Admitting: Sports Medicine

## 2021-12-11 DIAGNOSIS — G4733 Obstructive sleep apnea (adult) (pediatric): Secondary | ICD-10-CM | POA: Diagnosis not present

## 2021-12-11 NOTE — Progress Notes (Signed)
   Virtual Visit via Telephone   I connected with  [name]  on 12/11/21 by telephone/telehealth and verified that I am speaking with the correct person using two identifiers.   I discussed the limitations, risks, security and privacy concerns of performing an evaluation and management service by telephone, including the higher likelihood of inaccurate diagnosis and treatment, and the availability of in person appointments.  We also discussed the likely need of an additional face to face encounter for complete and high quality delivery of care.  I also discussed with the patient that there may be a patient responsible charge related to this service. The patient expressed understanding and wishes to proceed.  Provider location is in medical facility. Patient location is at their home, different from provider location. People involved in care of the patient during this telehealth encounter were myself, my nurse/medical assistant, and my front office/scheduling team member.  Review of Systems: No fevers, chills, night sweats, weight loss, chest pain, or shortness of breath.   Objective Findings:    General: Speaking full sentences, no audible heavy breathing.  Sounds alert and appropriately interactive.    Independent interpretation of tests performed by another provider:   None.  Brief History, Exam, Impression, and Recommendations:    Mild obstructive sleep apnea with snoring Harriet is a very pleasant 56 year old male, he has polycythemia, hemochromatosis. He gets regular phlebotomies. He is also on testosterone supplementation. He is post palatoplasty in the distant past. In the hopes of finding an explanation and hopefully another way to treat his polycythemia he was taken off testosterone which did not significantly improve his hemoglobin/RBC count, a sleep study was performed, lowest desat was into the upper 80s, and OSA was considered very very mild. I do not think this is a  cause. I am okay with him restarting his testosterone as he does note significant worsening of energy off of it. He will continue his phlebotomies. Principal complaint is snoring more so than excessive daytime sleepiness so I will have him work with ENT to see if he can be fit with an oral appliance to protract his mandible at night.   I discussed the above assessment and treatment plan with the patient. The patient was provided an opportunity to ask questions and all were answered. The patient agreed with the plan and demonstrated an understanding of the instructions.   The patient was advised to call back or seek an in-person evaluation if the symptoms worsen or if the condition fails to improve as anticipated.   I provided 30 minutes of verbal and non-verbal time during this encounter date, time was needed to gather information, review chart, records, communicate/coordinate with staff remotely, as well as complete documentation.   ___________________________________________ Gwen Her. Dianah Field, M.D., ABFM., CAQSM. Primary Care and Sports Medicine [facility]  Adjunct Professor of [place] [employer] [facility] [facility] [employer]

## 2021-12-11 NOTE — Assessment & Plan Note (Addendum)
Jonathan Oliver is a very pleasant 56 year old male, he has polycythemia, hemochromatosis. He gets regular phlebotomies. He is also on testosterone supplementation. He is post palatoplasty in the distant past. In the hopes of finding an explanation and hopefully another way to treat his polycythemia he was taken off testosterone which did not significantly improve his hemoglobin/RBC count, a sleep study was performed, lowest desat was into the upper 80s, and OSA was considered very very mild. I do not think this is a cause. I am okay with him restarting his testosterone as he does note significant worsening of energy off of it. He will continue his phlebotomies. Principal complaint is snoring more so than excessive daytime sleepiness so I will have him work with ENT to see if he can be fit with an oral appliance to protract his mandible at night.

## 2021-12-15 ENCOUNTER — Other Ambulatory Visit: Payer: Self-pay | Admitting: Sports Medicine

## 2021-12-15 DIAGNOSIS — E1149 Type 2 diabetes mellitus with other diabetic neurological complication: Secondary | ICD-10-CM

## 2021-12-19 ENCOUNTER — Other Ambulatory Visit: Payer: Self-pay

## 2022-01-13 ENCOUNTER — Other Ambulatory Visit: Payer: Self-pay | Admitting: Sports Medicine

## 2022-01-13 DIAGNOSIS — E538 Deficiency of other specified B group vitamins: Secondary | ICD-10-CM

## 2022-01-18 ENCOUNTER — Other Ambulatory Visit: Payer: Self-pay | Admitting: Sports Medicine

## 2022-01-18 DIAGNOSIS — I10 Essential (primary) hypertension: Secondary | ICD-10-CM

## 2022-01-25 ENCOUNTER — Inpatient Hospital Stay: Payer: BC Managed Care – PPO | Attending: Hematology & Oncology

## 2022-01-25 ENCOUNTER — Encounter: Payer: Self-pay | Admitting: Family

## 2022-01-25 ENCOUNTER — Inpatient Hospital Stay (HOSPITAL_BASED_OUTPATIENT_CLINIC_OR_DEPARTMENT_OTHER): Payer: BC Managed Care – PPO | Admitting: Family

## 2022-01-25 ENCOUNTER — Inpatient Hospital Stay: Payer: BC Managed Care – PPO

## 2022-01-25 ENCOUNTER — Telehealth: Payer: Self-pay | Admitting: *Deleted

## 2022-01-25 DIAGNOSIS — D751 Secondary polycythemia: Secondary | ICD-10-CM

## 2022-01-25 LAB — CMP (CANCER CENTER ONLY)
ALT: 32 U/L (ref 0–44)
AST: 28 U/L (ref 15–41)
Albumin: 4.2 g/dL (ref 3.5–5.0)
Alkaline Phosphatase: 62 U/L (ref 38–126)
Anion gap: 9 (ref 5–15)
BUN: 24 mg/dL — ABNORMAL HIGH (ref 6–20)
CO2: 24 mmol/L (ref 22–32)
Calcium: 9.7 mg/dL (ref 8.9–10.3)
Chloride: 105 mmol/L (ref 98–111)
Creatinine: 1.54 mg/dL — ABNORMAL HIGH (ref 0.61–1.24)
GFR, Estimated: 53 mL/min — ABNORMAL LOW (ref >60)
Glucose, Bld: 149 mg/dL — ABNORMAL HIGH (ref 70–99)
Potassium: 4.3 mmol/L (ref 3.5–5.1)
Sodium: 138 mmol/L (ref 135–145)
Total Bilirubin: 1.1 mg/dL (ref 0.3–1.2)
Total Protein: 6.7 g/dL (ref 6.5–8.1)

## 2022-01-25 LAB — CBC WITH DIFFERENTIAL (CANCER CENTER ONLY)
Abs Immature Granulocytes: 0.09 10*3/uL — ABNORMAL HIGH (ref 0.00–0.07)
Basophils Absolute: 0.1 10*3/uL (ref 0.0–0.1)
Basophils Relative: 1 %
Eosinophils Absolute: 0.4 10*3/uL (ref 0.0–0.5)
Eosinophils Relative: 5 %
HCT: 53 % — ABNORMAL HIGH (ref 39.0–52.0)
Hemoglobin: 17.8 g/dL — ABNORMAL HIGH (ref 13.0–17.0)
Immature Granulocytes: 1 %
Lymphocytes Relative: 14 %
Lymphs Abs: 1.1 10*3/uL (ref 0.7–4.0)
MCH: 30.6 pg (ref 26.0–34.0)
MCHC: 33.6 g/dL (ref 30.0–36.0)
MCV: 91.2 fL (ref 80.0–100.0)
Monocytes Absolute: 0.8 10*3/uL (ref 0.1–1.0)
Monocytes Relative: 10 %
Neutro Abs: 5.5 10*3/uL (ref 1.7–7.7)
Neutrophils Relative %: 69 %
Platelet Count: 170 10*3/uL (ref 150–400)
RBC: 5.81 MIL/uL (ref 4.22–5.81)
RDW: 16.6 % — ABNORMAL HIGH (ref 11.5–15.5)
WBC Count: 8 10*3/uL (ref 4.0–10.5)
nRBC: 0 % (ref 0.0–0.2)

## 2022-01-25 LAB — IRON AND IRON BINDING CAPACITY (CC-WL,HP ONLY)
Iron: 147 ug/dL (ref 45–182)
Saturation Ratios: 33 % (ref 17.9–39.5)
TIBC: 445 ug/dL (ref 250–450)
UIBC: 298 ug/dL (ref 117–376)

## 2022-01-25 LAB — FERRITIN: Ferritin: 44 ng/mL (ref 24–336)

## 2022-01-25 NOTE — Telephone Encounter (Signed)
Per 01/25/22 los - called and gave upcoming appointments - confirmed

## 2022-01-25 NOTE — Progress Notes (Signed)
Hematology and Oncology Follow Up Visit  Jonathan Oliver [id number] 03-10-1966 56 y.o. 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 He is doing well and has no complaints at this time.  He states that he donated blood last week. Hgb is 17.8/Hct 53%. Iron studies are pending.  No bruising or petechiae.  No fever, chills, n/v, cough, rash, dizziness, SOB, chest pain, palpitations abdominal pain or changes in bowel or bladder habits.  No swelling, tenderness, numbness or tingling in his extremities.  No falls or syncope.  Appetite and hydration are good. Weight is stable at 249 lbs.   ECOG Performance Status: 1 - Symptomatic but completely ambulatory  Medications:  Allergies as of 01/25/2022   No Known Allergies      Medication List        Accurate as of January 25, 2022 10:26 AM. If you have any questions, ask your nurse or doctor.          Accu-Chek Guide test strip Generic drug: glucose blood TEST 1-4 TIMES DAILY   Accu-Chek Softclix Lancets lancets TEST 1-4 TIMES DAILY   Adult Blood Pressure Cuff Lg Kit Use as needed   allopurinol 300 MG tablet Commonly known as: ZYLOPRIM TAKE 1 TABLET BY MOUTH EVERY DAY   AMBULATORY NON FORMULARY MEDICATION Single glucometer with lancets, test strips Test daily.  Disp qs x 3 months. E11.49   AMBULATORY NON FORMULARY MEDICATION Single glucometer with lancets, test strips, patient to test 1-4 times daily.   amLODipine 5 MG tablet Commonly known as: NORVASC TAKE 1 TABLET BY MOUTH EVERY DAY   cyanocobalamin 1000 MCG tablet Commonly known as: VITAMIN B12 TAKE 1 TABLET BY MOUTH EVERY DAY   metoprolol tartrate 100 MG tablet Commonly known as: LOPRESSOR Take 1 tablet (100 mg  total) by mouth 2 (two) times daily.   Ozempic (0.25 or 0.5 MG/DOSE) 2 MG/1.5ML Sopn Generic drug: Semaglutide(0.25 or 0.5MG/DOS) INJECT 0.5 MG INTO THE SKIN ONCE A WEEK.   tadalafil 5 MG tablet Commonly known as: CIALIS TAKE ONE TABLET BY MOUTH DAILY AS NEEDED FOR ERECTILE DYSFUNCTION   testosterone cypionate 200 MG/ML injection Commonly known as: DEPOTESTOSTERONE CYPIONATE INJECT 1 ML (200 MG TOTAL) INTO THE MUSCLE EVERY 14 DAYS   valsartan-hydrochlorothiazide 320-25 MG tablet Commonly known as: DIOVAN-HCT TAKE 1 TABLET BY MOUTH EVERY DAY        Allergies: No Known Allergies  Past Medical History, Surgical history, Social history, and Family History were reviewed and updated.  Review of Systems: All other 10 point review of systems is negative.   Physical Exam:  vitals were not taken for this visit.   Wt Readings from Last 3 Encounters:  11/30/21 253 lb (114.8 kg)  10/19/21 249 lb (112.9 kg)  10/01/21 256 lb 12.8 oz (116.5 kg)    Ocular: Sclerae unicteric, pupils equal, round and reactive to light Ear-nose-throat: Oropharynx clear, dentition fair Lymphatic: No cervical or supraclavicular adenopathy Lungs no rales or rhonchi, good excursion bilaterally Heart regular rate and rhythm, no murmur appreciated Abd soft, nontender, positive bowel sounds MSK no focal spinal tenderness, no joint edema Neuro: non-focal, well-oriented, appropriate affect Breasts: Deferred   Lab Results  Component Value Date   WBC  7.0 11/30/2021   HGB 19.3 (H) 11/30/2021   HCT 58.1 (H) 11/30/2021   MCV 85.9 11/30/2021   PLT 162 11/30/2021   Lab Results  Component Value Date   FERRITIN 24 11/30/2021   IRON 71 11/30/2021   TIBC 465 (H) 11/30/2021   UIBC 394 (H) 11/30/2021   IRONPCTSAT 15 (L) 11/30/2021   Lab Results  Component Value Date   RETICCTPCT 2.0 02/20/2021   RBC 6.76 (H) 11/30/2021   No results found for: "KPAFRELGTCHN", "LAMBDASER", "KAPLAMBRATIO" No results found  for: "IGGSERUM", "IGA", "IGMSERUM" No results found for: "TOTALPROTELP", "ALBUMINELP", "A1GS", "A2GS", "BETS", "BETA2SER", "GAMS", "MSPIKE", "SPEI"   Chemistry      Component Value Date/Time   NA 134 (L) 11/30/2021 1449   NA 142 06/18/2017 1425   K 3.9 11/30/2021 1449   K 4.1 06/18/2017 1425   CL 102 11/30/2021 1449   CL 103 06/18/2017 1425   CO2 22 11/30/2021 1449   CO2 26 06/18/2017 1425   BUN 34 (H) 11/30/2021 1449   BUN 24 (H) 06/18/2017 1425   CREATININE 1.51 (H) 11/30/2021 1449   CREATININE 1.60 (H) 01/12/2021 0000      Component Value Date/Time   CALCIUM 9.5 11/30/2021 1449   CALCIUM 10.0 06/18/2017 1425   ALKPHOS 75 11/30/2021 1449   ALKPHOS 95 (H) 06/18/2017 1425   AST 34 11/30/2021 1449   ALT 46 (H) 11/30/2021 1449   ALT 63 (H) 06/18/2017 1425   BILITOT 0.7 11/30/2021 1449       Impression and Plan: Jonathan Oliver is a very pleasant 56 yo caucasian gentleman with hemochromatosis, heterozygous for the H63D mutation. Iron studies are pending.  Will schedule phlebotomy if needed.  Follow-up in 2 months.   Jonathan Dawson, NP 8/4/202310:26 AM

## 2022-01-30 ENCOUNTER — Ambulatory Visit: Payer: BC Managed Care – PPO | Admitting: Family

## 2022-01-30 ENCOUNTER — Other Ambulatory Visit: Payer: BC Managed Care – PPO

## 2022-02-12 ENCOUNTER — Other Ambulatory Visit: Payer: Self-pay | Admitting: Sports Medicine

## 2022-02-12 DIAGNOSIS — I10 Essential (primary) hypertension: Secondary | ICD-10-CM

## 2022-02-12 DIAGNOSIS — E1159 Type 2 diabetes mellitus with other circulatory complications: Secondary | ICD-10-CM

## 2022-03-16 ENCOUNTER — Other Ambulatory Visit: Payer: Self-pay | Admitting: Sports Medicine

## 2022-03-16 DIAGNOSIS — M1000 Idiopathic gout, unspecified site: Secondary | ICD-10-CM

## 2022-03-27 ENCOUNTER — Other Ambulatory Visit: Payer: BC Managed Care – PPO

## 2022-03-27 ENCOUNTER — Ambulatory Visit: Payer: BC Managed Care – PPO | Admitting: Family

## 2022-04-19 ENCOUNTER — Ambulatory Visit: Payer: BC Managed Care – PPO | Admitting: Sports Medicine

## 2022-05-18 ENCOUNTER — Other Ambulatory Visit: Payer: Self-pay | Admitting: Sports Medicine

## 2022-05-18 DIAGNOSIS — E1159 Type 2 diabetes mellitus with other circulatory complications: Secondary | ICD-10-CM

## 2022-05-18 DIAGNOSIS — I10 Essential (primary) hypertension: Secondary | ICD-10-CM

## 2022-06-20 ENCOUNTER — Other Ambulatory Visit: Payer: Self-pay | Admitting: Sports Medicine

## 2022-06-20 DIAGNOSIS — I10 Essential (primary) hypertension: Secondary | ICD-10-CM

## 2022-06-20 DIAGNOSIS — E1159 Type 2 diabetes mellitus with other circulatory complications: Secondary | ICD-10-CM

## 2022-06-24 ENCOUNTER — Other Ambulatory Visit: Payer: Self-pay | Admitting: Sports Medicine

## 2022-06-24 DIAGNOSIS — I10 Essential (primary) hypertension: Secondary | ICD-10-CM

## 2022-06-24 DIAGNOSIS — E1159 Type 2 diabetes mellitus with other circulatory complications: Secondary | ICD-10-CM

## 2022-07-01 ENCOUNTER — Ambulatory Visit (INDEPENDENT_AMBULATORY_CARE_PROVIDER_SITE_OTHER): Payer: BC Managed Care – PPO | Admitting: Sports Medicine

## 2022-07-01 ENCOUNTER — Encounter: Payer: Self-pay | Admitting: Sports Medicine

## 2022-07-01 ENCOUNTER — Encounter: Payer: Self-pay | Admitting: Hematology & Oncology

## 2022-07-01 VITALS — BP 134/87 | HR 84

## 2022-07-01 DIAGNOSIS — I10 Essential (primary) hypertension: Secondary | ICD-10-CM

## 2022-07-01 DIAGNOSIS — E1121 Type 2 diabetes mellitus with diabetic nephropathy: Secondary | ICD-10-CM

## 2022-07-01 DIAGNOSIS — E1149 Type 2 diabetes mellitus with other diabetic neurological complication: Secondary | ICD-10-CM

## 2022-07-01 DIAGNOSIS — E349 Endocrine disorder, unspecified: Secondary | ICD-10-CM

## 2022-07-01 DIAGNOSIS — Z0000 Encounter for general adult medical examination without abnormal findings: Secondary | ICD-10-CM

## 2022-07-01 DIAGNOSIS — Z23 Encounter for immunization: Secondary | ICD-10-CM | POA: Diagnosis not present

## 2022-07-01 DIAGNOSIS — E1159 Type 2 diabetes mellitus with other circulatory complications: Secondary | ICD-10-CM

## 2022-07-01 DIAGNOSIS — G4733 Obstructive sleep apnea (adult) (pediatric): Secondary | ICD-10-CM

## 2022-07-01 DIAGNOSIS — N5201 Erectile dysfunction due to arterial insufficiency: Secondary | ICD-10-CM

## 2022-07-01 DIAGNOSIS — M1000 Idiopathic gout, unspecified site: Secondary | ICD-10-CM

## 2022-07-01 DIAGNOSIS — I152 Hypertension secondary to endocrine disorders: Secondary | ICD-10-CM

## 2022-07-01 DIAGNOSIS — E538 Deficiency of other specified B group vitamins: Secondary | ICD-10-CM

## 2022-07-01 MED ORDER — ALLOPURINOL 300 MG PO TABS: 300.0000 mg | ORAL_TABLET | Freq: Every day | ORAL | 3 refills | Status: DC

## 2022-07-01 MED ORDER — TESTOSTERONE CYPIONATE 200 MG/ML IM SOLN: 200.0000 mg | INTRAMUSCULAR | 5 refills | Status: AC

## 2022-07-01 MED ORDER — METOPROLOL TARTRATE 100 MG PO TABS: 100.0000 mg | ORAL_TABLET | Freq: Two times a day (BID) | ORAL | 0 refills | Status: DC

## 2022-07-01 MED ORDER — VITAMIN B-12 1000 MCG PO TABS: 1000.0000 ug | ORAL_TABLET | Freq: Every day | ORAL | 3 refills | Status: AC

## 2022-07-01 MED ORDER — TADALAFIL 5 MG PO TABS: 5.0000 mg | ORAL_TABLET | Freq: Every day | ORAL | 3 refills | Status: DC | PRN

## 2022-07-01 MED ORDER — VALSARTAN-HYDROCHLOROTHIAZIDE 320-25 MG PO TABS: 1.0000 | ORAL_TABLET | Freq: Every day | ORAL | 3 refills | Status: DC

## 2022-07-01 NOTE — Assessment & Plan Note (Signed)
Intermittent with testosterone injections, he will get more consistent and then get his labs checked 1 week after his second or third injection.

## 2022-07-01 NOTE — Assessment & Plan Note (Signed)
Jonathan Oliver returns, he needs refills on some of his medications, we are going to get him caught up on some of his vaccines, Prevnar 59 as he did have pneumococcal 23 in the past and is immunocompromise with diabetes. He is also going to get Shingrix No. 1, return in 2 to 6 months for Shingrix No. 2.

## 2022-07-01 NOTE — Progress Notes (Addendum)
    Procedures performed today:    None.  Independent interpretation of notes and tests performed by another provider:   None.  Brief History, Exam, Impression, and Recommendations:    Annual physical exam Jonathan Oliver returns, he needs refills on some of his medications, we are going to get him caught up on some of his vaccines, Prevnar 25 as he did have pneumococcal 23 in the past and is immunocompromise with diabetes. He is also going to get Shingrix No. 1, return in 2 to 6 months for Shingrix No. 2.  Type 2 diabetes mellitus with neurological complications (Jonathan Oliver) Jonathan Oliver also has diabetes, we will recheck all of his labs.  He did self discontinue his Ozempic.  Hypertension associated with diabetes (Jonathan Oliver) Overall adequately controlled, no change in plan.  Mild obstructive sleep apnea with snoring Very mild obstructive sleep apnea, no treatment needed. We have discussed in the past having ENT work with him to see if he can be fit with an oral appliance to protract his mandible at night. We also restarted his testosterone.  Testosterone deficiency Intermittent with testosterone injections, he will get more consistent and then get his labs checked 1 week after his second or third injection.  Diabetic nephropathy associated with type 2 diabetes mellitus (HCC) Chronic renal insufficiency with microalbuminuria. I would like nephrology to consult.  I spent 30 minutes of total time managing this patient today, this includes chart review, face to face, and non-face to face time.  ____________________________________________ Jonathan Oliver. Jonathan Oliver, M.D., ABFM., CAQSM., AME. Primary Care and Sports Medicine [facility]  Adjunct Professor of [employer] of Medicine  Risk manager

## 2022-07-01 NOTE — Assessment & Plan Note (Signed)
Very mild obstructive sleep apnea, no treatment needed. We have discussed in the past having ENT work with him to see if he can be fit with an oral appliance to protract his mandible at night. We also restarted his testosterone.

## 2022-07-01 NOTE — Assessment & Plan Note (Signed)
Overall adequately controlled, no change in plan.

## 2022-07-01 NOTE — Telephone Encounter (Signed)
This encounter was created in error - please disregard.

## 2022-07-01 NOTE — Assessment & Plan Note (Addendum)
Jonathan Oliver also has diabetes, we will recheck all of his labs.  He did self discontinue his Ozempic.

## 2022-07-16 ENCOUNTER — Telehealth: Payer: Self-pay

## 2022-07-16 NOTE — Telephone Encounter (Addendum)
Initiated Prior authorization ITJ:[id number] Cypionate '200MG'$ /ML intramuscular solution Via: Covermymeds Case/Key:[id number] Status: approved as of 07/16/22 Reason:Authorization Expiration Date: 07/15/2025 Notified Pt via: Mychart

## 2022-08-03 ENCOUNTER — Other Ambulatory Visit: Payer: Self-pay | Admitting: Sports Medicine

## 2022-08-03 DIAGNOSIS — I10 Essential (primary) hypertension: Secondary | ICD-10-CM

## 2022-08-03 DIAGNOSIS — I152 Hypertension secondary to endocrine disorders: Secondary | ICD-10-CM

## 2022-08-07 LAB — TESTOSTERONE, FREE & TOTAL
Free Testosterone: 147.3 pg/mL (ref 35.0–155.0)
Testosterone, Total, LC-MS-MS: 1007 ng/dL (ref 250–1100)

## 2022-08-07 LAB — COMPLETE METABOLIC PANEL WITH GFR
AG Ratio: 1.4 (calc) (ref 1.0–2.5)
ALT: 34 U/L (ref 9–46)
AST: 26 U/L (ref 10–35)
Albumin: 3.9 g/dL (ref 3.6–5.1)
Alkaline phosphatase (APISO): 68 U/L (ref 35–144)
BUN/Creatinine Ratio: 13 (calc) (ref 6–22)
BUN: 19 mg/dL (ref 7–25)
CO2: 22 mmol/L (ref 20–32)
Calcium: 9.6 mg/dL (ref 8.6–10.3)
Chloride: 107 mmol/L (ref 98–110)
Creat: 1.43 mg/dL — ABNORMAL HIGH (ref 0.70–1.30)
Globulin: 2.8 g/dL (calc) (ref 1.9–3.7)
Glucose, Bld: 163 mg/dL — ABNORMAL HIGH (ref 65–99)
Potassium: 4.7 mmol/L (ref 3.5–5.3)
Sodium: 141 mmol/L (ref 135–146)
Total Bilirubin: 0.8 mg/dL (ref 0.2–1.2)
Total Protein: 6.7 g/dL (ref 6.1–8.1)
eGFR: 58 mL/min/{1.73_m2} — ABNORMAL LOW (ref >=60)

## 2022-08-07 LAB — HEMOGLOBIN A1C
Hgb A1c MFr Bld: 7.7 % of total Hgb — ABNORMAL HIGH (ref <5.7)
Mean Plasma Glucose: 174 mg/dL
eAG (mmol/L): 9.7 mmol/L

## 2022-08-07 LAB — CBC
HCT: 57 % — ABNORMAL HIGH (ref 38.5–50.0)
Hemoglobin: 20 g/dL — ABNORMAL HIGH (ref 13.2–17.1)
MCH: 31.9 pg (ref 27.0–33.0)
MCHC: 35.1 g/dL (ref 32.0–36.0)
MCV: 91.1 fL (ref 80.0–100.0)
MPV: 13.3 fL — ABNORMAL HIGH (ref 7.5–12.5)
Platelets: 155 10*3/uL (ref 140–400)
RBC: 6.26 10*6/uL — ABNORMAL HIGH (ref 4.20–5.80)
RDW: 14.4 % (ref 11.0–15.0)
WBC: 9.3 10*3/uL (ref 3.8–10.8)

## 2022-08-07 LAB — LIPID PANEL
Cholesterol: 165 mg/dL (ref <200)
HDL: 39 mg/dL — ABNORMAL LOW (ref >=40)
LDL Cholesterol (Calc): 104 mg/dL (calc) — ABNORMAL HIGH
Non-HDL Cholesterol (Calc): 126 mg/dL (calc) (ref <130)
Total CHOL/HDL Ratio: 4.2 (calc) (ref <5.0)
Triglycerides: 121 mg/dL (ref <150)

## 2022-08-07 LAB — MICROALBUMIN / CREATININE URINE RATIO
Creatinine, Urine: 136 mg/dL (ref 20–320)
Microalb Creat Ratio: 186 mcg/mg creat — ABNORMAL HIGH (ref <30)
Microalb, Ur: 25.3 mg/dL

## 2022-08-07 LAB — PSA, TOTAL AND FREE
PSA, % Free: 38 % (calc) (ref >25)
PSA, Free: 0.3 ng/mL
PSA, Total: 0.8 ng/mL (ref <=4.0)

## 2022-08-07 LAB — TSH: TSH: 1.32 mIU/L (ref 0.40–4.50)

## 2022-08-08 DIAGNOSIS — E1121 Type 2 diabetes mellitus with diabetic nephropathy: Secondary | ICD-10-CM

## 2022-08-08 HISTORY — DX: Type 2 diabetes mellitus with diabetic nephropathy: E11.21

## 2022-08-08 NOTE — Assessment & Plan Note (Signed)
Chronic renal insufficiency with microalbuminuria. I would like nephrology to consult.

## 2022-08-08 NOTE — Addendum Note (Signed)
Addended by: Silverio Decamp on: 08/08/2022 09:35 AM   Modules accepted: Orders

## 2022-08-09 ENCOUNTER — Encounter: Payer: Self-pay | Admitting: Hematology & Oncology

## 2022-08-09 ENCOUNTER — Encounter: Payer: Self-pay | Admitting: Sports Medicine

## 2022-08-09 ENCOUNTER — Telehealth (INDEPENDENT_AMBULATORY_CARE_PROVIDER_SITE_OTHER): Payer: BC Managed Care – PPO | Admitting: Sports Medicine

## 2022-08-09 VITALS — Wt 250.0 lb

## 2022-08-09 DIAGNOSIS — E1149 Type 2 diabetes mellitus with other diabetic neurological complication: Secondary | ICD-10-CM

## 2022-08-09 DIAGNOSIS — E1121 Type 2 diabetes mellitus with diabetic nephropathy: Secondary | ICD-10-CM

## 2022-08-09 DIAGNOSIS — E1159 Type 2 diabetes mellitus with other circulatory complications: Secondary | ICD-10-CM | POA: Diagnosis not present

## 2022-08-09 DIAGNOSIS — I152 Hypertension secondary to endocrine disorders: Secondary | ICD-10-CM

## 2022-08-09 MED ORDER — OZEMPIC (0.25 OR 0.5 MG/DOSE) 2 MG/1.5ML ~~LOC~~ SOPN: 0.2500 mg | PEN_INJECTOR | SUBCUTANEOUS | 11 refills | Status: DC

## 2022-08-09 MED ORDER — AMLODIPINE BESYLATE 5 MG PO TABS: 5.0000 mg | ORAL_TABLET | Freq: Every day | ORAL | 3 refills | Status: DC

## 2022-08-09 NOTE — Assessment & Plan Note (Signed)
Blood pressure has been looking elevated, I did refill his amlodipine.

## 2022-08-09 NOTE — Progress Notes (Signed)
   Virtual Visit via WebEx/MyChart   I connected with  Jonathan Oliver.  on 08/09/22 via WebEx/MyChart/Doximity Video and verified that I am speaking with the correct person using two identifiers.   I discussed the limitations, risks, security and privacy concerns of performing an evaluation and management service by WebEx/MyChart/Doximity Video, including the higher likelihood of inaccurate diagnosis and treatment, and the availability of in person appointments.  We also discussed the likely need of an additional face to face encounter for complete and high quality delivery of care.  I also discussed with the patient that there may be a patient responsible charge related to this service. The patient expressed understanding and wishes to proceed.  Provider location is in medical facility. Patient location is at their home, different from provider location. People involved in care of the patient during this telehealth encounter were myself, my nurse/medical assistant, and my front office/scheduling team member.  Review of Systems: No fevers, chills, night sweats, weight loss, chest pain, or shortness of breath.   Objective Findings:    General: Speaking full sentences, no audible heavy breathing.  Sounds alert and appropriately interactive.  Appears well.  Face symmetric.  Extraocular movements intact.  Pupils equal and round.  No nasal flaring or accessory muscle use visualized.  Independent interpretation of tests performed by another provider:   None.  Brief History, Exam, Impression, and Recommendations:    Hypertension associated with diabetes (Jonathan Oliver) Blood pressure has been looking elevated, I did refill his amlodipine.  Type 2 diabetes mellitus with neurological complications (HCC) Jonathan Oliver hemoglobin A1c was up to 7.7% up from 5.5% at the previous visit, at the previous visit because it was looking so good change the disc and his Ozempic. As he has worsened I will go ahead and restart  Ozempic. We do need to avoid point-of-care hemoglobin A1c's due to Jonathan Oliver's hemoglobin levels.   I discussed the above assessment and treatment plan with the patient. The patient was provided an opportunity to ask questions and all were answered. The patient agreed with the plan and demonstrated an understanding of the instructions.   The patient was advised to call back or seek an in-person evaluation if the symptoms worsen or if the condition fails to improve as anticipated.   I provided 30 minutes of face to face and non-face-to-face time during this encounter date, time was needed to gather information, review chart, records, communicate/coordinate with staff remotely, as well as complete documentation.   ____________________________________________ Jonathan Oliver. Jonathan Oliver, M.D., ABFM., CAQSM., AME. Primary Care and Sports Medicine [facility]  Adjunct Professor of [employer] [employer] [facility] [employer] of Medicine  Risk manager

## 2022-08-09 NOTE — Assessment & Plan Note (Signed)
Shahin hemoglobin A1c was up to 7.7% up from 5.5% at the previous visit, at the previous visit because it was looking so good change the disc and his Ozempic. As he has worsened I will go ahead and restart Ozempic. We do need to avoid point-of-care hemoglobin A1c's due to Cornie's hemoglobin levels.

## 2022-08-12 ENCOUNTER — Inpatient Hospital Stay: Payer: BC Managed Care – PPO

## 2022-08-12 ENCOUNTER — Inpatient Hospital Stay (HOSPITAL_BASED_OUTPATIENT_CLINIC_OR_DEPARTMENT_OTHER): Payer: BC Managed Care – PPO | Admitting: Family

## 2022-08-12 ENCOUNTER — Inpatient Hospital Stay: Payer: BC Managed Care – PPO | Attending: Hematology & Oncology

## 2022-08-12 ENCOUNTER — Other Ambulatory Visit: Payer: Self-pay

## 2022-08-12 ENCOUNTER — Encounter: Payer: Self-pay | Admitting: Family

## 2022-08-12 DIAGNOSIS — D751 Secondary polycythemia: Secondary | ICD-10-CM | POA: Diagnosis not present

## 2022-08-12 DIAGNOSIS — Z7982 Long term (current) use of aspirin: Secondary | ICD-10-CM | POA: Insufficient documentation

## 2022-08-12 DIAGNOSIS — G629 Polyneuropathy, unspecified: Secondary | ICD-10-CM | POA: Diagnosis not present

## 2022-08-12 LAB — CMP (CANCER CENTER ONLY)
ALT: 32 U/L (ref 0–44)
AST: 27 U/L (ref 15–41)
Albumin: 3.9 g/dL (ref 3.5–5.0)
Alkaline Phosphatase: 76 U/L (ref 38–126)
Anion gap: 9 (ref 5–15)
BUN: 28 mg/dL — ABNORMAL HIGH (ref 6–20)
CO2: 26 mmol/L (ref 22–32)
Calcium: 9.7 mg/dL (ref 8.9–10.3)
Chloride: 100 mmol/L (ref 98–111)
Creatinine: 1.79 mg/dL — ABNORMAL HIGH (ref 0.61–1.24)
GFR, Estimated: 44 mL/min — ABNORMAL LOW (ref >60)
Glucose, Bld: 157 mg/dL — ABNORMAL HIGH (ref 70–99)
Potassium: 4.2 mmol/L (ref 3.5–5.1)
Sodium: 135 mmol/L (ref 135–145)
Total Bilirubin: 1 mg/dL (ref 0.3–1.2)
Total Protein: 7 g/dL (ref 6.5–8.1)

## 2022-08-12 LAB — CBC WITH DIFFERENTIAL (CANCER CENTER ONLY)
Abs Immature Granulocytes: 0.09 10*3/uL — ABNORMAL HIGH (ref 0.00–0.07)
Basophils Absolute: 0.1 10*3/uL (ref 0.0–0.1)
Basophils Relative: 1 %
Eosinophils Absolute: 0.3 10*3/uL (ref 0.0–0.5)
Eosinophils Relative: 3 %
HCT: 59.2 % — ABNORMAL HIGH (ref 39.0–52.0)
Hemoglobin: 20.1 g/dL — ABNORMAL HIGH (ref 13.0–17.0)
Immature Granulocytes: 1 %
Lymphocytes Relative: 17 %
Lymphs Abs: 1.7 10*3/uL (ref 0.7–4.0)
MCH: 31.7 pg (ref 26.0–34.0)
MCHC: 34 g/dL (ref 30.0–36.0)
MCV: 93.2 fL (ref 80.0–100.0)
Monocytes Absolute: 1.3 10*3/uL — ABNORMAL HIGH (ref 0.1–1.0)
Monocytes Relative: 13 %
Neutro Abs: 6.1 10*3/uL (ref 1.7–7.7)
Neutrophils Relative %: 65 %
Platelet Count: 194 10*3/uL (ref 150–400)
RBC: 6.35 MIL/uL — ABNORMAL HIGH (ref 4.22–5.81)
RDW: 14.6 % (ref 11.5–15.5)
WBC Count: 9.6 10*3/uL (ref 4.0–10.5)
nRBC: 0 % (ref 0.0–0.2)

## 2022-08-12 LAB — FERRITIN: Ferritin: 23 ng/mL — ABNORMAL LOW (ref 24–336)

## 2022-08-12 NOTE — Patient Instructions (Signed)

## 2022-08-12 NOTE — Progress Notes (Signed)
Hematology and Oncology Follow Up Visit  Jonathan Oliver [id number] 22-Apr-1966 57 y.o. 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 Jonathan Oliver is here today for follow-up. He is doing fairly well but has noted fatigue, mild SOB with over exertion, increased joint aches and pains and the occasional headache.  He visited his PCP recently and Hgb was clear up to 20. Today Hgb is 20.1 and Hct 59%.  He is taking his baby aspirin daily as recommended.  He states that he has not used his testosterone supplement in months.  He is not a smoker.  No blood loss, bruising or petechiae noted.  No swelling in his extremities.  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  No falls or syncope reported.  Appetite and hydration are good. Weight is 265 lbs.   ECOG Performance Status: 1 - Symptomatic but completely ambulatory  Medications:  Allergies as of 08/12/2022   No Known Allergies      Medication List        Accurate as of August 12, 2022  2:26 PM. If you have any questions, ask your nurse or doctor.          Accu-Chek Guide test strip Generic drug: glucose blood TEST 1-4 TIMES DAILY   Accu-Chek Softclix Lancets lancets TEST 1-4 TIMES DAILY   Adult Blood Pressure Cuff Lg Kit Use as needed   allopurinol 300 MG tablet Commonly known as: ZYLOPRIM Take 1 tablet (300 mg total) by mouth daily.   AMBULATORY NON FORMULARY MEDICATION Single glucometer with lancets, test strips Test daily.  Disp qs x 3 months. E11.49   AMBULATORY NON FORMULARY MEDICATION Single glucometer with lancets, test strips, patient to test 1-4 times daily.   amLODipine 5 MG tablet Commonly known as: NORVASC Take 1 tablet (5 mg total) by mouth daily.   cyanocobalamin 1000 MCG  tablet Commonly known as: VITAMIN B12 Take 1 tablet (1,000 mcg total) by mouth daily.   metoprolol tartrate 100 MG tablet Commonly known as: LOPRESSOR TAKE 1 TABLET BY MOUTH TWICE A DAY   Ozempic (0.25 or 0.5 MG/DOSE) 2 MG/1.5ML Sopn Generic drug: Semaglutide(0.25 or 0.5MG/DOS) Inject 0.25 mg into the skin once a week.   tadalafil 5 MG tablet Commonly known as: CIALIS Take 1 tablet (5 mg total) by mouth daily as needed for erectile dysfunction.   testosterone cypionate 200 MG/ML injection Commonly known as: DEPOTESTOSTERONE CYPIONATE Inject 1 mL (200 mg total) into the muscle every 14 (fourteen) days.   valsartan-hydrochlorothiazide 320-25 MG tablet Commonly known as: DIOVAN-HCT Take 1 tablet by mouth daily.        Allergies: No Known Allergies  Past Medical History, Surgical history, Social history, and Family History were reviewed and updated.  Review of Systems: All other 10 point review of systems is negative.   Physical Exam:  vitals were not taken for this visit.   Wt Readings from Last 3 Encounters:  08/09/22 250 lb (113.4 kg)  01/25/22 249 lb (112.9 kg)  11/30/21 253 lb (114.8 kg)    Ocular: Sclerae unicteric, pupils equal, round and reactive to light Ear-nose-throat: Oropharynx clear, dentition fair Lymphatic: No cervical or supraclavicular adenopathy Lungs no rales or rhonchi, good excursion bilaterally Heart regular rate and rhythm, no  murmur appreciated Abd soft, nontender, positive bowel sounds MSK no focal spinal tenderness, no joint edema Neuro: non-focal, well-oriented, appropriate affect Breasts: Deferred   Lab Results  Component Value Date   WBC 9.3 08/02/2022   HGB 20.0 (H) 08/02/2022   HCT 57.0 (H) 08/02/2022   MCV 91.1 08/02/2022   PLT 155 08/02/2022   Lab Results  Component Value Date   FERRITIN 44 01/25/2022   IRON 147 01/25/2022   TIBC 445 01/25/2022   UIBC 298 01/25/2022   IRONPCTSAT 33 01/25/2022   Lab Results  Component  Value Date   RETICCTPCT 2.0 02/20/2021   RBC 6.26 (H) 08/02/2022   No results found for: "KPAFRELGTCHN", "LAMBDASER", "KAPLAMBRATIO" No results found for: "IGGSERUM", "IGA", "IGMSERUM" No results found for: "TOTALPROTELP", "ALBUMINELP", "A1GS", "A2GS", "BETS", "BETA2SER", "GAMS", "MSPIKE", "SPEI"   Chemistry      Component Value Date/Time   NA 141 08/02/2022 0833   NA 142 06/18/2017 1425   K 4.7 08/02/2022 0833   K 4.1 06/18/2017 1425   CL 107 08/02/2022 0833   CL 103 06/18/2017 1425   CO2 22 08/02/2022 0833   CO2 26 06/18/2017 1425   BUN 19 08/02/2022 0833   BUN 24 (H) 06/18/2017 1425   CREATININE 1.43 (H) 08/02/2022 0833      Component Value Date/Time   CALCIUM 9.6 08/02/2022 0833   CALCIUM 10.0 06/18/2017 1425   ALKPHOS 62 01/25/2022 1014   ALKPHOS 95 (H) 06/18/2017 1425   AST 26 08/02/2022 0833   AST 28 01/25/2022 1014   ALT 34 08/02/2022 0833   ALT 32 01/25/2022 1014   ALT 63 (H) 06/18/2017 1425   BILITOT 0.8 08/02/2022 0833   BILITOT 1.1 01/25/2022 1014       Impression and Plan: Jonathan Oliver is a very pleasant 57 yo caucasian gentleman with hemochromatosis, heterozygous for the H63D mutation. We will proceed with phlebotomy today and again next week.  Iron studies are pending. If we need to extend his phlebotomy plan, we will.  Lab check monthly. Follow-up in 2 months.   Lottie Dawson, NP 2/19/20242:26 PM

## 2022-08-12 NOTE — Progress Notes (Signed)
Jonathan Oliver. presents today for phlebotomy per MD orders. Phlebotomy procedure started at 1455 and ended at 1510. 405 cc removed via 16 G needle at L AC site, whe the blood flow stopped. Patient refused a second stick. Stated that he is sweating. Patient is diabetic and was offered orange juice.After drinking the juice he felt "much better". He refused to wait 30 minutees after phlebotomy and was discharged after 15 minutes of observation. Stable and ASX at discharge.

## 2022-08-13 ENCOUNTER — Encounter: Payer: Self-pay | Admitting: Hematology & Oncology

## 2022-08-13 LAB — IRON AND IRON BINDING CAPACITY (CC-WL,HP ONLY)
Iron: 118 ug/dL (ref 45–182)
Saturation Ratios: 27 % (ref 17.9–39.5)
TIBC: 437 ug/dL (ref 250–450)
UIBC: 319 ug/dL (ref 117–376)

## 2022-08-19 ENCOUNTER — Inpatient Hospital Stay: Payer: BC Managed Care – PPO

## 2022-08-19 VITALS — BP 105/71 | HR 82 | Temp 98.3°F | Resp 17

## 2022-08-19 DIAGNOSIS — D751 Secondary polycythemia: Secondary | ICD-10-CM

## 2022-08-19 NOTE — Patient Instructions (Signed)

## 2022-08-19 NOTE — Progress Notes (Signed)
Jonathan Oliver. presents today for phlebotomy per MD orders. Phlebotomy procedure started at 1412 and ended at 1420. 525 grams removed via 16 gauge needle to the left AC using phlebotomy kit. Pt declined to stay for post procedure observation period. Pt stated he has tolerated procedure multiple times prior without difficulty. Pt aware to call clinic with any questions or concerns. Pt verbalized understanding and had no further questions.  IV needle removed intact.

## 2022-08-21 ENCOUNTER — Telehealth: Payer: BC Managed Care – PPO | Admitting: Nurse Practitioner

## 2022-08-21 DIAGNOSIS — J069 Acute upper respiratory infection, unspecified: Secondary | ICD-10-CM | POA: Diagnosis not present

## 2022-08-21 MED ORDER — FLUTICASONE PROPIONATE 50 MCG/ACT NA SUSP: 2.0000 | Freq: Every day | NASAL | 6 refills | Status: DC

## 2022-08-21 MED ORDER — PROMETHAZINE-DM 6.25-15 MG/5ML PO SYRP: 5.0000 mL | ORAL_SOLUTION | Freq: Four times a day (QID) | ORAL | 0 refills | Status: DC | PRN

## 2022-08-21 NOTE — Patient Instructions (Signed)
[name], thank you for joining Chevis Pretty, FNP for today's virtual visit.  While this provider is not your primary care provider (PCP), if your PCP is located in our provider database this encounter information will be shared with them immediately following your visit.   [facility] account gives you access to today's visit and all your visits, tests, and labs performed at [facility] " click here if you don't have a [facility] account or go to [email or web address]  Consent: (Patient) Jonathan Oliver. provided verbal consent for this virtual visit at the beginning of the encounter.  Current Medications:  Current Outpatient Medications:    fluticasone (FLONASE) 50 MCG/ACT nasal spray, Place 2 sprays into both nostrils daily., Disp: 16 g, Rfl: 6   promethazine-dextromethorphan (PROMETHAZINE-DM) 6.25-15 MG/5ML syrup, Take 5 mLs by mouth 4 (four) times daily as needed for cough., Disp: 118 mL, Rfl: 0   ACCU-CHEK GUIDE test strip, TEST 1-4 TIMES DAILY, Disp: 100 strip, Rfl: 11   Accu-Chek Softclix Lancets lancets, TEST 1-4 TIMES DAILY, Disp: 200 each, Rfl: 11   allopurinol (ZYLOPRIM) 300 MG tablet, Take 1 tablet (300 mg total) by mouth daily., Disp: 90 tablet, Rfl: 3   AMBULATORY NON FORMULARY MEDICATION, Single glucometer with lancets, test strips Test daily.  Disp qs x 3 months. E11.49, Disp: 1 each, Rfl: 4   AMBULATORY NON FORMULARY MEDICATION, Single glucometer with lancets, test strips, patient to test 1-4 times daily., Disp: 1 each, Rfl: 0   amLODipine (NORVASC) 5 MG tablet, Take 1 tablet (5 mg total) by mouth daily., Disp: 90 tablet, Rfl: 3   Blood Pressure Monitoring (ADULT BLOOD PRESSURE CUFF LG) KIT, Use as needed, Disp: 1 kit, Rfl: 0   cyanocobalamin (VITAMIN B12) 1000 MCG tablet, Take 1 tablet (1,000 mcg total) by mouth daily., Disp: 90 tablet, Rfl: 3   metoprolol tartrate (LOPRESSOR) 100 MG tablet, TAKE 1 TABLET BY MOUTH TWICE A  DAY, Disp: 60 tablet, Rfl: 0   Semaglutide,0.25 or 0.'5MG'$ /DOS, (OZEMPIC, 0.25 OR 0.5 MG/DOSE,) 2 MG/1.5ML SOPN, Inject 0.25 mg into the skin once a week., Disp: 3 mL, Rfl: 11   tadalafil (CIALIS) 5 MG tablet, Take 1 tablet (5 mg total) by mouth daily as needed for erectile dysfunction., Disp: 90 tablet, Rfl: 3   testosterone cypionate (DEPOTESTOSTERONE CYPIONATE) 200 MG/ML injection, Inject 1 mL (200 mg total) into the muscle every 14 (fourteen) days., Disp: 2 mL, Rfl: 5   valsartan-hydrochlorothiazide (DIOVAN-HCT) 320-25 MG tablet, Take 1 tablet by mouth daily., Disp: 90 tablet, Rfl: 3   Medications ordered in this encounter:  Meds ordered this encounter  Medications   fluticasone (FLONASE) 50 MCG/ACT nasal spray    Sig: Place 2 sprays into both nostrils daily.    Dispense:  16 g    Refill:  6    Order Specific Question:   Supervising Provider    Answer:   Chase Picket [id number]   promethazine-dextromethorphan (PROMETHAZINE-DM) 6.25-15 MG/5ML syrup    Sig: Take 5 mLs by mouth 4 (four) times daily as needed for cough.    Dispense:  118 mL    Refill:  0    Order Specific Question:   Supervising Provider    Answer:   Chase Picket [id number]     *If you need refills on other medications prior to your next appointment, please contact your pharmacy*  Follow-Up: Call back or seek an in-person evaluation if the symptoms worsen or if the condition  fails to improve as anticipated.  [place] [phone]  Other Instructions 1. Take meds as prescribed 2. Use a cool mist humidifier especially during the winter months and when heat has been humid. 3. Use saline nose sprays frequently 4. Saline irrigations of the nose can be very helpful if done frequently.  * 4X daily for 1 week*  * Use of a nettie pot can be helpful with this. Follow directions with this* 5. Drink plenty of fluids 6. Keep thermostat turn down low 7.For any cough or congestion- promethazine DM 8.  For fever or aces or pains- take tylenol or ibuprofen appropriate for age and weight.  * for fevers greater than 101 orally you may alternate ibuprofen and tylenol every  3 hours.       If you have been instructed to have an in-person evaluation today at a local Urgent Care facility, please use the link below. It will take you to a list of all of our available [facility], including address, phone number and hours of operation. Please do not delay care.  [facility]  If you or a family member do not have a primary care provider, use the link below to schedule a visit and establish care. When you choose a [facility] primary care physician or advanced practice provider, you gain a long-term partner in health. Find a Primary Care Provider  Learn more about [facility] in-office and virtual care options: [facility]

## 2022-08-21 NOTE — Progress Notes (Signed)
Virtual Visit Consent   [name], you are scheduled for a virtual visit with [name], [place], a [facility] provider, today.     Just as with appointments in the office, your consent must be obtained to participate.  Your consent will be active for this visit and any virtual visit you may have with one of our providers in the next 365 days.     If you have a MyChart account, a copy of this consent can be sent to you electronically.  All virtual visits are billed to your insurance company just like a traditional visit in the office.    As this is a virtual visit, video technology does not allow for your provider to perform a traditional examination.  This may limit your provider's ability to fully assess your condition.  If your provider identifies any concerns that need to be evaluated in person or the need to arrange testing (such as labs, EKG, etc.), we will make arrangements to do so.     Although advances in technology are sophisticated, we cannot ensure that it will always work on either your end or our end.  If the connection with a video visit is poor, the visit may have to be switched to a telephone visit.  With either a video or telephone visit, we are not always able to ensure that we have a secure connection.     I need to obtain your verbal consent now.   Are you willing to proceed with your visit today? YES   Zohair Stooksbury. has provided verbal consent on 08/21/2022 for a virtual visit (video or telephone).   [name], FNP   Date: 08/21/2022 9:05 AM   Virtual Visit via Video Note   I, [name], connected with [name]. ([id number], [birth year]) on 08/21/22 at  9:15 AM EST by a video-enabled telemedicine application and verified that I am speaking with the correct person using two identifiers.  Location: Patient: Virtual Visit Location Patient: Home Provider: Virtual Visit Location Provider: Mobile   I discussed the  limitations of evaluation and management by telemedicine and the availability of in person appointments. The patient expressed understanding and agreed to proceed.    History of Present Illness: Jonathan Oliver. is a 57 y.o. who identifies as a male who was assigned male at birth, and is being seen today for flu like symptoms.  HPI: URI  This is a new problem. The current episode started in the past 7 days. The problem has been waxing and waning. There has been no fever. Associated symptoms include congestion, coughing, headaches, rhinorrhea and a sore throat. He has tried acetaminophen for the symptoms. The treatment provided mild relief.    Review of Systems  HENT:  Positive for congestion, rhinorrhea and sore throat.   Respiratory:  Positive for cough.   Neurological:  Positive for headaches.    Problems:  Patient Active Problem List   Diagnosis Date Noted   Diabetic nephropathy associated with type 2 diabetes mellitus ([place]) 08/08/2022   Excessive daytime sleepiness 08/02/2021   Allergic rhinitis 01/03/2020   Plantar wart of right foot 05/07/2019   Plantar fasciitis, right 05/07/2019   Positive colorectal cancer screening using Cologuard test 01/27/2019   Type 2 diabetes mellitus with neurological complications ([place]) 123456   Onychomycosis 07/03/2018   Obesity 07/03/2018   Excessive drinking alcohol 07/03/2018   Myopia of both eyes 05/24/2016   Headache syndrome 05/08/2016   Annual physical exam  07/07/2015   Erectile dysfunction due to arterial insufficiency 12/23/2014   Left knee pain 02/18/2014   Mild obstructive sleep apnea with snoring 01/17/2014   Erythrocytosis 04/23/2013   Lateral epicondylitis of both elbows 04/23/2013   LPRD (laryngopharyngeal reflux disease) 02/26/2013   Elevated serum creatinine 02/06/2012   Elbow mass 01/31/2012   Testosterone deficiency 11/03/2010   B12 deficiency 11/03/2010   History of nephrolithiasis 07/20/2010   POLYCYTHEMIA 06/15/2010    Hypertension associated with diabetes ([place]) 11/24/2006   Gout 11/06/2006    Allergies: No Known Allergies Medications:  Current Outpatient Medications:    ACCU-CHEK GUIDE test strip, TEST 1-4 TIMES DAILY, Disp: 100 strip, Rfl: 11   Accu-Chek Softclix Lancets lancets, TEST 1-4 TIMES DAILY, Disp: 200 each, Rfl: 11   allopurinol (ZYLOPRIM) 300 MG tablet, Take 1 tablet (300 mg total) by mouth daily., Disp: 90 tablet, Rfl: 3   AMBULATORY NON FORMULARY MEDICATION, Single glucometer with lancets, test strips Test daily.  Disp qs x 3 months. E11.49, Disp: 1 each, Rfl: 4   AMBULATORY NON FORMULARY MEDICATION, Single glucometer with lancets, test strips, patient to test 1-4 times daily., Disp: 1 each, Rfl: 0   amLODipine (NORVASC) 5 MG tablet, Take 1 tablet (5 mg total) by mouth daily., Disp: 90 tablet, Rfl: 3   Blood Pressure Monitoring (ADULT BLOOD PRESSURE CUFF LG) KIT, Use as needed, Disp: 1 kit, Rfl: 0   cyanocobalamin (VITAMIN B12) 1000 MCG tablet, Take 1 tablet (1,000 mcg total) by mouth daily., Disp: 90 tablet, Rfl: 3   metoprolol tartrate (LOPRESSOR) 100 MG tablet, TAKE 1 TABLET BY MOUTH TWICE A DAY, Disp: 60 tablet, Rfl: 0   Semaglutide,0.25 or 0.'5MG'$ /DOS, (OZEMPIC, 0.25 OR 0.5 MG/DOSE,) 2 MG/1.5ML SOPN, Inject 0.25 mg into the skin once a week., Disp: 3 mL, Rfl: 11   tadalafil (CIALIS) 5 MG tablet, Take 1 tablet (5 mg total) by mouth daily as needed for erectile dysfunction., Disp: 90 tablet, Rfl: 3   testosterone cypionate (DEPOTESTOSTERONE CYPIONATE) 200 MG/ML injection, Inject 1 mL (200 mg total) into the muscle every 14 (fourteen) days., Disp: 2 mL, Rfl: 5   valsartan-hydrochlorothiazide (DIOVAN-HCT) 320-25 MG tablet, Take 1 tablet by mouth daily., Disp: 90 tablet, Rfl: 3  Observations/Objective: Patient is well-developed, well-nourished in no acute distress.  Resting comfortably  at home.  Head is normocephalic, atraumatic.  No labored breathing.  Speech is clear and coherent with  logical content.  Patient is alert and oriented at baseline.  Dry cough   Assessment and Plan:  [name] in today with chief complaint of flu like   1. URI with cough and congestion 1. Take meds as prescribed 2. Use a cool mist humidifier especially during the winter months and when heat has been humid. 3. Use saline nose sprays frequently 4. Saline irrigations of the nose can be very helpful if done frequently.  * 4X daily for 1 week*  * Use of a nettie pot can be helpful with this. Follow directions with this* 5. Drink plenty of fluids 6. Keep thermostat turn down low 7.For any cough or congestion- promethazine DM 8. For fever or aces or pains- take tylenol or ibuprofen appropriate for age and weight.  * for fevers greater than 101 orally you may alternate ibuprofen and tylenol every  3 hours.   Meds ordered this encounter  Medications   fluticasone (FLONASE) 50 MCG/ACT nasal spray    Sig: Place 2 sprays into both nostrils daily.    Dispense:  16 g    Refill:  6    Order Specific Question:   Supervising Provider    Answer:   Chase Picket [id number]   promethazine-dextromethorphan (PROMETHAZINE-DM) 6.25-15 MG/5ML syrup    Sig: Take 5 mLs by mouth 4 (four) times daily as needed for cough.    Dispense:  118 mL    Refill:  0    Order Specific Question:   Supervising Provider    Answer:   Chase Picket [id number]       Follow Up Instructions: I discussed the assessment and treatment plan with the patient. The patient was provided an opportunity to ask questions and all were answered. The patient agreed with the plan and demonstrated an understanding of the instructions.  A copy of instructions were sent to the patient via MyChart.  The patient was advised to call back or seek an in-person evaluation if the symptoms worsen or if the condition fails to improve as anticipated.  Time:  I spent 6 minutes with the patient via telehealth technology discussing the  above problems/concerns.    [name], FNP

## 2022-08-26 ENCOUNTER — Inpatient Hospital Stay: Payer: BC Managed Care – PPO | Attending: Hematology & Oncology

## 2022-08-26 VITALS — BP 107/77 | HR 62 | Temp 97.9°F | Resp 17

## 2022-08-26 DIAGNOSIS — D751 Secondary polycythemia: Secondary | ICD-10-CM

## 2022-08-26 NOTE — Patient Instructions (Signed)

## 2022-08-26 NOTE — Progress Notes (Signed)
Jonathan Oliver. presents today for phlebotomy per MD orders. Phlebotomy procedure started at 1359 and ended at 1410. 515 grams removed via 16 gauge needle to left AC.  Pt declined to stay for post procedure observation period. Pt stated he has tolerated procedure multiple times prior without difficulty. Pt aware to call clinic with any questions or concerns. Pt verbalized understanding and had no further questions.  Patient tolerated procedure well and given juice post procedure IV needle removed intact.

## 2022-08-29 ENCOUNTER — Other Ambulatory Visit: Payer: Self-pay | Admitting: Sports Medicine

## 2022-08-29 DIAGNOSIS — I10 Essential (primary) hypertension: Secondary | ICD-10-CM

## 2022-08-29 DIAGNOSIS — E1159 Type 2 diabetes mellitus with other circulatory complications: Secondary | ICD-10-CM

## 2022-09-02 ENCOUNTER — Other Ambulatory Visit: Payer: Self-pay | Admitting: Sports Medicine

## 2022-09-02 DIAGNOSIS — I152 Hypertension secondary to endocrine disorders: Secondary | ICD-10-CM

## 2022-09-02 DIAGNOSIS — I10 Essential (primary) hypertension: Secondary | ICD-10-CM

## 2022-09-09 ENCOUNTER — Inpatient Hospital Stay: Payer: BC Managed Care – PPO

## 2022-09-09 DIAGNOSIS — D751 Secondary polycythemia: Secondary | ICD-10-CM

## 2022-09-09 LAB — CBC WITH DIFFERENTIAL (CANCER CENTER ONLY)
Abs Immature Granulocytes: 0.04 10*3/uL (ref 0.00–0.07)
Basophils Absolute: 0.1 10*3/uL (ref 0.0–0.1)
Basophils Relative: 1 %
Eosinophils Absolute: 0.2 10*3/uL (ref 0.0–0.5)
Eosinophils Relative: 3 %
HCT: 53.2 % — ABNORMAL HIGH (ref 39.0–52.0)
Hemoglobin: 17.8 g/dL — ABNORMAL HIGH (ref 13.0–17.0)
Immature Granulocytes: 1 %
Lymphocytes Relative: 18 %
Lymphs Abs: 1.5 10*3/uL (ref 0.7–4.0)
MCH: 31.3 pg (ref 26.0–34.0)
MCHC: 33.5 g/dL (ref 30.0–36.0)
MCV: 93.7 fL (ref 80.0–100.0)
Monocytes Absolute: 1.1 10*3/uL — ABNORMAL HIGH (ref 0.1–1.0)
Monocytes Relative: 13 %
Neutro Abs: 5.5 10*3/uL (ref 1.7–7.7)
Neutrophils Relative %: 64 %
Platelet Count: 206 10*3/uL (ref 150–400)
RBC: 5.68 MIL/uL (ref 4.22–5.81)
RDW: 14.1 % (ref 11.5–15.5)
WBC Count: 8.4 10*3/uL (ref 4.0–10.5)
nRBC: 0 % (ref 0.0–0.2)

## 2022-09-09 LAB — CMP (CANCER CENTER ONLY)
ALT: 38 U/L (ref 0–44)
AST: 29 U/L (ref 15–41)
Albumin: 4.1 g/dL (ref 3.5–5.0)
Alkaline Phosphatase: 80 U/L (ref 38–126)
Anion gap: 8 (ref 5–15)
BUN: 23 mg/dL — ABNORMAL HIGH (ref 6–20)
CO2: 26 mmol/L (ref 22–32)
Calcium: 9.9 mg/dL (ref 8.9–10.3)
Chloride: 105 mmol/L (ref 98–111)
Creatinine: 1.47 mg/dL — ABNORMAL HIGH (ref 0.61–1.24)
GFR, Estimated: 56 mL/min — ABNORMAL LOW (ref >60)
Glucose, Bld: 91 mg/dL (ref 70–99)
Potassium: 4.4 mmol/L (ref 3.5–5.1)
Sodium: 139 mmol/L (ref 135–145)
Total Bilirubin: 0.7 mg/dL (ref 0.3–1.2)
Total Protein: 7.1 g/dL (ref 6.5–8.1)

## 2022-09-09 LAB — FERRITIN: Ferritin: 19 ng/mL — ABNORMAL LOW (ref 24–336)

## 2022-09-09 NOTE — Progress Notes (Signed)
Jonathan Oliver. presents today for phlebotomy per MD orders. Phlebotomy procedure started at 1424 and ended at 1532. 511 grams removed via 14 gauge needle using a phlebotomy kit. Patient observed for 20 minutes after procedure without any incident. Pt requested to leave prior to full observation period and stated he has tolerated procedures well without difficulty prior. Patient tolerated procedure well. IV needle removed intact. Pt declined any snacks.

## 2022-09-09 NOTE — Patient Instructions (Signed)

## 2022-09-10 LAB — IRON AND IRON BINDING CAPACITY (CC-WL,HP ONLY)
Iron: 75 ug/dL (ref 45–182)
Saturation Ratios: 17 % — ABNORMAL LOW (ref 17.9–39.5)
TIBC: 430 ug/dL (ref 250–450)
UIBC: 355 ug/dL (ref 117–376)

## 2022-10-07 ENCOUNTER — Inpatient Hospital Stay: Payer: BC Managed Care – PPO

## 2022-10-07 ENCOUNTER — Encounter: Payer: Self-pay | Admitting: Family

## 2022-10-07 ENCOUNTER — Inpatient Hospital Stay: Payer: BC Managed Care – PPO | Attending: Family | Admitting: Family

## 2022-10-07 VITALS — BP 107/75 | HR 69 | Temp 98.6°F | Resp 18 | Wt 256.8 lb

## 2022-10-07 DIAGNOSIS — D751 Secondary polycythemia: Secondary | ICD-10-CM

## 2022-10-07 DIAGNOSIS — Z7982 Long term (current) use of aspirin: Secondary | ICD-10-CM | POA: Insufficient documentation

## 2022-10-07 LAB — CBC WITH DIFFERENTIAL (CANCER CENTER ONLY)
Abs Immature Granulocytes: 0.06 10*3/uL (ref 0.00–0.07)
Basophils Absolute: 0.1 10*3/uL (ref 0.0–0.1)
Basophils Relative: 1 %
Eosinophils Absolute: 0.3 10*3/uL (ref 0.0–0.5)
Eosinophils Relative: 3 %
HCT: 52.9 % — ABNORMAL HIGH (ref 39.0–52.0)
Hemoglobin: 17.7 g/dL — ABNORMAL HIGH (ref 13.0–17.0)
Immature Granulocytes: 1 %
Lymphocytes Relative: 17 %
Lymphs Abs: 1.6 10*3/uL (ref 0.7–4.0)
MCH: 30.2 pg (ref 26.0–34.0)
MCHC: 33.5 g/dL (ref 30.0–36.0)
MCV: 90.3 fL (ref 80.0–100.0)
Monocytes Absolute: 1.3 10*3/uL — ABNORMAL HIGH (ref 0.1–1.0)
Monocytes Relative: 14 %
Neutro Abs: 6.1 10*3/uL (ref 1.7–7.7)
Neutrophils Relative %: 64 %
Platelet Count: 249 10*3/uL (ref 150–400)
RBC: 5.86 MIL/uL — ABNORMAL HIGH (ref 4.22–5.81)
RDW: 13.7 % (ref 11.5–15.5)
WBC Count: 9.4 10*3/uL (ref 4.0–10.5)
nRBC: 0 % (ref 0.0–0.2)

## 2022-10-07 LAB — CMP (CANCER CENTER ONLY)
ALT: 25 U/L (ref 0–44)
AST: 24 U/L (ref 15–41)
Albumin: 4 g/dL (ref 3.5–5.0)
Alkaline Phosphatase: 72 U/L (ref 38–126)
Anion gap: 8 (ref 5–15)
BUN: 33 mg/dL — ABNORMAL HIGH (ref 6–20)
CO2: 28 mmol/L (ref 22–32)
Calcium: 9.5 mg/dL (ref 8.9–10.3)
Chloride: 102 mmol/L (ref 98–111)
Creatinine: 1.96 mg/dL — ABNORMAL HIGH (ref 0.61–1.24)
GFR, Estimated: 40 mL/min — ABNORMAL LOW (ref >60)
Glucose, Bld: 114 mg/dL — ABNORMAL HIGH (ref 70–99)
Potassium: 4.1 mmol/L (ref 3.5–5.1)
Sodium: 138 mmol/L (ref 135–145)
Total Bilirubin: 0.8 mg/dL (ref 0.3–1.2)
Total Protein: 7.3 g/dL (ref 6.5–8.1)

## 2022-10-07 LAB — FERRITIN: Ferritin: 14 ng/mL — ABNORMAL LOW (ref 24–336)

## 2022-10-07 NOTE — Progress Notes (Signed)
Hematology and Oncology Follow Up Visit  Jonathan Oliver [id number] [birth year] 57 y.o. 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 He is doing well but has had some sinus congestion/drainage and itchy watery eyes with the pollen.  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 He will try taking Claritin daily.  No fever, chills, n/v, cough, rash, dizziness, SOB, chest pain, palpitations, abdominal pain or changes in bowel or bladder habits.  No bruising or petechiae.  No swelling, tenderness, numbness or tingling in his extremities.  No falls or syncope reported.  Appetite and hydration are good. Weight is stable at 256 lbs.   ECOG Performance Status: 1 - Symptomatic but completely ambulatory  Medications:  Allergies as of 10/07/2022   No Known Allergies      Medication List        Accurate as of October 07, 2022  2:19 PM. If you have any questions, ask your nurse or doctor.          Accu-Chek Guide test strip Generic drug: glucose blood TEST 1-4 TIMES DAILY   Accu-Chek Softclix Lancets lancets TEST 1-4 TIMES DAILY   Adult Blood Pressure Cuff Lg Kit Use as needed   allopurinol 300 MG tablet Commonly known as: ZYLOPRIM Take 1 tablet (300 mg total) by mouth daily.   AMBULATORY NON FORMULARY MEDICATION Single glucometer with lancets, test strips Test daily.  Disp qs x 3 months. E11.49   AMBULATORY NON FORMULARY MEDICATION Single glucometer with lancets, test strips, patient to test 1-4 times daily.   amLODipine 5 MG tablet Commonly known as: NORVASC Take 1 tablet (5 mg total) by mouth daily.   cyanocobalamin 1000 MCG tablet Commonly known as: VITAMIN B12 Take 1 tablet (1,000 mcg total) by mouth  daily.   fluticasone 50 MCG/ACT nasal spray Commonly known as: FLONASE Place 2 sprays into both nostrils daily.   metoprolol tartrate 100 MG tablet Commonly known as: LOPRESSOR TAKE 1 TABLET BY MOUTH TWICE A DAY   Ozempic (0.25 or 0.5 MG/DOSE) 2 MG/1.5ML Sopn Generic drug: Semaglutide(0.25 or 0.5MG /DOS) Inject 0.25 mg into the skin once a week.   promethazine-dextromethorphan 6.25-15 MG/5ML syrup Commonly known as: PROMETHAZINE-DM Take 5 mLs by mouth 4 (four) times daily as needed for cough.   tadalafil 5 MG tablet Commonly known as: CIALIS Take 1 tablet (5 mg total) by mouth daily as needed for erectile dysfunction.   testosterone cypionate 200 MG/ML injection Commonly known as: DEPOTESTOSTERONE CYPIONATE Inject 1 mL (200 mg total) into the muscle every 14 (fourteen) days.   valsartan-hydrochlorothiazide 320-25 MG tablet Commonly known as: DIOVAN-HCT Take 1 tablet by mouth daily.        Allergies: No Known Allergies  Past Medical History, Surgical history, Social history, and Family History were reviewed and updated.  Review of Systems: All other 10 point review of systems is negative.   Physical Exam:  weight is 256 lb 12.8 oz (116.5 kg). His oral temperature is 98.6 F (37 C). His blood pressure is 107/75 and his pulse is 69. His respiration is 18 and oxygen saturation is 96%.   Wt Readings from Last 3 Encounters:  10/07/22 256 lb 12.8 oz (116.5 kg)  08/12/22 265 lb (120.2 kg)  08/09/22 250 lb (113.4 kg)    Ocular: Sclerae unicteric, pupils equal, round and reactive to light Ear-nose-throat: Oropharynx clear, dentition fair Lymphatic: No cervical or supraclavicular adenopathy Lungs no rales or rhonchi, good excursion bilaterally Heart regular rate and rhythm, no murmur appreciated Abd soft, nontender, positive bowel sounds MSK no focal spinal tenderness, no joint edema Neuro: non-focal, well-oriented, appropriate affect Breasts: Deferred   Lab Results   Component Value Date   WBC 9.4 10/07/2022   HGB 17.7 (H) 10/07/2022   HCT 52.9 (H) 10/07/2022   MCV 90.3 10/07/2022   PLT 249 10/07/2022   Lab Results  Component Value Date   FERRITIN 19 (L) 09/09/2022   IRON 75 09/09/2022   TIBC 430 09/09/2022   UIBC 355 09/09/2022   IRONPCTSAT 17 (L) 09/09/2022   Lab Results  Component Value Date   RETICCTPCT 2.0 02/20/2021   RBC 5.86 (H) 10/07/2022   No results found for: "KPAFRELGTCHN", "LAMBDASER", "KAPLAMBRATIO" No results found for: "IGGSERUM", "IGA", "IGMSERUM" No results found for: "TOTALPROTELP", "ALBUMINELP", "A1GS", "A2GS", "BETS", "BETA2SER", "GAMS", "MSPIKE", "SPEI"   Chemistry      Component Value Date/Time   NA 139 09/09/2022 1356   NA 142 06/18/2017 1425   K 4.4 09/09/2022 1356   K 4.1 06/18/2017 1425   CL 105 09/09/2022 1356   CL 103 06/18/2017 1425   CO2 26 09/09/2022 1356   CO2 26 06/18/2017 1425   BUN 23 (H) 09/09/2022 1356   BUN 24 (H) 06/18/2017 1425   CREATININE 1.47 (H) 09/09/2022 1356   CREATININE 1.43 (H) 08/02/2022 0833      Component Value Date/Time   CALCIUM 9.9 09/09/2022 1356   CALCIUM 10.0 06/18/2017 1425   ALKPHOS 80 09/09/2022 1356   ALKPHOS 95 (H) 06/18/2017 1425   AST 29 09/09/2022 1356   ALT 38 09/09/2022 1356   ALT 63 (H) 06/18/2017 1425   BILITOT 0.7 09/09/2022 1356       Impression and Plan: Jonathan Oliver is a very pleasant 57 yo caucasian gentleman with hemochromatosis, heterozygous for the H63D mutation. Iron studies are pending. We will set him up for phlebotomy if needed.  Lab check every 6 weeks and follow-up in 3 months.   Eileen Stanford, NP 4/15/20242:19 PM

## 2022-10-08 LAB — IRON AND IRON BINDING CAPACITY (CC-WL,HP ONLY)
Iron: 79 ug/dL (ref 45–182)
Saturation Ratios: 15 % — ABNORMAL LOW (ref 17.9–39.5)
TIBC: 518 ug/dL — ABNORMAL HIGH (ref 250–450)
UIBC: 439 ug/dL — ABNORMAL HIGH (ref 117–376)

## 2023-01-03 ENCOUNTER — Ambulatory Visit: Payer: BC Managed Care – PPO | Admitting: Sports Medicine

## 2023-01-03 ENCOUNTER — Encounter: Payer: Self-pay | Admitting: Sports Medicine

## 2023-01-03 VITALS — BP 124/87 | HR 54 | Ht 76.0 in | Wt 253.0 lb

## 2023-01-03 DIAGNOSIS — E1159 Type 2 diabetes mellitus with other circulatory complications: Secondary | ICD-10-CM | POA: Diagnosis not present

## 2023-01-03 DIAGNOSIS — I152 Hypertension secondary to endocrine disorders: Secondary | ICD-10-CM

## 2023-01-03 DIAGNOSIS — L918 Other hypertrophic disorders of the skin: Secondary | ICD-10-CM

## 2023-01-03 DIAGNOSIS — E1149 Type 2 diabetes mellitus with other diabetic neurological complication: Secondary | ICD-10-CM

## 2023-01-03 HISTORY — DX: Other hypertrophic disorders of the skin: L91.8

## 2023-01-03 LAB — POCT GLYCOSYLATED HEMOGLOBIN (HGB A1C): Hemoglobin A1C: 6.6 % — AB (ref 4.0–5.6)

## 2023-01-03 NOTE — Assessment & Plan Note (Signed)
Well-controlled, A1c 6.6%, back on Ozempic. His most recent hemoglobin was in the 17th, he does have polycythemia so if his hemoglobin is above 18 we need to avoid point-of-care hemoglobin A1c checks. Due for eye exam, ordering this now.

## 2023-01-03 NOTE — Assessment & Plan Note (Signed)
Well-controlled on amlodipine, valsartan/HCTZ, metoprolol, no changes.

## 2023-01-03 NOTE — Progress Notes (Signed)
    Procedures performed today:    Procedure:  Cryodestruction of mid chest skin tag Consent obtained and verified. Time-out conducted. Noted no overlying erythema, induration, or other signs of local infection. Completed without difficulty using Cryo-Gun. Advised to call if fevers/chills, erythema, induration, drainage, or persistent bleeding.  Independent interpretation of notes and tests performed by another provider:   None.  Brief History, Exam, Impression, and Recommendations:    Hypertension associated with diabetes (HCC) Well-controlled on amlodipine, valsartan/HCTZ, metoprolol, no changes.  Type 2 diabetes mellitus with neurological complications (HCC) Well-controlled, A1c 6.6%, back on Ozempic. His most recent hemoglobin was in the 17th, he does have polycythemia so if his hemoglobin is above 18 we need to avoid point-of-care hemoglobin A1c checks. Due for eye exam, ordering this now.   Skin tag Irritated skin tag on chest, aggressive cryotherapy as above, return as needed for this.    ____________________________________________ Ihor Austin. Benjamin Stain, M.D., ABFM., CAQSM., AME. Primary Care and Sports Medicine [facility]  Adjunct Professor of Family Medicine  [employer]  Restaurant manager, fast food

## 2023-01-03 NOTE — Assessment & Plan Note (Signed)
Irritated skin tag on chest, aggressive cryotherapy as above, return as needed for this.

## 2023-01-29 ENCOUNTER — Other Ambulatory Visit: Payer: Self-pay | Admitting: Sports Medicine

## 2023-01-29 DIAGNOSIS — I10 Essential (primary) hypertension: Secondary | ICD-10-CM

## 2023-01-29 DIAGNOSIS — I152 Hypertension secondary to endocrine disorders: Secondary | ICD-10-CM

## 2023-02-12 LAB — HM DIABETES EYE EXAM

## 2023-02-21 ENCOUNTER — Other Ambulatory Visit: Payer: Self-pay | Admitting: Sports Medicine

## 2023-02-21 DIAGNOSIS — I152 Hypertension secondary to endocrine disorders: Secondary | ICD-10-CM

## 2023-02-21 DIAGNOSIS — I10 Essential (primary) hypertension: Secondary | ICD-10-CM

## 2023-02-23 ENCOUNTER — Other Ambulatory Visit: Payer: Self-pay | Admitting: Sports Medicine

## 2023-02-23 DIAGNOSIS — I10 Essential (primary) hypertension: Secondary | ICD-10-CM

## 2023-02-23 DIAGNOSIS — E1159 Type 2 diabetes mellitus with other circulatory complications: Secondary | ICD-10-CM

## 2023-03-13 ENCOUNTER — Encounter: Payer: Self-pay | Admitting: Sports Medicine

## 2023-03-23 ENCOUNTER — Other Ambulatory Visit: Payer: Self-pay | Admitting: Sports Medicine

## 2023-03-23 DIAGNOSIS — I152 Hypertension secondary to endocrine disorders: Secondary | ICD-10-CM

## 2023-03-23 DIAGNOSIS — I10 Essential (primary) hypertension: Secondary | ICD-10-CM

## 2023-05-29 ENCOUNTER — Other Ambulatory Visit: Payer: Self-pay | Admitting: Sports Medicine

## 2023-05-29 DIAGNOSIS — E1159 Type 2 diabetes mellitus with other circulatory complications: Secondary | ICD-10-CM

## 2023-05-29 DIAGNOSIS — I10 Essential (primary) hypertension: Secondary | ICD-10-CM

## 2023-06-23 ENCOUNTER — Encounter: Payer: Self-pay | Admitting: Hematology & Oncology

## 2023-07-01 ENCOUNTER — Other Ambulatory Visit: Payer: Self-pay | Admitting: Sports Medicine

## 2023-07-01 DIAGNOSIS — N5201 Erectile dysfunction due to arterial insufficiency: Secondary | ICD-10-CM

## 2023-07-02 ENCOUNTER — Other Ambulatory Visit: Payer: Self-pay | Admitting: Sports Medicine

## 2023-07-02 DIAGNOSIS — M1000 Idiopathic gout, unspecified site: Secondary | ICD-10-CM

## 2023-07-03 ENCOUNTER — Other Ambulatory Visit: Payer: Self-pay | Admitting: Sports Medicine

## 2023-07-03 DIAGNOSIS — E1159 Type 2 diabetes mellitus with other circulatory complications: Secondary | ICD-10-CM

## 2023-07-03 DIAGNOSIS — I10 Essential (primary) hypertension: Secondary | ICD-10-CM

## 2023-07-07 ENCOUNTER — Ambulatory Visit (INDEPENDENT_AMBULATORY_CARE_PROVIDER_SITE_OTHER): Payer: 59 | Admitting: Sports Medicine

## 2023-07-07 ENCOUNTER — Encounter: Payer: Self-pay | Admitting: Sports Medicine

## 2023-07-07 ENCOUNTER — Encounter: Payer: Self-pay | Admitting: Hematology & Oncology

## 2023-07-07 VITALS — BP 134/83 | HR 83 | Ht 76.0 in | Wt 261.0 lb

## 2023-07-07 DIAGNOSIS — E1149 Type 2 diabetes mellitus with other diabetic neurological complication: Secondary | ICD-10-CM

## 2023-07-07 DIAGNOSIS — Z7985 Long-term (current) use of injectable non-insulin antidiabetic drugs: Secondary | ICD-10-CM

## 2023-07-07 DIAGNOSIS — E1121 Type 2 diabetes mellitus with diabetic nephropathy: Secondary | ICD-10-CM | POA: Diagnosis not present

## 2023-07-07 DIAGNOSIS — Z0000 Encounter for general adult medical examination without abnormal findings: Secondary | ICD-10-CM

## 2023-07-07 LAB — POCT GLYCOSYLATED HEMOGLOBIN (HGB A1C): Hemoglobin A1C: 7.2 % — AB (ref 4.0–5.6)

## 2023-07-07 NOTE — Progress Notes (Signed)
    Procedures performed today:    None.  Independent interpretation of notes and tests performed by another provider:   None.  Brief History, Exam, Impression, and Recommendations:    Diabetic nephropathy associated with type 2 diabetes mellitus (HCC) This pleasant 58 year old male returns, he has controlled diabetes. He has had some dietary indiscretions over the holidays, A1c is up to 7.2%. Otherwise he is up-to-date on diabetic screening measures. He plans to cut back on the carbohydrates and we can recheck his A1c in 6 months.    ____________________________________________ Debby PARAS. Curtis, M.D., ABFM., CAQSM., AME. Primary Care and Sports Medicine [facility]  Adjunct Professor of [facility] Medicine  [employer]  Restaurant Manager, [employer]

## 2023-07-07 NOTE — Assessment & Plan Note (Signed)
 This pleasant 58 year old male returns, he has controlled diabetes. He has had some dietary indiscretions over the holidays, A1c is up to 7.2%. Otherwise he is up-to-date on diabetic screening measures. He plans to cut back on the carbohydrates and we can recheck his A1c in 6 months.

## 2023-07-08 ENCOUNTER — Other Ambulatory Visit: Payer: Self-pay | Admitting: Sports Medicine

## 2023-07-08 DIAGNOSIS — I10 Essential (primary) hypertension: Secondary | ICD-10-CM

## 2023-07-08 DIAGNOSIS — I152 Hypertension secondary to endocrine disorders: Secondary | ICD-10-CM

## 2023-07-31 ENCOUNTER — Other Ambulatory Visit: Payer: Self-pay | Admitting: Sports Medicine

## 2023-08-01 ENCOUNTER — Encounter: Payer: Self-pay | Admitting: Sports Medicine

## 2023-08-01 DIAGNOSIS — I10 Essential (primary) hypertension: Secondary | ICD-10-CM

## 2023-08-05 MED ORDER — VALSARTAN-HYDROCHLOROTHIAZIDE 320-25 MG PO TABS: 1.0000 | ORAL_TABLET | Freq: Every day | ORAL | 3 refills | Status: DC

## 2023-08-05 NOTE — Addendum Note (Signed)
Addended byRoselyn Reef on: 08/05/2023 11:43 AM   Modules accepted: Orders

## 2023-08-23 ENCOUNTER — Telehealth: Payer: Self-pay

## 2023-08-23 NOTE — Telephone Encounter (Signed)
 Prior auth for: TADALAFIL  Determination: Pending as of 08/23/23 Auth #: [id number] Valid from: n/a

## 2023-08-30 ENCOUNTER — Other Ambulatory Visit: Payer: Self-pay | Admitting: Sports Medicine

## 2023-08-30 DIAGNOSIS — E1159 Type 2 diabetes mellitus with other circulatory complications: Secondary | ICD-10-CM

## 2023-08-30 DIAGNOSIS — I10 Essential (primary) hypertension: Secondary | ICD-10-CM

## 2023-08-31 ENCOUNTER — Other Ambulatory Visit: Payer: Self-pay | Admitting: Sports Medicine

## 2023-08-31 DIAGNOSIS — E1121 Type 2 diabetes mellitus with diabetic nephropathy: Secondary | ICD-10-CM

## 2023-09-19 NOTE — Telephone Encounter (Signed)
 Prior auth for: TADALAFIL  Determination: Denied Auth #: [id number] Reason: Medication must be use daily for symptomatic BPH in a patient who is 18 yrs or older. Patient does not meet the necessary criteria.

## 2023-09-22 ENCOUNTER — Ambulatory Visit (INDEPENDENT_AMBULATORY_CARE_PROVIDER_SITE_OTHER): Admitting: Sports Medicine

## 2023-09-22 ENCOUNTER — Encounter: Payer: Self-pay | Admitting: Sports Medicine

## 2023-09-22 VITALS — BP 133/89 | HR 89 | Resp 20 | Ht 76.0 in | Wt 258.0 lb

## 2023-09-22 DIAGNOSIS — R519 Headache, unspecified: Secondary | ICD-10-CM

## 2023-09-22 DIAGNOSIS — D751 Secondary polycythemia: Secondary | ICD-10-CM

## 2023-09-22 DIAGNOSIS — G8929 Other chronic pain: Secondary | ICD-10-CM

## 2023-09-22 MED ORDER — CYCLOBENZAPRINE HCL 10 MG PO TABS: ORAL_TABLET | ORAL | 0 refills | Status: DC

## 2023-09-22 MED ORDER — ACETAMINOPHEN ER 650 MG PO TBCR: 650.0000 mg | EXTENDED_RELEASE_TABLET | Freq: Three times a day (TID) | ORAL | Status: DC | PRN

## 2023-09-22 NOTE — Assessment & Plan Note (Signed)
 This is a very pleasant 58 year old male returns, we last discussed headaches in 2017, he had a potential mixed type headache, migrainous versus tension. He did well with Topamax and low-dose amitriptyline. More recently he has been headache free until about 2 months ago, now having increasing headaches that are bifrontal with radiation around the sides of the head into the back. No photophobia, no phonophobia, no nausea, no aura. No eye watering. No precipitating or palliating factors, no stressors. His neurologic exam is normal today, cranial nerves II to XII are intact, motor, sensory, coordination functions are all intact, no dysmetria, no dysdiadochokinesis. We will start conservatively, acetaminophen, muscle relaxer as I do think this is a tension type headache. I do want a baseline brain MRI with and without contrast. We will also get blood work as he does have polycythemia and does need regular phlebotomies, he has not had one in a while.

## 2023-09-22 NOTE — Progress Notes (Signed)
    Procedures performed today:    None.  Independent interpretation of notes and tests performed by another provider:   None.  Brief History, Exam, Impression, and Recommendations:    Chronic headache This is a very pleasant 58 year old male returns, we last discussed headaches in 2017, he had a potential mixed type headache, migrainous versus tension. He did well with Topamax and low-dose amitriptyline. More recently he has been headache free until about 2 months ago, now having increasing headaches that are bifrontal with radiation around the sides of the head into the back. No photophobia, no phonophobia, no nausea, no aura. No eye watering. No precipitating or palliating factors, no stressors. His neurologic exam is normal today, cranial nerves II to XII are intact, motor, sensory, coordination functions are all intact, no dysmetria, no dysdiadochokinesis. We will start conservatively, acetaminophen, muscle relaxer as I do think this is a tension type headache. I do want a baseline brain MRI with and without contrast. We will also get blood work as he does have polycythemia and does need regular phlebotomies, he has not had one in a while.  Erythrocytosis Hemoglobin and hematocrit are significantly elevated, I will go ahead and place the referral to hematology for discussion of phlebotomies, this could potentially be a cause of the recent headaches.    ____________________________________________ Ihor Austin. Benjamin Stain, M.D., ABFM., CAQSM., AME. Primary Care and Sports Medicine [facility]  Adjunct Professor of Family Medicine  [employer] [facility] [employer]  Restaurant manager, fast food

## 2023-09-24 LAB — COMPREHENSIVE METABOLIC PANEL WITH GFR
ALT: 31 IU/L (ref 0–44)
AST: 30 IU/L (ref 0–40)
Albumin: 4.1 g/dL (ref 3.8–4.9)
Alkaline Phosphatase: 127 IU/L — ABNORMAL HIGH (ref 44–121)
BUN/Creatinine Ratio: 13 (ref 9–20)
BUN: 19 mg/dL (ref 6–24)
Bilirubin Total: 0.6 mg/dL (ref 0.0–1.2)
CO2: 19 mmol/L — ABNORMAL LOW (ref 20–29)
Calcium: 9.5 mg/dL (ref 8.7–10.2)
Chloride: 101 mmol/L (ref 96–106)
Creatinine, Ser: 1.51 mg/dL — ABNORMAL HIGH (ref 0.76–1.27)
Globulin, Total: 2.4 g/dL (ref 1.5–4.5)
Glucose: 82 mg/dL (ref 70–99)
Potassium: 4.5 mmol/L (ref 3.5–5.2)
Sodium: 140 mmol/L (ref 134–144)
Total Protein: 6.5 g/dL (ref 6.0–8.5)
eGFR: 54 mL/min/{1.73_m2} — ABNORMAL LOW (ref >59)

## 2023-09-24 LAB — CBC
Hematocrit: 58.2 % — ABNORMAL HIGH (ref 37.5–51.0)
Hemoglobin: 19.5 g/dL — ABNORMAL HIGH (ref 13.0–17.7)
MCH: 32.6 pg (ref 26.6–33.0)
MCHC: 33.5 g/dL (ref 31.5–35.7)
MCV: 97 fL (ref 79–97)
Platelets: 169 10*3/uL (ref 150–450)
RBC: 5.99 x10E6/uL — ABNORMAL HIGH (ref 4.14–5.80)
RDW: 13.2 % (ref 11.6–15.4)
WBC: 7.5 10*3/uL (ref 3.4–10.8)

## 2023-09-24 NOTE — Addendum Note (Signed)
 Addended by: Monica Becton on: 09/24/2023 01:42 PM   Modules accepted: Orders

## 2023-09-24 NOTE — Assessment & Plan Note (Signed)
 Hemoglobin and hematocrit are significantly elevated, I will go ahead and place the referral to hematology for discussion of phlebotomies, this could potentially be a cause of the recent headaches.

## 2023-09-30 ENCOUNTER — Inpatient Hospital Stay

## 2023-09-30 ENCOUNTER — Ambulatory Visit

## 2023-09-30 ENCOUNTER — Inpatient Hospital Stay (HOSPITAL_BASED_OUTPATIENT_CLINIC_OR_DEPARTMENT_OTHER): Admitting: Family

## 2023-09-30 ENCOUNTER — Inpatient Hospital Stay: Attending: Hematology & Oncology

## 2023-09-30 ENCOUNTER — Encounter: Payer: Self-pay | Admitting: Family

## 2023-09-30 VITALS — BP 138/87 | HR 62 | Resp 17

## 2023-09-30 DIAGNOSIS — R519 Headache, unspecified: Secondary | ICD-10-CM

## 2023-09-30 DIAGNOSIS — D751 Secondary polycythemia: Secondary | ICD-10-CM | POA: Diagnosis not present

## 2023-09-30 DIAGNOSIS — G8929 Other chronic pain: Secondary | ICD-10-CM

## 2023-09-30 LAB — CBC WITH DIFFERENTIAL (CANCER CENTER ONLY)
Abs Immature Granulocytes: 0.05 10*3/uL (ref 0.00–0.07)
Basophils Absolute: 0.1 10*3/uL (ref 0.0–0.1)
Basophils Relative: 2 %
Eosinophils Absolute: 0.2 10*3/uL (ref 0.0–0.5)
Eosinophils Relative: 3 %
HCT: 58.5 % — ABNORMAL HIGH (ref 39.0–52.0)
Hemoglobin: 20.3 g/dL — ABNORMAL HIGH (ref 13.0–17.0)
Immature Granulocytes: 1 %
Lymphocytes Relative: 17 %
Lymphs Abs: 1.4 10*3/uL (ref 0.7–4.0)
MCH: 32.8 pg (ref 26.0–34.0)
MCHC: 34.7 g/dL (ref 30.0–36.0)
MCV: 94.7 fL (ref 80.0–100.0)
Monocytes Absolute: 1.1 10*3/uL — ABNORMAL HIGH (ref 0.1–1.0)
Monocytes Relative: 14 %
Neutro Abs: 5.3 10*3/uL (ref 1.7–7.7)
Neutrophils Relative %: 63 %
Platelet Count: 157 10*3/uL (ref 150–400)
RBC: 6.18 MIL/uL — ABNORMAL HIGH (ref 4.22–5.81)
RDW: 13.2 % (ref 11.5–15.5)
WBC Count: 8.2 10*3/uL (ref 4.0–10.5)
nRBC: 0 % (ref 0.0–0.2)

## 2023-09-30 LAB — CMP (CANCER CENTER ONLY)
ALT: 29 U/L (ref 0–44)
AST: 26 U/L (ref 15–41)
Albumin: 4 g/dL (ref 3.5–5.0)
Alkaline Phosphatase: 82 U/L (ref 38–126)
Anion gap: 8 (ref 5–15)
BUN: 18 mg/dL (ref 6–20)
CO2: 29 mmol/L (ref 22–32)
Calcium: 9.6 mg/dL (ref 8.9–10.3)
Chloride: 103 mmol/L (ref 98–111)
Creatinine: 1.52 mg/dL — ABNORMAL HIGH (ref 0.61–1.24)
GFR, Estimated: 53 mL/min — ABNORMAL LOW (ref >60)
Glucose, Bld: 102 mg/dL — ABNORMAL HIGH (ref 70–99)
Potassium: 4.1 mmol/L (ref 3.5–5.1)
Sodium: 140 mmol/L (ref 135–145)
Total Bilirubin: 1 mg/dL (ref 0.0–1.2)
Total Protein: 6.9 g/dL (ref 6.5–8.1)

## 2023-09-30 LAB — FERRITIN: Ferritin: 91 ng/mL (ref 24–336)

## 2023-09-30 MED ORDER — GADOBUTROL 1 MMOL/ML IV SOLN
10.0000 mL | Freq: Once | INTRAVENOUS | Status: AC | PRN
Start: 2023-09-30 — End: 2023-09-30
  Administered 2023-09-30: 10 mL via INTRAVENOUS

## 2023-09-30 NOTE — Progress Notes (Unsigned)
 Hematology and Oncology Follow Up Visit  Jonathan Oliver [id number] May 21, 1966 58 y.o. 09/30/2023   Principle Diagnosis:  Hemochromatosis, heterozygous for the H63D mutation JAK 2 Negative   Current Therapy:        Phlebotomy to maintain iron saturation less than 50% and ferritin less than 100 Aspirin 81 mg PO daily   Interim History:  Jonathan Oliver is here today for follow-up. We last saw him almost a year ago. He had labs with his PCP a week ago and this showed a Hgb of 19.5. Today his Hgb is 20.3.  Overall complexion is ruddy.  He notes a persistent headache as well as occasional SOB with over exertion that resolves with rest.  He is taking his baby aspirin daily.  He states that he stopped using the testosterone months ago.  He denies hot flashes or night sweats.  No itching.  No vision changes or loss.  No fever, chills, n/v, cough, rash, dizziness, chest pain, palpitations, abdominal pain or changes in bowel or bladder habits.  No swelling in his extremities.  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  No falls or syncope reported.  Appetite and hydration are good. Weight is stable at 256 lbs.   ECOG Performance Status: 1 - Symptomatic but completely ambulatory  Medications:  Allergies as of 09/30/2023   No Known Allergies      Medication List        Accurate as of September 30, 2023  2:36 PM. If you have any questions, ask your nurse or doctor.          Accu-Chek Guide test strip Generic drug: glucose blood TEST 1-4 TIMES DAILY   Accu-Chek Softclix Lancets lancets TEST 1-4 TIMES DAILY   acetaminophen 650 MG CR tablet Commonly known as: TYLENOL Take 1 tablet (650 mg total) by mouth every 8 (eight) hours as needed for pain.   Adult Blood Pressure Cuff Lg Kit Use as needed   allopurinol 300 MG tablet Commonly known as: ZYLOPRIM TAKE 1 TABLET BY MOUTH EVERY DAY   AMBULATORY NON FORMULARY MEDICATION Single glucometer with lancets, test strips Test  daily.  Disp qs x 3 months. E11.49   AMBULATORY NON FORMULARY MEDICATION Single glucometer with lancets, test strips, patient to test 1-4 times daily.   amLODipine 5 MG tablet Commonly known as: NORVASC Take 1 tablet (5 mg total) by mouth daily.   cyanocobalamin 1000 MCG tablet Commonly known as: VITAMIN B12 Take 1 tablet (1,000 mcg total) by mouth daily.   cyclobenzaprine 10 MG tablet Commonly known as: FLEXERIL One half to one tab PO qHS, then increase gradually to one tab TID.   fluticasone 50 MCG/ACT nasal spray Commonly known as: FLONASE Place 2 sprays into both nostrils daily.   metoprolol tartrate 100 MG tablet Commonly known as: LOPRESSOR TAKE 1 TABLET BY MOUTH TWICE A DAY   Ozempic (0.25 or 0.5 MG/DOSE) 2 MG/3ML Sopn Generic drug: Semaglutide(0.25 or 0.5MG /DOS) INJECT 0.25MG  INTO THE SKIN ONE TIME PER WEEK   promethazine-dextromethorphan 6.25-15 MG/5ML syrup Commonly known as: PROMETHAZINE-DM Take 5 mLs by mouth 4 (four) times daily as needed for cough.   tadalafil 5 MG tablet Commonly known as: CIALIS TAKE 1 TABLET BY MOUTH DAILY AS NEEDED FOR ERECTILE DYSFUNCTION   testosterone cypionate 200 MG/ML injection Commonly known as: DEPOTESTOSTERONE CYPIONATE Inject 1 mL (200 mg total) into the muscle every 14 (fourteen) days.   valsartan-hydrochlorothiazide 320-25 MG tablet Commonly known as: DIOVAN-HCT Take 1 tablet by  mouth daily.        Allergies: No Known Allergies  Past Medical History, Surgical history, Social history, and Family History were reviewed and updated.  Review of Systems: All other 10 point review of systems is negative.   Physical Exam:  vitals were not taken for this visit.   Wt Readings from Last 3 Encounters:  09/22/23 258 lb 0.6 oz (117 kg)  07/07/23 261 lb (118.4 kg)  01/03/23 253 lb (114.8 kg)    Ocular: Sclerae unicteric, pupils equal, round and reactive to light Ear-nose-throat: Oropharynx clear, dentition  fair Lymphatic: No cervical or supraclavicular adenopathy Lungs no rales or rhonchi, good excursion bilaterally Heart regular rate and rhythm, no murmur appreciated Abd soft, nontender, positive bowel sounds MSK no focal spinal tenderness, no joint edema Neuro: non-focal, well-oriented, appropriate affect Breasts: Deferred   Lab Results  Component Value Date   WBC 7.5 09/23/2023   HGB 19.5 (H) 09/23/2023   HCT 58.2 (H) 09/23/2023   MCV 97 09/23/2023   PLT 169 09/23/2023   Lab Results  Component Value Date   FERRITIN 14 (L) 10/07/2022   IRON 79 10/07/2022   TIBC 518 (H) 10/07/2022   UIBC 439 (H) 10/07/2022   IRONPCTSAT 15 (L) 10/07/2022   Lab Results  Component Value Date   RETICCTPCT 2.0 02/20/2021   RBC 5.99 (H) 09/23/2023   No results found for: "KPAFRELGTCHN", "LAMBDASER", "KAPLAMBRATIO" No results found for: "IGGSERUM", "IGA", "IGMSERUM" No results found for: "TOTALPROTELP", "ALBUMINELP", "A1GS", "A2GS", "BETS", "BETA2SER", "GAMS", "MSPIKE", "SPEI"   Chemistry      Component Value Date/Time   NA 140 09/23/2023 0814   NA 142 06/18/2017 1425   K 4.5 09/23/2023 0814   K 4.1 06/18/2017 1425   CL 101 09/23/2023 0814   CL 103 06/18/2017 1425   CO2 19 (L) 09/23/2023 0814   CO2 26 06/18/2017 1425   BUN 19 09/23/2023 0814   BUN 24 (H) 06/18/2017 1425   CREATININE 1.51 (H) 09/23/2023 0814   CREATININE 1.96 (H) 10/07/2022 1351   CREATININE 1.43 (H) 08/02/2022 0833      Component Value Date/Time   CALCIUM 9.5 09/23/2023 0814   CALCIUM 10.0 06/18/2017 1425   ALKPHOS 127 (H) 09/23/2023 0814   ALKPHOS 95 (H) 06/18/2017 1425   AST 30 09/23/2023 0814   AST 24 10/07/2022 1351   ALT 31 09/23/2023 0814   ALT 25 10/07/2022 1351   ALT 63 (H) 06/18/2017 1425   BILITOT 0.6 09/23/2023 0814   BILITOT 0.8 10/07/2022 1351       Impression and Plan: Jonathan Oliver is a very pleasant 58 yo caucasian gentleman with hemochromatosis, heterozygous for the H63D mutation. We will  proceed with phlebotomy today for elevated Hgb.  We will do another phlebotomy next week and recheck lab in 3 weeks.  Follow-up in 6 weeks.   Eileen Stanford, NP 4/8/20252:36 PM

## 2023-09-30 NOTE — Patient Instructions (Signed)

## 2023-09-30 NOTE — Progress Notes (Signed)
 Jonathan Oliver. presents today for phlebotomy per MD orders. Phlebotomy procedure started at 1508 and ended at 1516. 550 grams removed via 14 gauge needle to left AC using phlebotomy kit. Pt declined to stay for post procedure observation period. Pt stated he has tolerated procedure multiple times prior without difficulty. Pt aware to call clinic with any questions or concerns. Pt verbalized understanding and had no further questions.  Patient tolerated procedure well. IV needle removed intact.

## 2023-10-01 ENCOUNTER — Encounter: Payer: Self-pay | Admitting: Hematology & Oncology

## 2023-10-01 LAB — IRON AND IRON BINDING CAPACITY (CC-WL,HP ONLY)
Iron: 164 ug/dL (ref 45–182)
Saturation Ratios: 43 % — ABNORMAL HIGH (ref 17.9–39.5)
TIBC: 382 ug/dL (ref 250–450)
UIBC: 218 ug/dL (ref 117–376)

## 2023-10-07 ENCOUNTER — Inpatient Hospital Stay

## 2023-10-07 VITALS — BP 136/81 | HR 74 | Temp 97.7°F | Resp 18

## 2023-10-07 DIAGNOSIS — D751 Secondary polycythemia: Secondary | ICD-10-CM

## 2023-10-07 NOTE — Progress Notes (Signed)
 Jonathan Oliver. presents today for phlebotomy per MD orders. Phlebotomy procedure started at 1420 and ended at 1425. 500 grams removed. Patient refused to be observed for 30 minutes after procedure. VSS.  Patient tolerated procedure well. IV needle removed intact.

## 2023-10-07 NOTE — Patient Instructions (Signed)

## 2023-10-21 ENCOUNTER — Inpatient Hospital Stay

## 2023-10-21 DIAGNOSIS — D751 Secondary polycythemia: Secondary | ICD-10-CM

## 2023-10-21 LAB — CBC WITH DIFFERENTIAL (CANCER CENTER ONLY)
Abs Immature Granulocytes: 0.15 10*3/uL — ABNORMAL HIGH (ref 0.00–0.07)
Basophils Absolute: 0.1 10*3/uL (ref 0.0–0.1)
Basophils Relative: 1 %
Eosinophils Absolute: 0.2 10*3/uL (ref 0.0–0.5)
Eosinophils Relative: 3 %
HCT: 52.1 % — ABNORMAL HIGH (ref 39.0–52.0)
Hemoglobin: 17.9 g/dL — ABNORMAL HIGH (ref 13.0–17.0)
Immature Granulocytes: 2 %
Lymphocytes Relative: 22 %
Lymphs Abs: 1.6 10*3/uL (ref 0.7–4.0)
MCH: 32.9 pg (ref 26.0–34.0)
MCHC: 34.4 g/dL (ref 30.0–36.0)
MCV: 95.8 fL (ref 80.0–100.0)
Monocytes Absolute: 0.8 10*3/uL (ref 0.1–1.0)
Monocytes Relative: 10 %
Neutro Abs: 4.7 10*3/uL (ref 1.7–7.7)
Neutrophils Relative %: 62 %
Platelet Count: 166 10*3/uL (ref 150–400)
RBC: 5.44 MIL/uL (ref 4.22–5.81)
RDW: 14.1 % (ref 11.5–15.5)
WBC Count: 7.5 10*3/uL (ref 4.0–10.5)
nRBC: 0 % (ref 0.0–0.2)

## 2023-10-21 LAB — CMP (CANCER CENTER ONLY)
ALT: 31 U/L (ref 0–44)
AST: 29 U/L (ref 15–41)
Albumin: 3 g/dL — ABNORMAL LOW (ref 3.5–5.0)
Alkaline Phosphatase: 58 U/L (ref 38–126)
Anion gap: 10 (ref 5–15)
BUN: 19 mg/dL (ref 6–20)
CO2: 26 mmol/L (ref 22–32)
Calcium: 8.5 mg/dL — ABNORMAL LOW (ref 8.9–10.3)
Chloride: 103 mmol/L (ref 98–111)
Creatinine: 1.49 mg/dL — ABNORMAL HIGH (ref 0.61–1.24)
GFR, Estimated: 55 mL/min — ABNORMAL LOW (ref >60)
Glucose, Bld: 115 mg/dL — ABNORMAL HIGH (ref 70–99)
Potassium: 3.9 mmol/L (ref 3.5–5.1)
Sodium: 139 mmol/L (ref 135–145)
Total Bilirubin: 1 mg/dL (ref 0.0–1.2)
Total Protein: 6.6 g/dL (ref 6.5–8.1)

## 2023-10-21 LAB — FERRITIN: Ferritin: 73 ng/mL (ref 24–336)

## 2023-10-21 NOTE — Patient Instructions (Signed)

## 2023-10-21 NOTE — Progress Notes (Signed)
 Jonathan Oliver. presents today for phlebotomy per MD orders. Phlebotomy procedure started at 1400  and ended at 1425. 500 grams removed. Patient declined to stay for 30 minutes post phlebotomy Patient tolerated procedure well. IV needle removed intact.

## 2023-10-22 ENCOUNTER — Encounter: Payer: Self-pay | Admitting: *Deleted

## 2023-10-22 LAB — IRON AND IRON BINDING CAPACITY (CC-WL,HP ONLY)
Iron: 141 ug/dL (ref 45–182)
Saturation Ratios: 34 % (ref 17.9–39.5)
TIBC: 417 ug/dL (ref 250–450)
UIBC: 276 ug/dL (ref 117–376)

## 2023-11-11 ENCOUNTER — Inpatient Hospital Stay

## 2023-11-11 ENCOUNTER — Other Ambulatory Visit: Payer: Self-pay

## 2023-11-11 ENCOUNTER — Encounter: Payer: Self-pay | Admitting: Family

## 2023-11-11 ENCOUNTER — Inpatient Hospital Stay: Attending: Hematology & Oncology

## 2023-11-11 ENCOUNTER — Inpatient Hospital Stay (HOSPITAL_BASED_OUTPATIENT_CLINIC_OR_DEPARTMENT_OTHER): Admitting: Family

## 2023-11-11 VITALS — BP 132/86 | HR 69 | Temp 98.1°F | Resp 17

## 2023-11-11 VITALS — BP 139/96 | HR 65 | Temp 97.9°F | Resp 17 | Ht 77.0 in | Wt 255.0 lb

## 2023-11-11 DIAGNOSIS — D751 Secondary polycythemia: Secondary | ICD-10-CM

## 2023-11-11 DIAGNOSIS — Z7982 Long term (current) use of aspirin: Secondary | ICD-10-CM | POA: Insufficient documentation

## 2023-11-11 LAB — CMP (CANCER CENTER ONLY)
ALT: 29 U/L (ref 0–44)
AST: 25 U/L (ref 15–41)
Albumin: 4.1 g/dL (ref 3.5–5.0)
Alkaline Phosphatase: 76 U/L (ref 38–126)
Anion gap: 9 (ref 5–15)
BUN: 22 mg/dL — ABNORMAL HIGH (ref 6–20)
CO2: 24 mmol/L (ref 22–32)
Calcium: 9.2 mg/dL (ref 8.9–10.3)
Chloride: 105 mmol/L (ref 98–111)
Creatinine: 1.46 mg/dL — ABNORMAL HIGH (ref 0.61–1.24)
GFR, Estimated: 56 mL/min — ABNORMAL LOW (ref >60)
Glucose, Bld: 154 mg/dL — ABNORMAL HIGH (ref 70–99)
Potassium: 4 mmol/L (ref 3.5–5.1)
Sodium: 138 mmol/L (ref 135–145)
Total Bilirubin: 1.1 mg/dL (ref 0.0–1.2)
Total Protein: 6.7 g/dL (ref 6.5–8.1)

## 2023-11-11 LAB — CBC WITH DIFFERENTIAL (CANCER CENTER ONLY)
Abs Immature Granulocytes: 0.06 10*3/uL (ref 0.00–0.07)
Basophils Absolute: 0.1 10*3/uL (ref 0.0–0.1)
Basophils Relative: 1 %
Eosinophils Absolute: 0.2 10*3/uL (ref 0.0–0.5)
Eosinophils Relative: 3 %
HCT: 50.6 % (ref 39.0–52.0)
Hemoglobin: 17.3 g/dL — ABNORMAL HIGH (ref 13.0–17.0)
Immature Granulocytes: 1 %
Lymphocytes Relative: 22 %
Lymphs Abs: 1.7 10*3/uL (ref 0.7–4.0)
MCH: 32.2 pg (ref 26.0–34.0)
MCHC: 34.2 g/dL (ref 30.0–36.0)
MCV: 94.2 fL (ref 80.0–100.0)
Monocytes Absolute: 1 10*3/uL (ref 0.1–1.0)
Monocytes Relative: 12 %
Neutro Abs: 5 10*3/uL (ref 1.7–7.7)
Neutrophils Relative %: 61 %
Platelet Count: 183 10*3/uL (ref 150–400)
RBC: 5.37 MIL/uL (ref 4.22–5.81)
RDW: 14 % (ref 11.5–15.5)
WBC Count: 8 10*3/uL (ref 4.0–10.5)
nRBC: 0 % (ref 0.0–0.2)

## 2023-11-11 LAB — FERRITIN: Ferritin: 44 ng/mL (ref 24–336)

## 2023-11-11 LAB — IRON AND IRON BINDING CAPACITY (CC-WL,HP ONLY)
Iron: 113 ug/dL (ref 45–182)
Saturation Ratios: 25 % (ref 17.9–39.5)
TIBC: 451 ug/dL — ABNORMAL HIGH (ref 250–450)
UIBC: 338 ug/dL (ref 117–376)

## 2023-11-11 NOTE — Patient Instructions (Signed)

## 2023-11-11 NOTE — Progress Notes (Signed)
 Jonathan Oliver. presents today for phlebotomy per MD orders. Phlebotomy procedure started at 1357 and ended at 1402. 525 cc removed via 16 G needle at R [facility] site. Patient tolerated procedure well. Pastient refused to wiat 30 minutes post phlebotomy, released stable and ASX.

## 2023-11-11 NOTE — Progress Notes (Signed)
 Hematology and Oncology Follow Up Visit  Jonathan Oliver [id number] June 21, 1966 58 y.o. 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 He is doing well but has some occasional headaches.  Hgb is 17.3, Hct 50.6%.  He denies fever, chills, n/v, cough, rash, dizziness, SOB, chest pain, palpitations, abdominal pain or changes in bowel or bladder habits at this time.  He has occasional SOB with exertion.  No swelling, tenderness, numbness or tingling in his extremities.  No falls or syncope reported.  Appetite and hydration are good. Weight is stable at 255 lbs.   ECOG Performance Status: 1 - Symptomatic but completely ambulatory  Medications:  Allergies as of 11/11/2023   No Known Allergies      Medication List        Accurate as of Nov 11, 2023  1:22 PM. If you have any questions, ask your nurse or doctor.          Accu-Chek Guide test strip Generic drug: glucose blood TEST 1-4 TIMES DAILY   Accu-Chek Softclix Lancets lancets TEST 1-4 TIMES DAILY   acetaminophen  650 MG CR tablet Commonly known as: TYLENOL  Take 1 tablet (650 mg total) by mouth every 8 (eight) hours as needed for pain.   Adult Blood Pressure Cuff Lg Kit Use as needed   allopurinol  300 MG tablet Commonly known as: ZYLOPRIM  TAKE 1 TABLET BY MOUTH EVERY DAY   AMBULATORY NON FORMULARY MEDICATION Single glucometer with lancets, test strips Test daily.  Disp qs x 3 months. E11.49   AMBULATORY NON FORMULARY MEDICATION Single glucometer with lancets, test strips, patient to test 1-4 times daily.   amLODipine  5 MG tablet Commonly known as: NORVASC  Take 1 tablet (5 mg total) by mouth daily.   cyanocobalamin  1000 MCG tablet Commonly known  as: VITAMIN B12 Take 1 tablet (1,000 mcg total) by mouth daily.   cyclobenzaprine  10 MG tablet Commonly known as: FLEXERIL  One half to one tab PO qHS, then increase gradually to one tab TID.   fluticasone  50 MCG/ACT nasal spray Commonly known as: FLONASE  Place 2 sprays into both nostrils daily.   metoprolol  tartrate 100 MG tablet Commonly known as: LOPRESSOR  TAKE 1 TABLET BY MOUTH TWICE A DAY   Ozempic  (0.25 or 0.5 MG/DOSE) 2 MG/3ML Sopn Generic drug: Semaglutide (0.25 or 0.5MG /DOS) INJECT 0.25MG  INTO THE SKIN ONE TIME PER WEEK   promethazine -dextromethorphan 6.25-15 MG/5ML syrup Commonly known as: PROMETHAZINE -DM Take 5 mLs by mouth 4 (four) times daily as needed for cough.   tadalafil  5 MG tablet Commonly known as: CIALIS  TAKE 1 TABLET BY MOUTH DAILY AS NEEDED FOR ERECTILE DYSFUNCTION   testosterone  cypionate 200 MG/ML injection Commonly known as: DEPOTESTOSTERONE CYPIONATE Inject 1 mL (200 mg total) into the muscle every 14 (fourteen) days.   valsartan -hydrochlorothiazide  320-25 MG tablet Commonly known as: DIOVAN -HCT Take 1 tablet by mouth daily.        Allergies: No Known Allergies  Past Medical History, Surgical history, Social history, and Family History were reviewed and updated.  Review of Systems: All other 10 point review of systems is negative.   Physical Exam:  vitals were not taken for this visit.   Wt Readings from Last 3 Encounters:  09/30/23 256  lb (116.1 kg)  09/22/23 258 lb 0.6 oz (117 kg)  07/07/23 261 lb (118.4 kg)    Ocular: Sclerae unicteric, pupils equal, round and reactive to light Ear-nose-throat: Oropharynx clear, dentition fair Lymphatic: No cervical or supraclavicular adenopathy Lungs no rales or rhonchi, good excursion bilaterally Heart regular rate and rhythm, no murmur appreciated Abd soft, nontender, positive bowel sounds MSK no focal spinal tenderness, no joint edema Neuro: non-focal, well-oriented, appropriate  affect Breasts: Deferred   Lab Results  Component Value Date   WBC 8.0 11/11/2023   HGB 17.3 (H) 11/11/2023   HCT 50.6 11/11/2023   MCV 94.2 11/11/2023   PLT 183 11/11/2023   Lab Results  Component Value Date   FERRITIN 73 10/21/2023   IRON 141 10/21/2023   TIBC 417 10/21/2023   UIBC 276 10/21/2023   IRONPCTSAT 34 10/21/2023   Lab Results  Component Value Date   RETICCTPCT 2.0 02/20/2021   RBC 5.37 11/11/2023   No results found for: "KPAFRELGTCHN", "LAMBDASER", "KAPLAMBRATIO" No results found for: "IGGSERUM", "IGA", "IGMSERUM" No results found for: "TOTALPROTELP", "ALBUMINELP", "A1GS", "A2GS", "BETS", "BETA2SER", "GAMS", "MSPIKE", "SPEI"   Chemistry      Component Value Date/Time   NA 139 10/21/2023 1337   NA 140 09/23/2023 0814   NA 142 06/18/2017 1425   K 3.9 10/21/2023 1337   K 4.1 06/18/2017 1425   CL 103 10/21/2023 1337   CL 103 06/18/2017 1425   CO2 26 10/21/2023 1337   CO2 26 06/18/2017 1425   BUN 19 10/21/2023 1337   BUN 19 09/23/2023 0814   BUN 24 (H) 06/18/2017 1425   CREATININE 1.49 (H) 10/21/2023 1337   CREATININE 1.43 (H) 08/02/2022 0833      Component Value Date/Time   CALCIUM 8.5 (L) 10/21/2023 1337   CALCIUM 10.0 06/18/2017 1425   ALKPHOS 58 10/21/2023 1337   ALKPHOS 95 (H) 06/18/2017 1425   AST 29 10/21/2023 1337   ALT 31 10/21/2023 1337   ALT 63 (H) 06/18/2017 1425   BILITOT 1.0 10/21/2023 1337       Impression and Plan: Jonathan Oliver is a very pleasant 59 yo caucasian gentleman with hemochromatosis, heterozygous for the H63D mutation. We will proceed with phlebotomy for elevated Hgb/Hct.  Iron studies pending. We will set him up for another phlebotomy if needed.  Follow-up in 6 weeks.   Kennard Pea, NP 5/20/20251:22 PM

## 2023-12-23 ENCOUNTER — Encounter: Payer: Self-pay | Admitting: Family

## 2023-12-23 ENCOUNTER — Inpatient Hospital Stay

## 2023-12-23 ENCOUNTER — Inpatient Hospital Stay (HOSPITAL_BASED_OUTPATIENT_CLINIC_OR_DEPARTMENT_OTHER): Admitting: Family

## 2023-12-23 ENCOUNTER — Inpatient Hospital Stay: Attending: Family

## 2023-12-23 VITALS — BP 108/73 | HR 73 | Temp 97.9°F | Resp 18 | Ht 77.0 in | Wt 252.0 lb

## 2023-12-23 VITALS — BP 117/79 | HR 51

## 2023-12-23 DIAGNOSIS — D751 Secondary polycythemia: Secondary | ICD-10-CM

## 2023-12-23 DIAGNOSIS — Z7982 Long term (current) use of aspirin: Secondary | ICD-10-CM | POA: Diagnosis not present

## 2023-12-23 LAB — CBC WITH DIFFERENTIAL (CANCER CENTER ONLY)
Abs Immature Granulocytes: 0.05 10*3/uL (ref 0.00–0.07)
Basophils Absolute: 0.1 10*3/uL (ref 0.0–0.1)
Basophils Relative: 1 %
Eosinophils Absolute: 0.3 10*3/uL (ref 0.0–0.5)
Eosinophils Relative: 3 %
HCT: 54.5 % — ABNORMAL HIGH (ref 39.0–52.0)
Hemoglobin: 18.2 g/dL — ABNORMAL HIGH (ref 13.0–17.0)
Immature Granulocytes: 1 %
Lymphocytes Relative: 24 %
Lymphs Abs: 1.9 10*3/uL (ref 0.7–4.0)
MCH: 31.1 pg (ref 26.0–34.0)
MCHC: 33.4 g/dL (ref 30.0–36.0)
MCV: 93.2 fL (ref 80.0–100.0)
Monocytes Absolute: 1.1 10*3/uL — ABNORMAL HIGH (ref 0.1–1.0)
Monocytes Relative: 14 %
Neutro Abs: 4.6 10*3/uL (ref 1.7–7.7)
Neutrophils Relative %: 57 %
Platelet Count: 192 10*3/uL (ref 150–400)
RBC: 5.85 MIL/uL — ABNORMAL HIGH (ref 4.22–5.81)
RDW: 13.2 % (ref 11.5–15.5)
WBC Count: 8.1 10*3/uL (ref 4.0–10.5)
nRBC: 0 % (ref 0.0–0.2)

## 2023-12-23 LAB — CMP (CANCER CENTER ONLY)
ALT: 34 U/L (ref 0–44)
AST: 30 U/L (ref 15–41)
Albumin: 4.2 g/dL (ref 3.5–5.0)
Alkaline Phosphatase: 99 U/L (ref 38–126)
Anion gap: 8 (ref 5–15)
BUN: 29 mg/dL — ABNORMAL HIGH (ref 6–20)
CO2: 25 mmol/L (ref 22–32)
Calcium: 9.8 mg/dL (ref 8.9–10.3)
Chloride: 107 mmol/L (ref 98–111)
Creatinine: 1.67 mg/dL — ABNORMAL HIGH (ref 0.61–1.24)
GFR, Estimated: 48 mL/min — ABNORMAL LOW (ref >60)
Glucose, Bld: 144 mg/dL — ABNORMAL HIGH (ref 70–99)
Potassium: 4.6 mmol/L (ref 3.5–5.1)
Sodium: 140 mmol/L (ref 135–145)
Total Bilirubin: 0.7 mg/dL (ref 0.0–1.2)
Total Protein: 7.6 g/dL (ref 6.5–8.1)

## 2023-12-23 LAB — FERRITIN: Ferritin: 27 ng/mL (ref 24–336)

## 2023-12-23 NOTE — Patient Instructions (Signed)

## 2023-12-23 NOTE — Progress Notes (Signed)
 Hematology and Oncology Follow Up Visit  Jonathan Oliver [id number] [birth year] 58 y.o. 12/23/2023   Principle Diagnosis:  Hemochromatosis, heterozygous for the H63D mutation JAK 2 Negative   Current Therapy:        Phlebotomy to maintain iron saturation less than 50% and ferritin less than 100 Aspirin 81 mg PO daily   Interim History:  Jonathan Oliver is here today for follow-up. Hgb is up to 18.2, Hct 54.4%.  He spends his work days outside in the sun so complexion stays ruddy.  He has regular headaches sometimes but denies migraines or changes in vision.  No hot flashes or night sweats.  No fever, chills, n/v, cough, rash, dizziness, SOB, chest pain, palpitations, abdominal pain or changes in bowel or bladder habits.  No swelling, tenderness, numbness or tingling in his extremities.  No falls or syncope reported.  Appetite and hydration are good. Weight is stable at 252 lbs.   ECOG Performance Status: 0 - Asymptomatic  Medications:  Allergies as of 12/23/2023   No Known Allergies      Medication List        Accurate as of December 23, 2023  2:22 PM. If you have any questions, ask your nurse or doctor.          Accu-Chek Guide test strip Generic drug: glucose blood TEST 1-4 TIMES DAILY   Accu-Chek Softclix Lancets lancets TEST 1-4 TIMES DAILY   acetaminophen  650 MG CR tablet Commonly known as: TYLENOL  Take 1 tablet (650 mg total) by mouth every 8 (eight) hours as needed for pain.   Adult Blood Pressure Cuff Lg Kit Use as needed   allopurinol  300 MG tablet Commonly known as: ZYLOPRIM  TAKE 1 TABLET BY MOUTH EVERY DAY   AMBULATORY NON FORMULARY MEDICATION Single glucometer with lancets, test strips Test daily.  Disp qs x 3 months. E11.49   AMBULATORY NON FORMULARY MEDICATION Single glucometer with lancets, test strips, patient to test 1-4 times daily.   amLODipine  5 MG tablet Commonly known as: NORVASC  Take 1 tablet (5 mg total) by mouth daily.    cyanocobalamin  1000 MCG tablet Commonly known as: VITAMIN B12 Take 1 tablet (1,000 mcg total) by mouth daily.   cyclobenzaprine  10 MG tablet Commonly known as: FLEXERIL  One half to one tab PO qHS, then increase gradually to one tab TID.   fluticasone  50 MCG/ACT nasal spray Commonly known as: FLONASE  Place 2 sprays into both nostrils daily.   metoprolol  tartrate 100 MG tablet Commonly known as: LOPRESSOR  TAKE 1 TABLET BY MOUTH TWICE A DAY   Ozempic  (0.25 or 0.5 MG/DOSE) 2 MG/3ML Sopn Generic drug: Semaglutide (0.25 or 0.5MG /DOS) INJECT 0.25MG  INTO THE SKIN ONE TIME PER WEEK   promethazine -dextromethorphan 6.25-15 MG/5ML syrup Commonly known as: PROMETHAZINE -DM Take 5 mLs by mouth 4 (four) times daily as needed for cough.   tadalafil  5 MG tablet Commonly known as: CIALIS  TAKE 1 TABLET BY MOUTH DAILY AS NEEDED FOR ERECTILE DYSFUNCTION   testosterone  cypionate 200 MG/ML injection Commonly known as: DEPOTESTOSTERONE CYPIONATE Inject 1 mL (200 mg total) into the muscle every 14 (fourteen) days.   valsartan -hydrochlorothiazide  320-25 MG tablet Commonly known as: DIOVAN -HCT Take 1 tablet by mouth daily.        Allergies: No Known Allergies  Past Medical History, Surgical history, Social history, and Family History were reviewed and updated.  Review of Systems: All other 10 point review of systems is negative.   Physical Exam:  height is 6' 5 (1.956 m) and weight  is 252 lb (114.3 kg). His oral temperature is 97.9 F (36.6 C). His blood pressure is 108/73 and his pulse is 73. His respiration is 18 and oxygen saturation is 99%.   Wt Readings from Last 3 Encounters:  12/23/23 252 lb (114.3 kg)  11/11/23 255 lb 0.6 oz (115.7 kg)  09/30/23 256 lb (116.1 kg)    Ocular: Sclerae unicteric, pupils equal, round and reactive to light Ear-nose-throat: Oropharynx clear, dentition fair Lymphatic: No cervical or supraclavicular adenopathy Lungs no rales or rhonchi, good  excursion bilaterally Heart regular rate and rhythm, no murmur appreciated Abd soft, nontender, positive bowel sounds MSK no focal spinal tenderness, no joint edema Neuro: non-focal, well-oriented, appropriate affect Breasts: Deferred   Lab Results  Component Value Date   WBC 8.1 12/23/2023   HGB 18.2 (H) 12/23/2023   HCT 54.5 (H) 12/23/2023   MCV 93.2 12/23/2023   PLT 192 12/23/2023   Lab Results  Component Value Date   FERRITIN 44 11/11/2023   IRON 113 11/11/2023   TIBC 451 (H) 11/11/2023   UIBC 338 11/11/2023   IRONPCTSAT 25 11/11/2023   Lab Results  Component Value Date   RETICCTPCT 2.0 02/20/2021   RBC 5.85 (H) 12/23/2023   No results found for: KPAFRELGTCHN, LAMBDASER, KAPLAMBRATIO No results found for: IGGSERUM, IGA, IGMSERUM No results found for: STEPHANY CARLOTA BENSON MARKEL EARLA JOANNIE DOC VICK, SPEI   Chemistry      Component Value Date/Time   NA 138 11/11/2023 1301   NA 140 09/23/2023 0814   NA 142 06/18/2017 1425   K 4.0 11/11/2023 1301   K 4.1 06/18/2017 1425   CL 105 11/11/2023 1301   CL 103 06/18/2017 1425   CO2 24 11/11/2023 1301   CO2 26 06/18/2017 1425   BUN 22 (H) 11/11/2023 1301   BUN 19 09/23/2023 0814   BUN 24 (H) 06/18/2017 1425   CREATININE 1.46 (H) 11/11/2023 1301   CREATININE 1.43 (H) 08/02/2022 0833      Component Value Date/Time   CALCIUM 9.2 11/11/2023 1301   CALCIUM 10.0 06/18/2017 1425   ALKPHOS 76 11/11/2023 1301   ALKPHOS 95 (H) 06/18/2017 1425   AST 25 11/11/2023 1301   ALT 29 11/11/2023 1301   ALT 63 (H) 06/18/2017 1425   BILITOT 1.1 11/11/2023 1301       Impression and Plan: [name] is a very pleasant 58 yo caucasian gentleman with hemochromatosis, heterozygous for the H63D mutation. He is taking his baby aspirin daily as directed.  We will proceed with phlebotomy for elevated Hgb/Hct.   Iron studies pending. We will set him up for another phlebotomy if needed.   Follow-up in 8 weeks.    Lauraine Pepper, NP 7/1/20252:22 PM

## 2023-12-23 NOTE — Progress Notes (Signed)
 Jonathan Oliver. presents today for phlebotomy per MD orders. Phlebotomy procedure started at 1435 per Ronal Sayres RN with 16 gauge phlebotomy kit and ended at 1439. 600 grams removed. Patient refused to stay for post phlebotomy observation.   Patient tolerated procedure well. IV needle removed intact.

## 2023-12-24 LAB — IRON AND IRON BINDING CAPACITY (CC-WL,HP ONLY)
Iron: 65 ug/dL (ref 45–182)
Saturation Ratios: 12 % — ABNORMAL LOW (ref 17.9–39.5)
TIBC: 525 ug/dL — ABNORMAL HIGH (ref 250–450)
UIBC: 460 ug/dL — ABNORMAL HIGH (ref 117–376)

## 2024-01-05 ENCOUNTER — Encounter: Payer: Self-pay | Admitting: Sports Medicine

## 2024-01-05 ENCOUNTER — Ambulatory Visit (INDEPENDENT_AMBULATORY_CARE_PROVIDER_SITE_OTHER): Payer: 59 | Admitting: Sports Medicine

## 2024-01-05 VITALS — BP 129/81 | HR 67 | Resp 20 | Ht 77.0 in | Wt 253.0 lb

## 2024-01-05 DIAGNOSIS — E349 Endocrine disorder, unspecified: Secondary | ICD-10-CM | POA: Diagnosis not present

## 2024-01-05 DIAGNOSIS — E1159 Type 2 diabetes mellitus with other circulatory complications: Secondary | ICD-10-CM | POA: Diagnosis not present

## 2024-01-05 DIAGNOSIS — I152 Hypertension secondary to endocrine disorders: Secondary | ICD-10-CM

## 2024-01-05 DIAGNOSIS — E1149 Type 2 diabetes mellitus with other diabetic neurological complication: Secondary | ICD-10-CM

## 2024-01-05 DIAGNOSIS — Z7985 Long-term (current) use of injectable non-insulin antidiabetic drugs: Secondary | ICD-10-CM

## 2024-01-05 DIAGNOSIS — E538 Deficiency of other specified B group vitamins: Secondary | ICD-10-CM

## 2024-01-05 NOTE — Assessment & Plan Note (Signed)
 Currently off of amlodipine  and valsartan /HCTZ, blood pressure well-controlled, we will keep an eye on this.

## 2024-01-05 NOTE — Assessment & Plan Note (Addendum)
 Rechecking diabetes labs.  A1c elevated, increasing to 1 mg Ozempic , recheck A1c in 3 months.

## 2024-01-05 NOTE — Progress Notes (Signed)
    Procedures performed today:    None.  Independent interpretation of notes and tests performed by another provider:   None.  Brief History, Exam, Impression, and Recommendations:    Type 2 diabetes mellitus with neurological complications (HCC) Rechecking diabetes labs.  Hypertension associated with diabetes (HCC) Currently off of amlodipine  and valsartan /HCTZ, blood pressure well-controlled, we will keep an eye on this.  Testosterone  deficiency Jonathan Oliver is doing his testosterone  on and off, he will take it for a while, and then do a washout period. I explained him that under my direction he does not need a washout period, he prefers to do it this way, so we will still monitor him every 6 months with the understanding that if his testosterone  is low we will not need to intervene aggressively.     ____________________________________________ Debby PARAS. Curtis, M.D., ABFM., CAQSM., AME. Primary Care and Sports Medicine [facility]  Adjunct Professor of [facility] Medicine  [employer]  Restaurant manager, fast food

## 2024-01-05 NOTE — Assessment & Plan Note (Signed)
 Jonathan Oliver is doing his testosterone  on and off, he will take it for a while, and then do a washout period. I explained him that under my direction he does not need a washout period, he prefers to do it this way, so we will still monitor him every 6 months with the understanding that if his testosterone  is low we will not need to intervene aggressively.

## 2024-01-06 ENCOUNTER — Ambulatory Visit: Payer: Self-pay | Admitting: Sports Medicine

## 2024-01-07 LAB — COMPREHENSIVE METABOLIC PANEL WITH GFR
ALT: 35 IU/L (ref 0–44)
AST: 33 IU/L (ref 0–40)
Albumin: 4 g/dL (ref 3.8–4.9)
Alkaline Phosphatase: 108 IU/L (ref 44–121)
BUN/Creatinine Ratio: 10 (ref 9–20)
BUN: 13 mg/dL (ref 6–24)
Bilirubin Total: 0.7 mg/dL (ref 0.0–1.2)
CO2: 19 mmol/L — ABNORMAL LOW (ref 20–29)
Calcium: 9.5 mg/dL (ref 8.7–10.2)
Chloride: 105 mmol/L (ref 96–106)
Creatinine, Ser: 1.36 mg/dL — ABNORMAL HIGH (ref 0.76–1.27)
Globulin, Total: 2.9 g/dL (ref 1.5–4.5)
Glucose: 153 mg/dL — ABNORMAL HIGH (ref 70–99)
Potassium: 4.9 mmol/L (ref 3.5–5.2)
Sodium: 138 mmol/L (ref 134–144)
Total Protein: 6.9 g/dL (ref 6.0–8.5)
eGFR: 61 mL/min/1.73 (ref >59)

## 2024-01-07 LAB — TESTOSTERONE, FREE, TOTAL, SHBG
Sex Hormone Binding: 53.5 nmol/L (ref 19.3–76.4)
Testosterone, Free: 7.6 pg/mL (ref 7.2–24.0)
Testosterone: 432 ng/dL (ref 264–916)

## 2024-01-07 LAB — PSA, TOTAL AND FREE
PSA, Free Pct: 42 %
PSA, Free: 0.21 ng/mL
Prostate Specific Ag, Serum: 0.5 ng/mL (ref 0.0–4.0)

## 2024-01-07 LAB — CBC
Hematocrit: 53.3 % — ABNORMAL HIGH (ref 37.5–51.0)
Hemoglobin: 17.6 g/dL (ref 13.0–17.7)
MCH: 30.7 pg (ref 26.6–33.0)
MCHC: 33 g/dL (ref 31.5–35.7)
MCV: 93 fL (ref 79–97)
Platelets: 204 x10E3/uL (ref 150–450)
RBC: 5.74 x10E6/uL (ref 4.14–5.80)
RDW: 12.1 % (ref 11.6–15.4)
WBC: 7.9 x10E3/uL (ref 3.4–10.8)

## 2024-01-07 LAB — LIPID PANEL
Chol/HDL Ratio: 3.9 ratio (ref 0.0–5.0)
Cholesterol, Total: 169 mg/dL (ref 100–199)
HDL: 43 mg/dL (ref >39)
LDL Chol Calc (NIH): 110 mg/dL — ABNORMAL HIGH (ref 0–99)
Triglycerides: 86 mg/dL (ref 0–149)
VLDL Cholesterol Cal: 16 mg/dL (ref 5–40)

## 2024-01-07 LAB — TSH: TSH: 1.75 u[IU]/mL (ref 0.450–4.500)

## 2024-01-07 LAB — MICROALBUMIN / CREATININE URINE RATIO
Creatinine, Urine: 121.7 mg/dL
Microalb/Creat Ratio: 510 mg/g{creat} — ABNORMAL HIGH (ref 0–29)
Microalbumin, Urine: 620.2 ug/mL

## 2024-01-07 LAB — B12 AND FOLATE PANEL
Folate: 8.6 ng/mL (ref >3.0)
Vitamin B-12: >2000 pg/mL — ABNORMAL HIGH (ref 232–1245)

## 2024-01-07 LAB — HEMOGLOBIN A1C
Est. average glucose Bld gHb Est-mCnc: 183 mg/dL
Hgb A1c MFr Bld: 8 % — ABNORMAL HIGH (ref 4.8–5.6)

## 2024-01-07 MED ORDER — OZEMPIC (1 MG/DOSE) 4 MG/3ML ~~LOC~~ SOPN: 1.0000 mg | PEN_INJECTOR | SUBCUTANEOUS | 11 refills | Status: DC

## 2024-01-07 NOTE — Addendum Note (Signed)
 Addended by: CURTIS DEBBY PARAS on: 01/07/2024 02:50 PM   Modules accepted: Orders

## 2024-02-17 ENCOUNTER — Inpatient Hospital Stay: Attending: Family

## 2024-02-17 ENCOUNTER — Inpatient Hospital Stay

## 2024-02-17 ENCOUNTER — Inpatient Hospital Stay (HOSPITAL_BASED_OUTPATIENT_CLINIC_OR_DEPARTMENT_OTHER): Admitting: Family

## 2024-02-17 VITALS — BP 137/87 | HR 81 | Temp 97.7°F | Resp 18 | Ht 77.0 in | Wt 252.8 lb

## 2024-02-17 DIAGNOSIS — D751 Secondary polycythemia: Secondary | ICD-10-CM

## 2024-02-17 DIAGNOSIS — Z7982 Long term (current) use of aspirin: Secondary | ICD-10-CM | POA: Diagnosis not present

## 2024-02-17 LAB — IRON AND IRON BINDING CAPACITY (CC-WL,HP ONLY)
Iron: 39 ug/dL — ABNORMAL LOW (ref 45–182)
Saturation Ratios: 8 % — ABNORMAL LOW (ref 17.9–39.5)
TIBC: 497 ug/dL — ABNORMAL HIGH (ref 250–450)
UIBC: 458 ug/dL

## 2024-02-17 LAB — CMP (CANCER CENTER ONLY)
ALT: 43 U/L (ref 0–44)
AST: 36 U/L (ref 15–41)
Albumin: 4.2 g/dL (ref 3.5–5.0)
Alkaline Phosphatase: 88 U/L (ref 38–126)
Anion gap: 11 (ref 5–15)
BUN: 19 mg/dL (ref 6–20)
CO2: 24 mmol/L (ref 22–32)
Calcium: 9.5 mg/dL (ref 8.9–10.3)
Chloride: 105 mmol/L (ref 98–111)
Creatinine: 1.5 mg/dL — ABNORMAL HIGH (ref 0.61–1.24)
GFR, Estimated: 54 mL/min — ABNORMAL LOW (ref >60)
Glucose, Bld: 94 mg/dL (ref 70–99)
Potassium: 4.7 mmol/L (ref 3.5–5.1)
Sodium: 139 mmol/L (ref 135–145)
Total Bilirubin: 0.5 mg/dL (ref 0.0–1.2)
Total Protein: 7.1 g/dL (ref 6.5–8.1)

## 2024-02-17 LAB — CBC WITH DIFFERENTIAL (CANCER CENTER ONLY)
Abs Immature Granulocytes: 0.04 K/uL (ref 0.00–0.07)
Basophils Absolute: 0.1 K/uL (ref 0.0–0.1)
Basophils Relative: 1 %
Eosinophils Absolute: 0.2 K/uL (ref 0.0–0.5)
Eosinophils Relative: 2 %
HCT: 51.3 % (ref 39.0–52.0)
Hemoglobin: 16.8 g/dL (ref 13.0–17.0)
Immature Granulocytes: 1 %
Lymphocytes Relative: 21 %
Lymphs Abs: 1.6 K/uL (ref 0.7–4.0)
MCH: 29.4 pg (ref 26.0–34.0)
MCHC: 32.7 g/dL (ref 30.0–36.0)
MCV: 89.7 fL (ref 80.0–100.0)
Monocytes Absolute: 0.9 K/uL (ref 0.1–1.0)
Monocytes Relative: 12 %
Neutro Abs: 4.8 K/uL (ref 1.7–7.7)
Neutrophils Relative %: 63 %
Platelet Count: 192 K/uL (ref 150–400)
RBC: 5.72 MIL/uL (ref 4.22–5.81)
RDW: 14.2 % (ref 11.5–15.5)
WBC Count: 7.6 K/uL (ref 4.0–10.5)
nRBC: 0 % (ref 0.0–0.2)

## 2024-02-17 LAB — FERRITIN: Ferritin: 17 ng/mL — ABNORMAL LOW (ref 24–336)

## 2024-02-17 NOTE — Progress Notes (Signed)
 Hematology and Oncology Follow Up Visit  Srikar Chiang [id number] [birth year] 58 y.o. 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 Micale is here today for follow-up. He is doing well but does note tension headaches off and on.  Hgb is improved at 16.8/Hct 51%. Iron studies are pending.  No bruising or petechiae.  He states that BP and glucose have been well controlled recently.  No fever, chills, n/v, cough, rash, dizziness, SOB, chest pain, palpitations, abdominal pain or changes in bowel or bladder habits.  No swelling, tenderness, numbness or tingling in his extremities.  No falls or syncope.  Appetite and hydration are good. Weight is stable at 252 lbs.   ECOG Performance Status: 1 - Symptomatic but completely ambulatory  Medications:  Allergies as of 02/17/2024   No Known Allergies      Medication List        Accurate as of February 17, 2024  1:34 PM. If you have any questions, ask your nurse or doctor.          Accu-Chek Guide test strip Generic drug: glucose blood TEST 1-4 TIMES DAILY   Accu-Chek Softclix Lancets lancets TEST 1-4 TIMES DAILY   acetaminophen  650 MG CR tablet Commonly known as: TYLENOL  Take 1 tablet (650 mg total) by mouth every 8 (eight) hours as needed for pain.   Adult Blood Pressure Cuff Lg Kit Use as needed   allopurinol  300 MG tablet Commonly known as: ZYLOPRIM  TAKE 1 TABLET BY MOUTH EVERY DAY   AMBULATORY NON FORMULARY MEDICATION Single glucometer with lancets, test strips Test daily.  Disp qs x 3 months. E11.49   AMBULATORY NON FORMULARY MEDICATION Single glucometer with lancets, test strips, patient to test 1-4 times daily.   cyanocobalamin  1000 MCG tablet Commonly known as: VITAMIN B12 Take 1 tablet  (1,000 mcg total) by mouth daily.   cyclobenzaprine  10 MG tablet Commonly known as: FLEXERIL  One half to one tab PO qHS, then increase gradually to one tab TID.   fluticasone  50 MCG/ACT nasal spray Commonly known as: FLONASE  Place 2 sprays into both nostrils daily.   metoprolol  tartrate 100 MG tablet Commonly known as: LOPRESSOR  TAKE 1 TABLET BY MOUTH TWICE A DAY   Ozempic  (1 MG/DOSE) 4 MG/3ML Sopn Generic drug: Semaglutide  (1 MG/DOSE) Inject 1 mg into the skin once a week.   promethazine -dextromethorphan 6.25-15 MG/5ML syrup Commonly known as: PROMETHAZINE -DM Take 5 mLs by mouth 4 (four) times daily as needed for cough.   tadalafil  5 MG tablet Commonly known as: CIALIS  TAKE 1 TABLET BY MOUTH DAILY AS NEEDED FOR ERECTILE DYSFUNCTION   testosterone  cypionate 200 MG/ML injection Commonly known as: DEPOTESTOSTERONE CYPIONATE Inject 1 mL (200 mg total) into the muscle every 14 (fourteen) days.        Allergies: No Known Allergies  Past Medical History, Surgical history, Social history, and Family History were reviewed and updated.  Review of Systems: All other 10 point review of systems is negative.   Physical Exam:  height is 6' 5 (1.956 m) and weight is 252 lb 12.8 oz (114.7 kg). His oral temperature is 97.7 F (36.5 C). His blood pressure is 137/87 and his pulse is 81. His respiration  is 18 and oxygen saturation is 99%.   Wt Readings from Last 3 Encounters:  02/17/24 252 lb 12.8 oz (114.7 kg)  01/05/24 253 lb (114.8 kg)  12/23/23 252 lb (114.3 kg)    Ocular: Sclerae unicteric, pupils equal, round and reactive to light Ear-nose-throat: Oropharynx clear, dentition fair Lymphatic: No cervical or supraclavicular adenopathy Lungs no rales or rhonchi, good excursion bilaterally Heart regular rate and rhythm, no murmur appreciated Abd soft, nontender, positive bowel sounds MSK no focal spinal tenderness, no joint edema Neuro: non-focal, well-oriented, appropriate  affect Breasts: Deferred   Lab Results  Component Value Date   WBC 7.6 02/17/2024   HGB 16.8 02/17/2024   HCT 51.3 02/17/2024   MCV 89.7 02/17/2024   PLT 192 02/17/2024   Lab Results  Component Value Date   FERRITIN 27 12/23/2023   IRON 65 12/23/2023   TIBC 525 (H) 12/23/2023   UIBC 460 (H) 12/23/2023   IRONPCTSAT 12 (L) 12/23/2023   Lab Results  Component Value Date   RETICCTPCT 2.0 02/20/2021   RBC 5.72 02/17/2024   No results found for: KPAFRELGTCHN, LAMBDASER, KAPLAMBRATIO No results found for: IGGSERUM, IGA, IGMSERUM No results found for: STEPHANY CARLOTA BENSON MARKEL EARLA JOANNIE DOC VICK, SPEI   Chemistry      Component Value Date/Time   NA 138 01/05/2024 1101   NA 142 06/18/2017 1425   K 4.9 01/05/2024 1101   K 4.1 06/18/2017 1425   CL 105 01/05/2024 1101   CL 103 06/18/2017 1425   CO2 19 (L) 01/05/2024 1101   CO2 26 06/18/2017 1425   BUN 13 01/05/2024 1101   BUN 24 (H) 06/18/2017 1425   CREATININE 1.36 (H) 01/05/2024 1101   CREATININE 1.67 (H) 12/23/2023 1351   CREATININE 1.43 (H) 08/02/2022 0833      Component Value Date/Time   CALCIUM 9.5 01/05/2024 1101   CALCIUM 10.0 06/18/2017 1425   ALKPHOS 108 01/05/2024 1101   ALKPHOS 95 (H) 06/18/2017 1425   AST 33 01/05/2024 1101   AST 30 12/23/2023 1351   ALT 35 01/05/2024 1101   ALT 34 12/23/2023 1351   ALT 63 (H) 06/18/2017 1425   BILITOT 0.7 01/05/2024 1101   BILITOT 0.7 12/23/2023 1351       Impression and Plan: Mr. Brandl is a very pleasant 58 yo caucasian gentleman with hemochromatosis, heterozygous for the H63D mutation. He is taking his baby aspirin daily as directed.  Iron studies pending. We will set him up for another phlebotomy if needed.  Follow-up in 8 weeks.   Lauraine Pepper, NP 8/26/20251:34 PM

## 2024-02-24 ENCOUNTER — Encounter: Payer: Self-pay | Admitting: Sports Medicine

## 2024-04-13 ENCOUNTER — Inpatient Hospital Stay

## 2024-04-13 ENCOUNTER — Inpatient Hospital Stay: Attending: Family

## 2024-04-13 ENCOUNTER — Inpatient Hospital Stay: Admitting: Family

## 2024-04-13 ENCOUNTER — Encounter: Payer: Self-pay | Admitting: Family

## 2024-04-13 VITALS — BP 125/67 | HR 61

## 2024-04-13 VITALS — BP 112/83 | HR 60 | Temp 98.1°F | Resp 18 | Wt 249.1 lb

## 2024-04-13 DIAGNOSIS — Z7982 Long term (current) use of aspirin: Secondary | ICD-10-CM | POA: Diagnosis not present

## 2024-04-13 DIAGNOSIS — D751 Secondary polycythemia: Secondary | ICD-10-CM

## 2024-04-13 LAB — CMP (CANCER CENTER ONLY)
ALT: 32 U/L (ref 0–44)
AST: 30 U/L (ref 15–41)
Albumin: 4 g/dL (ref 3.5–5.0)
Alkaline Phosphatase: 87 U/L (ref 38–126)
Anion gap: 12 (ref 5–15)
BUN: 27 mg/dL — ABNORMAL HIGH (ref 6–20)
CO2: 21 mmol/L — ABNORMAL LOW (ref 22–32)
Calcium: 9.6 mg/dL (ref 8.9–10.3)
Chloride: 103 mmol/L (ref 98–111)
Creatinine: 1.39 mg/dL — ABNORMAL HIGH (ref 0.61–1.24)
GFR, Estimated: 59 mL/min — ABNORMAL LOW (ref >60)
Glucose, Bld: 166 mg/dL — ABNORMAL HIGH (ref 70–99)
Potassium: 4.3 mmol/L (ref 3.5–5.1)
Sodium: 136 mmol/L (ref 135–145)
Total Bilirubin: 1.1 mg/dL (ref 0.0–1.2)
Total Protein: 7.1 g/dL (ref 6.5–8.1)

## 2024-04-13 LAB — CBC WITH DIFFERENTIAL (CANCER CENTER ONLY)
Abs Immature Granulocytes: 0.05 K/uL (ref 0.00–0.07)
Basophils Absolute: 0.1 K/uL (ref 0.0–0.1)
Basophils Relative: 1 %
Eosinophils Absolute: 0.2 K/uL (ref 0.0–0.5)
Eosinophils Relative: 2 %
HCT: 55 % — ABNORMAL HIGH (ref 39.0–52.0)
Hemoglobin: 18.2 g/dL — ABNORMAL HIGH (ref 13.0–17.0)
Immature Granulocytes: 1 %
Lymphocytes Relative: 20 %
Lymphs Abs: 1.5 K/uL (ref 0.7–4.0)
MCH: 29.1 pg (ref 26.0–34.0)
MCHC: 33.1 g/dL (ref 30.0–36.0)
MCV: 87.9 fL (ref 80.0–100.0)
Monocytes Absolute: 0.9 K/uL (ref 0.1–1.0)
Monocytes Relative: 12 %
Neutro Abs: 4.6 K/uL (ref 1.7–7.7)
Neutrophils Relative %: 64 %
Platelet Count: 188 K/uL (ref 150–400)
RBC: 6.26 MIL/uL — ABNORMAL HIGH (ref 4.22–5.81)
RDW: 15.8 % — ABNORMAL HIGH (ref 11.5–15.5)
WBC Count: 7.3 K/uL (ref 4.0–10.5)
nRBC: 0 % (ref 0.0–0.2)

## 2024-04-13 LAB — IRON AND IRON BINDING CAPACITY (CC-WL,HP ONLY)
Iron: 188 ug/dL — ABNORMAL HIGH (ref 45–182)
Saturation Ratios: 38 % (ref 17.9–39.5)
TIBC: 494 ug/dL — ABNORMAL HIGH (ref 250–450)
UIBC: 306 ug/dL

## 2024-04-13 LAB — FERRITIN: Ferritin: 32 ng/mL (ref 24–336)

## 2024-04-13 NOTE — Progress Notes (Signed)
 Hematology and Oncology Follow Up Visit  Jonathan Oliver [id number] [birth year] 58 y.o. 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 Tibbitts is here today for follow-up. He is doing well and has no complaints at this time.  Hgb is up to 18.2, MCV 87, platelets 188 and WBC count is 7.3.  He has the occasional headaches that comes and goes.  No fever, chills, n/v, cough, rash, dizziness, SOB, chest pain, palpitations, abdominal pain or changes in bowel or bladder habits.  No swelling in his extremities.  Neuropathy in his feet unchanged from baseline.  No falls or syncope reported.  Appetite is good but hydration he admits needs to be better. Weight is stable at 249 lbs.  No blood loss. No bruising or petechiae.   ECOG Performance Status: 1 - Symptomatic but completely ambulatory  Medications:  Allergies as of 04/13/2024   No Known Allergies      Medication List        Accurate as of April 13, 2024  1:25 PM. If you have any questions, ask your nurse or doctor.          Accu-Chek Guide test strip Generic drug: glucose blood TEST 1-4 TIMES DAILY   Accu-Chek Softclix Lancets lancets TEST 1-4 TIMES DAILY   acetaminophen  650 MG CR tablet Commonly known as: TYLENOL  Take 1 tablet (650 mg total) by mouth every 8 (eight) hours as needed for pain.   Adult Blood Pressure Cuff Lg Kit Use as needed   allopurinol  300 MG tablet Commonly known as: ZYLOPRIM  TAKE 1 TABLET BY MOUTH EVERY DAY   AMBULATORY NON FORMULARY MEDICATION Single glucometer with lancets, test strips Test daily.  Disp qs x 3 months. E11.49   AMBULATORY NON FORMULARY MEDICATION Single glucometer with lancets, test strips, patient to test 1-4 times  daily.   cyanocobalamin  1000 MCG tablet Commonly known as: VITAMIN B12 Take 1 tablet (1,000 mcg total) by mouth daily.   cyclobenzaprine  10 MG tablet Commonly known as: FLEXERIL  One half to one tab PO qHS, then increase gradually to one tab TID.   fluticasone  50 MCG/ACT nasal spray Commonly known as: FLONASE  Place 2 sprays into both nostrils daily.   metoprolol  tartrate 100 MG tablet Commonly known as: LOPRESSOR  TAKE 1 TABLET BY MOUTH TWICE A DAY   Ozempic  (1 MG/DOSE) 4 MG/3ML Sopn Generic drug: Semaglutide  (1 MG/DOSE) Inject 1 mg into the skin once a week.   promethazine -dextromethorphan 6.25-15 MG/5ML syrup Commonly known as: PROMETHAZINE -DM Take 5 mLs by mouth 4 (four) times daily as needed for cough.   tadalafil  5 MG tablet Commonly known as: CIALIS  TAKE 1 TABLET BY MOUTH DAILY AS NEEDED FOR ERECTILE DYSFUNCTION   testosterone  cypionate 200 MG/ML injection Commonly known as: DEPOTESTOSTERONE CYPIONATE Inject 1 mL (200 mg total) into the muscle every 14 (fourteen) days.        Allergies: No Known Allergies  Past Medical History, Surgical history, Social history, and Family History were reviewed and updated.  Review of Systems: All other 10 point review of systems is negative.   Physical Exam:  weight is 249 lb 1.9 oz (  113 kg). His oral temperature is 98.1 F (36.7 C). His blood pressure is 112/83 and his pulse is 60. His respiration is 18 and oxygen saturation is 97%.   Wt Readings from Last 3 Encounters:  04/13/24 249 lb 1.9 oz (113 kg)  02/17/24 252 lb 12.8 oz (114.7 kg)  01/05/24 253 lb (114.8 kg)    Ocular: Sclerae unicteric, pupils equal, round and reactive to light Ear-nose-throat: Oropharynx clear, dentition fair Lymphatic: No cervical or supraclavicular adenopathy Lungs no rales or rhonchi, good excursion bilaterally Heart regular rate and rhythm, no murmur appreciated Abd soft, nontender, positive bowel sounds MSK no focal spinal tenderness, no  joint edema Neuro: non-focal, well-oriented, appropriate affect Breasts: Deferred   Lab Results  Component Value Date   WBC 7.6 02/17/2024   HGB 16.8 02/17/2024   HCT 51.3 02/17/2024   MCV 89.7 02/17/2024   PLT 192 02/17/2024   Lab Results  Component Value Date   FERRITIN 17 (L) 02/17/2024   IRON 39 (L) 02/17/2024   TIBC 497 (H) 02/17/2024   UIBC 458 02/17/2024   IRONPCTSAT 8 (L) 02/17/2024   Lab Results  Component Value Date   RETICCTPCT 2.0 02/20/2021   RBC 5.72 02/17/2024   No results found for: KPAFRELGTCHN, LAMBDASER, KAPLAMBRATIO No results found for: IGGSERUM, IGA, IGMSERUM No results found for: STEPHANY CARLOTA BENSON MARKEL EARLA JOANNIE DOC VICK, SPEI   Chemistry      Component Value Date/Time   NA 139 02/17/2024 1304   NA 138 01/05/2024 1101   NA 142 06/18/2017 1425   K 4.7 02/17/2024 1304   K 4.1 06/18/2017 1425   CL 105 02/17/2024 1304   CL 103 06/18/2017 1425   CO2 24 02/17/2024 1304   CO2 26 06/18/2017 1425   BUN 19 02/17/2024 1304   BUN 13 01/05/2024 1101   BUN 24 (H) 06/18/2017 1425   CREATININE 1.50 (H) 02/17/2024 1304   CREATININE 1.43 (H) 08/02/2022 0833      Component Value Date/Time   CALCIUM 9.5 02/17/2024 1304   CALCIUM 10.0 06/18/2017 1425   ALKPHOS 88 02/17/2024 1304   ALKPHOS 95 (H) 06/18/2017 1425   AST 36 02/17/2024 1304   ALT 43 02/17/2024 1304   ALT 63 (H) 06/18/2017 1425   BILITOT 0.5 02/17/2024 1304       Impression and Plan: Mr. Gadson is a very pleasant 58 yo caucasian gentleman with hemochromatosis, heterozygous for the H63D mutation. He is taking his baby aspirin daily as directed.  We will proceed with phlebotomy today for high Hgb.  Iron studies pending. We will set him up for another phlebotomy if needed.  Follow-up in 8 weeks.   Lauraine Pepper, NP 10/21/20251:25 PM

## 2024-04-13 NOTE — Progress Notes (Signed)
 Jonathan Oliver. presents today for phlebotomy per MD orders. Phlebotomy procedure started at 1340 and ended at 1350. 500 grams removed. Patient refused to be observed for 30 minutes after procedure. Patient tolerated procedure well. IV needle removed intact.

## 2024-04-14 ENCOUNTER — Ambulatory Visit: Payer: Self-pay

## 2024-04-14 NOTE — Telephone Encounter (Signed)
 Advised via MyChart.

## 2024-04-14 NOTE — Telephone Encounter (Signed)
-----   Message from Lauraine Pepper sent at 04/13/2024  9:46 PM EDT ----- No other phlebotomy needed. Iron studies are stable right now.   ----- Message ----- From: Interface, [facility] Sent: 04/13/2024   1:25 PM EDT To: Lauraine CHRISTELLA Pepper, NP

## 2024-06-14 ENCOUNTER — Inpatient Hospital Stay: Admitting: Family

## 2024-06-14 ENCOUNTER — Encounter: Payer: Self-pay | Admitting: Family

## 2024-06-14 ENCOUNTER — Inpatient Hospital Stay

## 2024-06-14 ENCOUNTER — Inpatient Hospital Stay: Attending: Family

## 2024-06-14 VITALS — BP 149/103 | HR 80 | Temp 98.3°F | Resp 18 | Wt 256.8 lb

## 2024-06-14 VITALS — BP 130/95 | HR 52 | Resp 18

## 2024-06-14 DIAGNOSIS — D751 Secondary polycythemia: Secondary | ICD-10-CM | POA: Diagnosis not present

## 2024-06-14 DIAGNOSIS — Z7982 Long term (current) use of aspirin: Secondary | ICD-10-CM | POA: Diagnosis not present

## 2024-06-14 LAB — CBC WITH DIFFERENTIAL (CANCER CENTER ONLY)
Abs Immature Granulocytes: 0.05 K/uL (ref 0.00–0.07)
Basophils Absolute: 0.1 K/uL (ref 0.0–0.1)
Basophils Relative: 2 %
Eosinophils Absolute: 0.3 K/uL (ref 0.0–0.5)
Eosinophils Relative: 3 %
HCT: 55.4 % — ABNORMAL HIGH (ref 39.0–52.0)
Hemoglobin: 18.7 g/dL — ABNORMAL HIGH (ref 13.0–17.0)
Immature Granulocytes: 1 %
Lymphocytes Relative: 20 %
Lymphs Abs: 1.7 K/uL (ref 0.7–4.0)
MCH: 29.4 pg (ref 26.0–34.0)
MCHC: 33.8 g/dL (ref 30.0–36.0)
MCV: 87.1 fL (ref 80.0–100.0)
Monocytes Absolute: 1.1 K/uL — ABNORMAL HIGH (ref 0.1–1.0)
Monocytes Relative: 13 %
Neutro Abs: 5.1 K/uL (ref 1.7–7.7)
Neutrophils Relative %: 61 %
Platelet Count: 183 K/uL (ref 150–400)
RBC: 6.36 MIL/uL — ABNORMAL HIGH (ref 4.22–5.81)
RDW: 15.3 % (ref 11.5–15.5)
WBC Count: 8.3 K/uL (ref 4.0–10.5)
nRBC: 0 % (ref 0.0–0.2)

## 2024-06-14 LAB — CMP (CANCER CENTER ONLY)
ALT: 38 U/L (ref 0–44)
AST: 31 U/L (ref 15–41)
Albumin: 4.1 g/dL (ref 3.5–5.0)
Alkaline Phosphatase: 99 U/L (ref 38–126)
Anion gap: 12 (ref 5–15)
BUN: 17 mg/dL (ref 6–20)
CO2: 26 mmol/L (ref 22–32)
Calcium: 9.8 mg/dL (ref 8.9–10.3)
Chloride: 102 mmol/L (ref 98–111)
Creatinine: 1.43 mg/dL — ABNORMAL HIGH (ref 0.61–1.24)
GFR, Estimated: 58 mL/min — ABNORMAL LOW
Glucose, Bld: 137 mg/dL — ABNORMAL HIGH (ref 70–99)
Potassium: 4.2 mmol/L (ref 3.5–5.1)
Sodium: 140 mmol/L (ref 135–145)
Total Bilirubin: 0.6 mg/dL (ref 0.0–1.2)
Total Protein: 7.6 g/dL (ref 6.5–8.1)

## 2024-06-14 LAB — FERRITIN: Ferritin: 26 ng/mL (ref 24–336)

## 2024-06-14 LAB — IRON AND IRON BINDING CAPACITY (CC-WL,HP ONLY)
Iron: 59 ug/dL (ref 45–182)
Saturation Ratios: 12 % — ABNORMAL LOW (ref 17.9–39.5)
TIBC: 500 ug/dL — ABNORMAL HIGH (ref 250–450)
UIBC: 441 ug/dL

## 2024-06-14 NOTE — Patient Instructions (Signed)

## 2024-06-14 NOTE — Progress Notes (Signed)
 " Hematology and Oncology Follow Up Visit  Jonathan Oliver [id number] 30-Sep-1965 58 y.o. 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 Jonathan Oliver is here today for follow-up. He is doing well but has noticed a headache the last couple days.  His cheeks are a little ruddy today.  BP and HR stable at 129/86 and 80.  No fever, chills, n/v, cough, rash, dizziness, SOB, chest pain, palpitations, abdominal pain or changes in bowel or bladder habits.  No swelling in his extremities.  Neuropathy in his feet unchanged from baseline.  No falls or syncope.  Appetite is good and he is doing his best to stay well hydrated. Weight is stable at 256 lbs.   ECOG Performance Status: 1 - Symptomatic but completely ambulatory  Medications:  Allergies as of 06/14/2024   No Known Allergies      Medication List        Accurate as of June 14, 2024  1:05 PM. If you have any questions, ask your nurse or doctor.          Accu-Chek Guide test strip Generic drug: glucose blood TEST 1-4 TIMES DAILY   Accu-Chek Softclix Lancets lancets TEST 1-4 TIMES DAILY   acetaminophen  650 MG CR tablet Commonly known as: TYLENOL  Take 1 tablet (650 mg total) by mouth every 8 (eight) hours as needed for pain.   Adult Blood Pressure Cuff Lg Kit Use as needed   allopurinol  300 MG tablet Commonly known as: ZYLOPRIM  TAKE 1 TABLET BY MOUTH EVERY DAY   AMBULATORY NON FORMULARY MEDICATION Single glucometer with lancets, test strips Test daily.  Disp qs x 3 months. E11.49   AMBULATORY NON FORMULARY MEDICATION Single glucometer with lancets, test strips, patient to test 1-4 times daily.   cyanocobalamin  1000 MCG tablet Commonly known as: VITAMIN B12 Take  1 tablet (1,000 mcg total) by mouth daily.   cyclobenzaprine  10 MG tablet Commonly known as: FLEXERIL  One half to one tab PO qHS, then increase gradually to one tab TID.   fluticasone  50 MCG/ACT nasal spray Commonly known as: FLONASE  Place 2 sprays into both nostrils daily.   metoprolol  tartrate 100 MG tablet Commonly known as: LOPRESSOR  TAKE 1 TABLET BY MOUTH TWICE A DAY   Ozempic  (1 MG/DOSE) 4 MG/3ML Sopn Generic drug: Semaglutide  (1 MG/DOSE) Inject 1 mg into the skin once a week.   promethazine -dextromethorphan 6.25-15 MG/5ML syrup Commonly known as: PROMETHAZINE -DM Take 5 mLs by mouth 4 (four) times daily as needed for cough.   tadalafil  5 MG tablet Commonly known as: CIALIS  TAKE 1 TABLET BY MOUTH DAILY AS NEEDED FOR ERECTILE DYSFUNCTION   testosterone  cypionate 200 MG/ML injection Commonly known as: DEPOTESTOSTERONE CYPIONATE Inject 1 mL (200 mg total) into the muscle every 14 (fourteen) days.        Allergies: Allergies[1]  Past Medical History, Surgical history, Social history, and Family History were reviewed and updated.  Review of Systems: All other 10 point review of systems is negative.   Physical Exam:  vitals were not taken for this visit.   Wt Readings from Last 3 Encounters:  04/13/24 249  lb 1.9 oz (113 kg)  02/17/24 252 lb 12.8 oz (114.7 kg)  01/05/24 253 lb (114.8 kg)    Ocular: Sclerae unicteric, pupils equal, round and reactive to light Ear-nose-throat: Oropharynx clear, dentition fair Lymphatic: No cervical or supraclavicular adenopathy Lungs no rales or rhonchi, good excursion bilaterally Heart regular rate and rhythm, no murmur appreciated Abd soft, nontender, positive bowel sounds MSK no focal spinal tenderness, no joint edema Neuro: non-focal, well-oriented, appropriate affect Breasts: Deferred   Lab Results  Component Value Date   WBC 7.3 04/13/2024   HGB 18.2 (H) 04/13/2024   HCT 55.0 (H) 04/13/2024   MCV 87.9 04/13/2024    PLT 188 04/13/2024   Lab Results  Component Value Date   FERRITIN 32 04/13/2024   IRON 188 (H) 04/13/2024   TIBC 494 (H) 04/13/2024   UIBC 306 04/13/2024   IRONPCTSAT 38 04/13/2024   Lab Results  Component Value Date   RETICCTPCT 2.0 02/20/2021   RBC 6.26 (H) 04/13/2024   No results found for: KPAFRELGTCHN, LAMBDASER, KAPLAMBRATIO No results found for: IGGSERUM, IGA, IGMSERUM No results found for: STEPHANY CARLOTA BENSON MARKEL EARLA JOANNIE DOC VICK, SPEI   Chemistry      Component Value Date/Time   NA 136 04/13/2024 1301   NA 138 01/05/2024 1101   NA 142 06/18/2017 1425   K 4.3 04/13/2024 1301   K 4.1 06/18/2017 1425   CL 103 04/13/2024 1301   CL 103 06/18/2017 1425   CO2 21 (L) 04/13/2024 1301   CO2 26 06/18/2017 1425   BUN 27 (H) 04/13/2024 1301   BUN 13 01/05/2024 1101   BUN 24 (H) 06/18/2017 1425   CREATININE 1.39 (H) 04/13/2024 1301   CREATININE 1.43 (H) 08/02/2022 0833      Component Value Date/Time   CALCIUM 9.6 04/13/2024 1301   CALCIUM 10.0 06/18/2017 1425   ALKPHOS 87 04/13/2024 1301   ALKPHOS 95 (H) 06/18/2017 1425   AST 30 04/13/2024 1301   ALT 32 04/13/2024 1301   ALT 63 (H) 06/18/2017 1425   BILITOT 1.1 04/13/2024 1301       Impression and Plan: [name] is a very pleasant 58 yo caucasian gentleman with hemochromatosis, heterozygous for the H63D mutation. He is taking his baby aspirin daily as directed.  We will proceed with phlebotomy today for high Hgb/Hct.  Iron studies pending. We will set him up for another phlebotomy if needed.  Follow-up in 8 weeks.   Lauraine Pepper, NP 12/22/20251:05 PM     [1] No Known Allergies  "

## 2024-06-14 NOTE — Progress Notes (Signed)
 One unit phlebotomy taken from left ac area without incident.  End bag weight was 550 gms.  Patient given nourishments after.

## 2024-07-07 ENCOUNTER — Ambulatory Visit

## 2024-07-07 ENCOUNTER — Other Ambulatory Visit: Payer: Self-pay

## 2024-07-07 ENCOUNTER — Ambulatory Visit (INDEPENDENT_AMBULATORY_CARE_PROVIDER_SITE_OTHER): Admitting: Urgent Care

## 2024-07-07 ENCOUNTER — Ambulatory Visit: Payer: Self-pay | Admitting: Urgent Care

## 2024-07-07 VITALS — BP 115/77 | HR 76 | Ht 77.0 in | Wt 256.0 lb

## 2024-07-07 DIAGNOSIS — E538 Deficiency of other specified B group vitamins: Secondary | ICD-10-CM

## 2024-07-07 DIAGNOSIS — A4902 Methicillin resistant Staphylococcus aureus infection, unspecified site: Secondary | ICD-10-CM | POA: Insufficient documentation

## 2024-07-07 DIAGNOSIS — E1159 Type 2 diabetes mellitus with other circulatory complications: Secondary | ICD-10-CM | POA: Diagnosis not present

## 2024-07-07 DIAGNOSIS — N200 Calculus of kidney: Secondary | ICD-10-CM | POA: Insufficient documentation

## 2024-07-07 DIAGNOSIS — R0609 Other forms of dyspnea: Secondary | ICD-10-CM

## 2024-07-07 DIAGNOSIS — E349 Endocrine disorder, unspecified: Secondary | ICD-10-CM | POA: Diagnosis not present

## 2024-07-07 DIAGNOSIS — E1149 Type 2 diabetes mellitus with other diabetic neurological complication: Secondary | ICD-10-CM | POA: Diagnosis not present

## 2024-07-07 DIAGNOSIS — M1000 Idiopathic gout, unspecified site: Secondary | ICD-10-CM

## 2024-07-07 DIAGNOSIS — E782 Mixed hyperlipidemia: Secondary | ICD-10-CM

## 2024-07-07 DIAGNOSIS — Z7985 Long-term (current) use of injectable non-insulin antidiabetic drugs: Secondary | ICD-10-CM

## 2024-07-07 DIAGNOSIS — M1990 Unspecified osteoarthritis, unspecified site: Secondary | ICD-10-CM | POA: Insufficient documentation

## 2024-07-07 DIAGNOSIS — I4891 Unspecified atrial fibrillation: Secondary | ICD-10-CM | POA: Diagnosis not present

## 2024-07-07 DIAGNOSIS — T7840XA Allergy, unspecified, initial encounter: Secondary | ICD-10-CM | POA: Insufficient documentation

## 2024-07-07 DIAGNOSIS — T8090XA Unspecified complication following infusion and therapeutic injection, initial encounter: Secondary | ICD-10-CM | POA: Diagnosis not present

## 2024-07-07 DIAGNOSIS — E119 Type 2 diabetes mellitus without complications: Secondary | ICD-10-CM | POA: Insufficient documentation

## 2024-07-07 DIAGNOSIS — D751 Secondary polycythemia: Secondary | ICD-10-CM | POA: Diagnosis not present

## 2024-07-07 DIAGNOSIS — I4510 Unspecified right bundle-branch block: Secondary | ICD-10-CM | POA: Diagnosis not present

## 2024-07-07 DIAGNOSIS — N5201 Erectile dysfunction due to arterial insufficiency: Secondary | ICD-10-CM

## 2024-07-07 MED ORDER — APIXABAN (ELIQUIS) VTE STARTER PACK (10MG AND 5MG): ORAL_TABLET | ORAL | 0 refills | Status: DC

## 2024-07-07 MED ORDER — ALLOPURINOL 300 MG PO TABS: 300.0000 mg | ORAL_TABLET | Freq: Every day | ORAL | 3 refills | Status: AC

## 2024-07-07 MED ORDER — JATENZO 237 MG PO CAPS: 1.0000 | ORAL_CAPSULE | Freq: Two times a day (BID) | ORAL | 5 refills | Status: AC

## 2024-07-07 NOTE — Progress Notes (Signed)
 "  Established Patient Office Visit  Subjective:  Patient ID: Jonathan Oliver., male    DOB: [birth year]  Age: 59 y.o. MRN: [id number]  Chief Complaint  Patient presents with   Follow-up    Complains of SOB on exertion x 1 month    HPI  Discussed the use of AI scribe software for clinical note transcription with the patient, who gave verbal consent to proceed.  History of Present Illness   Jonathan Oliver. is a 59 year old male with type 2 diabetes and hereditary hemochromatosis who presents for management of blood sugar and testosterone  levels.  He has type 2 diabetes managed with Ozempic  1 mg weekly. His blood sugar readings at home range from 120 to 200 mg/dL, and his last J8r in July was 8.0%. He previously tried metformin but discontinued it due to gastrointestinal side effects.  He has hereditary hemochromatosis and follows up with hematology every couple of months. His hemoglobin levels have been high in the past, requiring more frequent monitoring, but are now stable. He experiences exertional shortness of breath, particularly after physical activity. He reports exertional dyspnea over the past month. He has an active job and reports heavy lifting has caused him to feel SOB, resting resolves the SOB. No CP. No cough or fever. He reports mild exertion, such as walking to the mailbox does not exacerbate his symptoms. He denies much caffeine intake and states intermittent alcohol intake, last intake was several beers last night.  He was previously on testosterone  injections (1 mL every other week) but stopped due to elevated hemoglobin levels. He experiences symptoms of low testosterone , including fatigue, and noted improvement while on testosterone  therapy. He has tried testosterone  gels in the past but discontinued them due to the risk of transmission to his grandchild. He has had injection site reactions in the past and elevated Hgb has prevented its use. He has not used testosterone  for  the past year, but no improvement in hgb levels noted.   His current medications include allopurinol , metoprolol  for blood pressure, daily Cialis , oral B12 supplements, and baby aspirin. He does not smoke and consumes alcohol occasionally.  He has a history of sleep apnea, previously treated with a uvelectomy, and denies current treatment for sleep apnea. He denies chest pain and irregular heartbeats, but reports shortness of breath with heavy lifting, which is a new symptom for him over the past month.       Patient Active Problem List   Diagnosis Date Noted   MRSA (methicillin resistant Staphylococcus aureus)    Kidney stones    Diabetes mellitus without complication (HCC)    Arthritis    Allergy    Skin tag 01/03/2023   Diabetic nephropathy associated with type 2 diabetes mellitus (HCC) 08/08/2022   Excessive daytime sleepiness 08/02/2021   Allergic rhinitis 01/03/2020   Plantar wart of right foot 05/07/2019   Plantar fasciitis, right 05/07/2019   Positive colorectal cancer screening using Cologuard test 01/27/2019   Type 2 diabetes mellitus with neurological complications (HCC) 07/03/2018   Onychomycosis 07/03/2018   Excessive drinking alcohol 07/03/2018   Headache 05/08/2016   Annual physical exam 07/07/2015   Erectile dysfunction due to arterial insufficiency 12/23/2014   Left knee pain 02/18/2014   Mild obstructive sleep apnea with snoring 01/17/2014   Erythrocytosis 04/23/2013   Lateral epicondylitis of both elbows 04/23/2013   LPRD (laryngopharyngeal reflux disease) 02/26/2013   Elevated serum creatinine 02/06/2012   Elbow mass 01/31/2012   Testosterone  deficiency  11/03/2010   B12 deficiency 11/03/2010   History of nephrolithiasis 07/20/2010   POLYCYTHEMIA 06/15/2010   IFG (impaired fasting glucose) 06/15/2010   Hypertension 11/24/2006   Gout 11/06/2006   Past Medical History:  Diagnosis Date   Allergic rhinitis 01/03/2020   Allergy    Annual physical exam  07/07/2015   Arthritis    gout   B12 deficiency 11/03/2010   Diabetes mellitus without complication (HCC)    Diabetic nephropathy associated with type 2 diabetes mellitus (HCC) 08/08/2022   Elbow mass 01/31/2012   Elevated serum creatinine 02/06/2012   Erectile dysfunction due to arterial insufficiency 12/23/2014   Erythrocytosis 04/23/2013   Present for testosterone  supplementation.  Gets regular phlebotomies with hematology/oncology, Dr. Slatkoff.     Excessive daytime sleepiness 08/02/2021   Excessive drinking alcohol 07/03/2018   Gout    Gout 11/06/2006   Headache    History of nephrolithiasis 07/20/2010   Hypertension    IFG (impaired fasting glucose)    Kidney stones    Lateral epicondylitis of both elbows 04/23/2013   Left knee pain 02/18/2014   LPRD (laryngopharyngeal reflux disease) 02/26/2013   Mild obstructive sleep apnea with snoring 01/17/2014   MRSA (methicillin resistant Staphylococcus aureus)    Onychomycosis 07/03/2018   Plantar fasciitis, right 05/07/2019   Plantar wart of right foot 05/07/2019   POLYCYTHEMIA 06/15/2010   Qualifier: Diagnosis of   By: Waylan DO, Karen         Positive colorectal cancer screening using Cologuard test 01/27/2019   Skin tag 01/03/2023   Testosterone  deficiency 11/03/2010   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     Past Surgical History:  Procedure Laterality Date   COLONOSCOPY     20 yrs ago [facility] normal per pt    ELBOW SURGERY Right    x 2   sinus surgey     tumor removed side of face     age 33 or 60    uvuloplasty for OSA     Social History[1]    ROS: as noted in HPI  Objective:     BP 115/77   Pulse 76   Ht 6' 5 (1.956 m)   Wt 256 lb (116.1 kg)   SpO2 97%   BMI 30.36 kg/m  BP Readings from Last 3 Encounters:  07/07/24 115/77  06/14/24 (!) 130/95  06/14/24 (!) 149/103   Wt Readings  from Last 3 Encounters:  07/07/24 256 lb (116.1 kg)  06/14/24 256 lb 12.8 oz (116.5 kg)  04/13/24 249 lb 1.9 oz (113 kg)      Physical Exam Vitals and nursing note reviewed.  Constitutional:      General: He is not in acute distress.    Appearance: Normal appearance. He is not ill-appearing, toxic-appearing or diaphoretic.  HENT:     Head: Normocephalic and atraumatic.     Right Ear: External ear normal.     Left Ear: External ear normal.     Nose: Nose normal. No congestion.     Comments: Slate-gray colored nasal tip    Mouth/Throat:     Pharynx: Oropharynx is clear. No pharyngeal swelling or posterior oropharyngeal erythema.     Comments: Uvula surgically absent Eyes:     General: No scleral icterus.    Pupils: Pupils are equal, round, and reactive to light.  Cardiovascular:     Rate and Rhythm: Normal rate. Rhythm  irregular.  Pulmonary:     Effort: Pulmonary effort is normal. No respiratory distress.     Breath sounds: No stridor. No wheezing or rhonchi.  Musculoskeletal:     Right lower leg: No edema.     Left lower leg: No edema.  Skin:    General: Skin is warm and dry.     Findings: No erythema or rash.     Comments: Skin discoloration noted from hemochromatosis  Neurological:     General: No focal deficit present.     Mental Status: He is alert and oriented to person, place, and time.    EKG: atrial fibrillation, rate 78, RBBB. PVCs noted   No results found for any visits on 07/07/24.  Last CBC Lab Results  Component Value Date   WBC 8.3 06/14/2024   HGB 18.7 (H) 06/14/2024   HCT 55.4 (H) 06/14/2024   MCV 87.1 06/14/2024   MCH 29.4 06/14/2024   RDW 15.3 06/14/2024   PLT 183 06/14/2024   Last metabolic panel Lab Results  Component Value Date   GLUCOSE 137 (H) 06/14/2024   NA 140 06/14/2024   K 4.2 06/14/2024   CL 102 06/14/2024   CO2 26 06/14/2024   BUN 17 06/14/2024   CREATININE 1.43 (H) 06/14/2024   GFRNONAA 58 (L) 06/14/2024   CALCIUM  9.8  06/14/2024   PROT 7.6 06/14/2024   ALBUMIN 4.1 06/14/2024   LABGLOB 2.9 01/05/2024   BILITOT 0.6 06/14/2024   ALKPHOS 99 06/14/2024   AST 31 06/14/2024   ALT 38 06/14/2024   ANIONGAP 12 06/14/2024   Last lipids Lab Results  Component Value Date   CHOL 169 01/05/2024   HDL 43 01/05/2024   LDLCALC 110 (H) 01/05/2024   LDLDIRECT 107 (H) 10/19/2010   TRIG 86 01/05/2024   CHOLHDL 3.9 01/05/2024   Last hemoglobin A1c Lab Results  Component Value Date   HGBA1C 8.0 (H) 01/05/2024   Last thyroid functions Lab Results  Component Value Date   TSH 1.750 01/05/2024   Last vitamin D  Lab Results  Component Value Date   VD25OH 54 03/14/2017   Last vitamin B12 and Folate Lab Results  Component Value Date   VITAMINB12 >2000 (H) 01/05/2024   FOLATE 8.6 01/05/2024      The 10-year ASCVD risk score (Arnett DK, et al., 2019) is: 11.5%  Assessment & Plan:  Type 2 diabetes mellitus with neurological complications (HCC) -     CBC with Differential/Platelet -     Comprehensive metabolic panel with GFR -     TSH -     Lipid panel -     Hemoglobin A1c  Testosterone  deficiency -     Jatenzo ; Take 1 capsule (237 mg total) by mouth in the morning and at bedtime.  Dispense: 60 capsule; Refill: 5 -     Testosterone ,Free and Total  B12 deficiency -     B12 and Folate Panel  Hypertension associated with diabetes (HCC)  Erectile dysfunction due to arterial insufficiency  Hereditary hemochromatosis -     Jatenzo ; Take 1 capsule (237 mg total) by mouth in the morning and at bedtime.  Dispense: 60 capsule; Refill: 5 -     CBC with Differential/Platelet  Dyspnea on exertion -     Ambulatory referral to Cardiology -     Apixaban  Starter Pack; Take as directed on package: start with two-5mg  tablets twice daily for 7 days. On day 8, switch to one-5mg  tablet twice daily.  Dispense: 26  each; Refill: 0 -     DG Chest 2 View; Future -     Brain natriuretic peptide -     EKG  12-Lead  Erythrocytosis -     Jatenzo ; Take 1 capsule (237 mg total) by mouth in the morning and at bedtime.  Dispense: 60 capsule; Refill: 5 -     CBC with Differential/Platelet  Injection site reaction, initial encounter -     Jatenzo ; Take 1 capsule (237 mg total) by mouth in the morning and at bedtime.  Dispense: 60 capsule; Refill: 5  Atrial fibrillation, unspecified type [facility]) -     Ambulatory referral to Cardiology -     Apixaban  Starter Pack; Take as directed on package: start with two-5mg  tablets twice daily for 7 days. On day 8, switch to one-5mg  tablet twice daily.  Dispense: 74 each; Refill: 0 -     TSH -     T4, free -     Magnesium -     EKG 12-Lead  RBBB -     Ambulatory referral to Cardiology -     Apixaban  Starter Pack; Take as directed on package: start with two-5mg  tablets twice daily for 7 days. On day 8, switch to one-5mg  tablet twice daily.  Dispense: 74 each; Refill: 0 -     EKG 12-Lead  Idiopathic gout, unspecified chronicity, unspecified site -     Allopurinol ; Take 1 tablet (300 mg total) by mouth daily.  Dispense: 90 tablet; Refill: 3 -     Uric acid  Assessment and Plan    Type 2 diabetes mellitus with neurological complications Suboptimal glycemic control with A1c at 8.0%. Proteinuria suggests potential kidney involvement. Discussed adding SGLT2 inhibitors for renal and cardiovascular protection. - Ordered A1c test. - Consider increasing Ozempic  to max dose if A1c remains above 6.5%. - Consider adding Farxiga or Jardiance  if A1c remains above 6.5%.  Testosterone  deficiency Discontinued injections due to elevated hemoglobin. Discussed Jatenzo  as an alternative with lower hemoglobin risk and cost considerations. - Initiated process to obtain Jatenzo  through specialty pharmacy. - did not refill testosterone  injection as pt has been off x 1 year at this point. Recheck levels today.  Atrial fibrillation New exertional dyspnea without chest pain. Recent  alcohol intake noted as potential arrhythmia trigger. - Performed EKG showing atrial fibrillation with premature contractions. - CHADVASC score: 2. Will start eliquis  taper - urgent cardiology referral placed - pt to avoid caffeine and alcohol  Hereditary hemochromatosis - Continue monitoring hemoglobin levels with hematology.  B12 deficiency Managed with oral supplementation. - Continue oral B12 supplementation.  Idiopathic gout Managed with allopurinol . - Continue allopurinol .      I spent 45 minutes of total time managing this patient today, this includes chart review, face to face, and non-face to face time, reviewing outside records and labs and providing personal interpretation.    Return in about 4 weeks (around 08/04/2024).   Benton LITTIE Gave, PA     [1]  Social History Tobacco Use   Smoking status: Never   Smokeless tobacco: Never  Vaping Use   Vaping status: Never Used  Substance Use Topics   Alcohol use: Yes    Alcohol/week: 42.0 standard drinks of alcohol    Types: 42 Cans of beer per week    Comment: 6-8 beers a day   Drug use: No   "

## 2024-07-07 NOTE — Patient Instructions (Addendum)
 Please go down to [address] to get a chest xray.  Continue all your previous medications as ordered. I have added eliquis  starter pack. This will help prevent a blood clot with your new onset A.fib.  I have placed an order for urgent cardiology evaluation.  I will contact you tomorrow regarding your lab results and if adjustments need to be made pending your A1C results. We will not restart testosterone  until after cardiac eval completed. If we need to restart, lets switch to Jatenzo  which is an oral form less likely to cause elevated Hgb levels.  Please come back and see me in 3-4 weeks.

## 2024-07-08 MED ORDER — EMPAGLIFLOZIN 10 MG PO TABS: 10.0000 mg | ORAL_TABLET | Freq: Every day | ORAL | 3 refills | Status: AC

## 2024-07-08 MED ORDER — ROSUVASTATIN CALCIUM 5 MG PO TABS: 5.0000 mg | ORAL_TABLET | Freq: Every day | ORAL | 1 refills | Status: AC

## 2024-07-08 MED ORDER — SEMAGLUTIDE (2 MG/DOSE) 8 MG/3ML ~~LOC~~ SOPN: 2.0000 mg | PEN_INJECTOR | SUBCUTANEOUS | 6 refills | Status: AC

## 2024-07-09 LAB — COMPREHENSIVE METABOLIC PANEL WITH GFR
ALT: 39 IU/L (ref 0–44)
AST: 33 IU/L (ref 0–40)
Albumin: 4.1 g/dL (ref 3.8–4.9)
Alkaline Phosphatase: 102 IU/L (ref 47–123)
BUN/Creatinine Ratio: 11 (ref 9–20)
BUN: 16 mg/dL (ref 6–24)
Bilirubin Total: 0.8 mg/dL (ref 0.0–1.2)
CO2: 19 mmol/L — ABNORMAL LOW (ref 20–29)
Calcium: 10.1 mg/dL (ref 8.7–10.2)
Chloride: 100 mmol/L (ref 96–106)
Creatinine, Ser: 1.51 mg/dL — ABNORMAL HIGH (ref 0.76–1.27)
Globulin, Total: 2.5 g/dL (ref 1.5–4.5)
Glucose: 156 mg/dL — ABNORMAL HIGH (ref 70–99)
Potassium: 4.7 mmol/L (ref 3.5–5.2)
Sodium: 137 mmol/L (ref 134–144)
Total Protein: 6.6 g/dL (ref 6.0–8.5)
eGFR: 54 mL/min/1.73 — ABNORMAL LOW

## 2024-07-09 LAB — LIPID PANEL
Chol/HDL Ratio: 4.6 ratio (ref 0.0–5.0)
Cholesterol, Total: 161 mg/dL (ref 100–199)
HDL: 35 mg/dL — ABNORMAL LOW
LDL Chol Calc (NIH): 108 mg/dL — ABNORMAL HIGH (ref 0–99)
Triglycerides: 94 mg/dL (ref 0–149)
VLDL Cholesterol Cal: 18 mg/dL (ref 5–40)

## 2024-07-09 LAB — URIC ACID: Uric Acid: 6 mg/dL (ref 3.8–8.4)

## 2024-07-09 LAB — MAGNESIUM: Magnesium: 1.7 mg/dL (ref 1.6–2.3)

## 2024-07-09 LAB — BRAIN NATRIURETIC PEPTIDE: BNP: 116.7 pg/mL — ABNORMAL HIGH (ref 0.0–100.0)

## 2024-07-09 LAB — CBC WITH DIFFERENTIAL/PLATELET
Basophils Absolute: 0.1 x10E3/uL (ref 0.0–0.2)
Basos: 1 %
EOS (ABSOLUTE): 0.2 x10E3/uL (ref 0.0–0.4)
Eos: 3 %
Hematocrit: 55.7 % — ABNORMAL HIGH (ref 37.5–51.0)
Hemoglobin: 18.2 g/dL — ABNORMAL HIGH (ref 13.0–17.7)
Immature Grans (Abs): 0 x10E3/uL (ref 0.0–0.1)
Immature Granulocytes: 0 %
Lymphocytes Absolute: 1.1 x10E3/uL (ref 0.7–3.1)
Lymphs: 14 %
MCH: 28.9 pg (ref 26.6–33.0)
MCHC: 32.7 g/dL (ref 31.5–35.7)
MCV: 89 fL (ref 79–97)
Monocytes Absolute: 0.8 x10E3/uL (ref 0.1–0.9)
Monocytes: 9 %
Neutrophils Absolute: 5.8 x10E3/uL (ref 1.4–7.0)
Neutrophils: 72 %
Platelets: 203 x10E3/uL (ref 150–450)
RBC: 6.29 x10E6/uL — ABNORMAL HIGH (ref 4.14–5.80)
RDW: 14.3 % (ref 11.6–15.4)
WBC: 8 x10E3/uL (ref 3.4–10.8)

## 2024-07-09 LAB — T4, FREE: Free T4: 1.23 ng/dL (ref 0.82–1.77)

## 2024-07-09 LAB — TSH: TSH: 1.7 u[IU]/mL (ref 0.450–4.500)

## 2024-07-09 LAB — HEMOGLOBIN A1C
Est. average glucose Bld gHb Est-mCnc: 171 mg/dL
Hgb A1c MFr Bld: 7.6 % — ABNORMAL HIGH (ref 4.8–5.6)

## 2024-07-09 LAB — TESTOSTERONE,FREE AND TOTAL
Testosterone, Free: 9.4 pg/mL (ref 7.2–24.0)
Testosterone: 445 ng/dL (ref 264–916)

## 2024-07-09 LAB — B12 AND FOLATE PANEL
Folate: 12.3 ng/mL
Vitamin B-12: 1353 pg/mL — ABNORMAL HIGH (ref 232–1245)

## 2024-07-15 ENCOUNTER — Ambulatory Visit

## 2024-07-15 VITALS — BP 120/78 | HR 86 | Ht 77.0 in | Wt 254.4 lb

## 2024-07-15 DIAGNOSIS — I10 Essential (primary) hypertension: Secondary | ICD-10-CM

## 2024-07-15 DIAGNOSIS — E782 Mixed hyperlipidemia: Secondary | ICD-10-CM

## 2024-07-15 DIAGNOSIS — I4891 Unspecified atrial fibrillation: Secondary | ICD-10-CM | POA: Diagnosis not present

## 2024-07-15 DIAGNOSIS — E785 Hyperlipidemia, unspecified: Secondary | ICD-10-CM | POA: Insufficient documentation

## 2024-07-15 NOTE — Assessment & Plan Note (Signed)
 Well-controlled. Continue metoprolol  tartrate 100 mg twice daily Valsartan -hydrochlorothiazide  320-25 mg once daily

## 2024-07-15 NOTE — Assessment & Plan Note (Signed)
 New diagnosis at recent PCP office visit. CHA2DS2-VASc score 2 Rates appear controlled. He remains asymptomatic. Hemodynamically stable.  Remains on metoprolol  to tartrate 100 mg twice daily.  Discussed about potential risk factors for A-fib such as age, alcohol consumption, sleep apnea,  Unclear the duration of A-fib in his case given his asymptomatic nature.  Discussed rhythm control versus rate control. Given young age and new diagnosis would recommend rhythm control. Once he is on appropriate anticoagulation for at least 3 to 4 weeks and if he persistent A-fib will schedule for cardioversion. He is agreeable.  Options for antiarrhythmic drugs versus ablation if A-fib persists or recurs reviewed.  Stroke prophylaxis with anticoagulation discussed. Recommended to start Eliquis  as prescribed by PCP. He is agreeable. Alternate options for Watchman device if he is a candidate in future were reviewed.

## 2024-07-15 NOTE — Assessment & Plan Note (Signed)
 Lipid panel recently from January 2026 not optimal considering his diabetes. LDL target less than 70 mg/dL. Role for statins in the setting reviewed and recommended to start rosuvastatin  as prescribed by PCP.

## 2024-07-15 NOTE — Progress Notes (Signed)
 "  Cardiology Consultation:    Date:  07/15/2024   ID:  Jonathan Marcos Raddle., DOB [birth year], MRN [id number]  PCP:  Jonathan Benton CROME, PA  Cardiologist:  Jonathan JONELLE Kobus, MD   Referring MD: Jonathan Benton CROME, PA   No chief complaint on file.    ASSESSMENT AND PLAN:   Mr Jonathan Oliver 59 year old male patient newly diagnosed atrial fibrillation, diabetes mellitus, heterozygous hereditary hemochromatosis [undergoes phlebotomies], hypogonadism [was on testosterone  replacement but more recently not taking any for several months], gout, renal stones, OSA s/p uvulectomy, hypertension, CKD stage III.  Right bundle branch block noted on EKG today.  Moderate alcohol consumption [2-4 beers per day].  Non-smoker. Denies any prior history of CAD, CHF, MI, CVA.   Problem List Items Addressed This Visit       Cardiovascular and Mediastinum   Hypertension   Well-controlled. Continue metoprolol  tartrate 100 mg twice daily Valsartan -hydrochlorothiazide  320-25 mg once daily       Atrial fibrillation (HCC) - Primary   New diagnosis at recent PCP office visit. CHA2DS2-VASc score 2 Rates appear controlled. He remains asymptomatic. Hemodynamically stable.  Remains on metoprolol  to tartrate 100 mg twice daily.  Discussed about potential risk factors for A-fib such as age, alcohol consumption, sleep apnea,  Unclear the duration of A-fib in his case given his asymptomatic nature.  Discussed rhythm control versus rate control. Given young age and new diagnosis would recommend rhythm control. Once he is on appropriate anticoagulation for at least 3 to 4 weeks and if he persistent A-fib will schedule for cardioversion. He is agreeable.  Options for antiarrhythmic drugs versus ablation if A-fib persists or recurs reviewed.  Stroke prophylaxis with anticoagulation discussed. Recommended to start Eliquis  as prescribed by PCP. He is agreeable. Alternate options for Watchman device if he is a  candidate in future were reviewed.      Relevant Orders   EKG 12-Lead (Completed)   ECHOCARDIOGRAM COMPLETE     Other   Hyperlipidemia   Lipid panel recently from January 2026 not optimal considering his diabetes. LDL target less than 70 mg/dL. Role for statins in the setting reviewed and recommended to start rosuvastatin  as prescribed by PCP.       Return to clinic tentatively in 1 month.   History of Present Illness:    Jonathan Mahan. is a 59 y.o. male who is being seen today for the evaluation of atrial fibrillation at the request of Jonathan Benton L, [place].  Follows up with hematology for hemochromatosis.  Here for the visit today accompanied by his wife.  Lives at home with his wife.  Works in aeronautical engineer.  Has adult children in their 30s.  Has history of newly diagnosed atrial fibrillation, diabetes mellitus, heterozygous hereditary hemochromatosis [undergoes phlebotomies], hypogonadism [was on testosterone  replacement but more recently not taking any for several months], gout, renal stones, OSA s/p uvulectomy, hypertension, CKD stage III.  Moderate alcohol consumption [2-4 beers per day].  Non-smoker. Denies any prior history of CAD, CHF, MI, CVA.  Good functional status at baseline. Relatively asymptomatic from cardiac standpoint.  1 episode of short of breath a week ago while working out in the cold.  Resolved quickly once he came indoors. Denies any chest pain, palpitation, lightheadedness, dizziness or syncopal episodes. Excellent functional status at home and outdoors with his work which involves significant physical exertion.  Denies any blood in urine or stools. No significant weight changes recently.  At recent PCP office visit noted to have  irregular heartbeat and diagnosed with atrial fibrillation on EKG. Subsequently Eliquis  was prescribed which she is yet to begin.  He denies any prior awareness of irregular heartbeat.  Does not smoke. Does not drink  coffee or tea or energy drinks. Consumes alcohol 2-4 beers a day.  He was also recently started on rosuvastatin  which he is yet to fill up the prescription and begin.  EKG in the clinic today shows atrial fibrillation with ventricular rate 86/min, QRS duration 158 ms consistent with RBBB morphology.  Blood work from 07/07/2024 hemoglobin A1c 7.6 BNP 116.7 Hemoglobin 18.2, hematocrit 55.7, WBC 8, platelets 203 BUN 16, creatinine 1.51, eGFR 54 Normal transaminases and alkaline phosphatase TSH normal 1.7 Free T4 normal 1.23 Lipid panel total cholesterol 161, HDL 35, LDL 108, triglycerides 94. Magnesium 1.7   Past Medical History:  Diagnosis Date   Allergic rhinitis 01/03/2020   Allergy    Annual physical exam 07/07/2015   Arthritis    gout   B12 deficiency 11/03/2010   Diabetes mellitus without complication (HCC)    Diabetic nephropathy associated with type 2 diabetes mellitus (HCC) 08/08/2022   Elbow mass 01/31/2012   Elevated serum creatinine 02/06/2012   Erectile dysfunction due to arterial insufficiency 12/23/2014   Erythrocytosis 04/23/2013   Present for testosterone  supplementation.  Gets regular phlebotomies with hematology/oncology, Dr. Slatkoff.     Excessive daytime sleepiness 08/02/2021   Excessive drinking alcohol 07/03/2018   Gout    Gout 11/06/2006   Headache    History of nephrolithiasis 07/20/2010   Hypertension    IFG (impaired fasting glucose)    Kidney stones    Lateral epicondylitis of both elbows 04/23/2013   Left knee pain 02/18/2014   LPRD (laryngopharyngeal reflux disease) 02/26/2013   Mild obstructive sleep apnea with snoring 01/17/2014   MRSA (methicillin resistant Staphylococcus aureus)    Onychomycosis 07/03/2018   Plantar fasciitis, right 05/07/2019   Plantar wart of right foot 05/07/2019   POLYCYTHEMIA 06/15/2010   Qualifier: Diagnosis of   By: Waylan DO, Karen         Positive colorectal cancer screening using Cologuard test 01/27/2019    Skin tag 01/03/2023   Testosterone  deficiency 11/03/2010   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      Past Surgical History:  Procedure Laterality Date   COLONOSCOPY     20 yrs ago [place] normal per pt    ELBOW SURGERY Right    x 2   sinus surgey     tumor removed side of face     age 44 or 37    uvuloplasty for OSA      Current Medications: Active Medications[1]   Allergies:   Patient has no known allergies.   Social History   Socioeconomic History   Marital status: Married    Spouse name: Not on file   Number of children: 3   Years of education: HS   Highest education level: Not on file  Occupational History   Occupation: Maintenance  Tobacco Use   Smoking status: Never   Smokeless tobacco: Never  Vaping Use   Vaping status: Never Used  Substance and Sexual Activity   Alcohol use: Yes    Alcohol/week: 42.0 standard drinks of alcohol    Types: 42 Cans of beer per week    Comment: 6-8 beers a day   Drug use: No   Sexual activity: Not  on file  Other Topics Concern   Not on file  Social History Narrative   Lives at home with his spouse.   Right-handed.   1 can of [facility] per day.   Social Drivers of Health   Tobacco Use: Low Risk (07/15/2024)   Patient History    Smoking Tobacco Use: Never    Smokeless Tobacco Use: Never    Passive Exposure: Not on file  Financial Resource Strain: Not on file  Food Insecurity: Not on file  Transportation Needs: Not on file  Physical Activity: Not on file  Stress: Not on file  Social Connections: Unknown (11/02/2021)   Received from [facility]   Social Network    Social Network: Not on file  Depression (PHQ2-9): Low Risk (07/07/2024)   Depression (PHQ2-9)    PHQ-2 Score: 0  Alcohol Screen: Not on file  Housing: Not on file  Utilities: Not on file  Health Literacy: Not on file     Family  History: The patient's family history includes Cancer in his father and mother; Diabetes in his maternal aunt and maternal grandfather. There is no history of Hypertension, Colon cancer, Colon polyps, Esophageal cancer, Rectal cancer, or Stomach cancer. ROS:   Please see the history of present illness.    All 14 point review of systems negative except as described per history of present illness.  EKGs/Labs/Other Studies Reviewed:    The following studies were reviewed today:   EKG:  EKG Interpretation Date/Time:  Thursday July 15 2024 08:29:13 EST Ventricular Rate:  86 PR Interval:    QRS Duration:  158 QT Interval:  422 QTC Calculation: 504 R Axis:   88  Text Interpretation: Atrial fibrillation Right bundle branch block No previous ECGs available Confirmed by Liborio Hai reddy [phone]) on 07/15/2024 8:38:27 AM    Recent Labs: 07/07/2024: ALT 39; BNP 116.7; BUN 16; Creatinine, Ser 1.51; Hemoglobin 18.2; Magnesium 1.7; Platelets 203; Potassium 4.7; Sodium 137; TSH 1.700  Recent Lipid Panel    Component Value Date/Time   CHOL 161 07/07/2024 0956   TRIG 94 07/07/2024 0956   HDL 35 (L) 07/07/2024 0956   CHOLHDL 4.6 07/07/2024 0956   CHOLHDL 4.2 08/02/2022 0833   VLDL 17 07/07/2015 1610   LDLCALC 108 (H) 07/07/2024 0956   LDLCALC 104 (H) 08/02/2022 0833   LDLDIRECT 107 (H) 10/19/2010 1146    Physical Exam:    VS:  BP 120/78   Pulse 86   Ht 6' 5 (1.956 m)   Wt 254 lb 6.4 oz (115.4 kg)   SpO2 99%   BMI 30.17 kg/m     Wt Readings from Last 3 Encounters:  07/15/24 254 lb 6.4 oz (115.4 kg)  07/07/24 256 lb (116.1 kg)  06/14/24 256 lb 12.8 oz (116.5 kg)     GENERAL:  Well nourished, well developed in no acute distress NECK: No JVD; No carotid bruits CARDIAC: Irregularly irregular, S1 and S2 present, no murmurs, no rubs, no gallops CHEST:  Clear to auscultation without rales, wheezing or rhonchi  Extremities: No pitting pedal edema. Pulses bilaterally symmetric  with radial 2+ and dorsalis pedis 2+ NEUROLOGIC:  Alert and oriented x 3  Medication Adjustments/Labs and Tests Ordered: Current medicines are reviewed at length with the patient today.  Concerns regarding medicines are outlined above.  Orders Placed This Encounter  Procedures   EKG 12-Lead   ECHOCARDIOGRAM COMPLETE   No orders of the defined types were placed in this encounter.   Signed,  Darryel Diodato reddy Shyquan Stallbaumer, MD, MPH, [facility]. 07/15/2024 9:11 AM    [facility]     [1]  Current Meds  Medication Sig   ACCU-CHEK GUIDE test strip TEST 1-4 TIMES DAILY   Accu-Chek Softclix Lancets lancets TEST 1-4 TIMES DAILY   allopurinol  (ZYLOPRIM ) 300 MG tablet Take 1 tablet (300 mg total) by mouth daily.   AMBULATORY NON FORMULARY MEDICATION Single glucometer with lancets, test strips Test daily.  Disp qs x 3 months. E11.49   AMBULATORY NON FORMULARY MEDICATION Single glucometer with lancets, test strips, patient to test 1-4 times daily.   Blood Pressure Monitoring (ADULT BLOOD PRESSURE CUFF LG) KIT Use as needed   cyanocobalamin  (VITAMIN B12) 1000 MCG tablet Take 1 tablet (1,000 mcg total) by mouth daily.   empagliflozin  (JARDIANCE ) 10 MG TABS tablet Take 1 tablet (10 mg total) by mouth daily.   metoprolol  tartrate (LOPRESSOR ) 100 MG tablet TAKE 1 TABLET BY MOUTH TWICE A DAY   rosuvastatin  (CRESTOR ) 5 MG tablet Take 1 tablet (5 mg total) by mouth at bedtime.   Semaglutide , 2 MG/DOSE, 8 MG/3ML SOPN Inject 2 mg as directed once a week.   tadalafil  (CIALIS ) 5 MG tablet TAKE 1 TABLET BY MOUTH DAILY AS NEEDED FOR ERECTILE DYSFUNCTION   testosterone  cypionate (DEPOTESTOSTERONE CYPIONATE) 200 MG/ML injection Inject 1 mL (200 mg total) into the muscle every 14 (fourteen) days.   Testosterone  Undecanoate (JATENZO ) 237 MG CAPS Take 1 capsule (237 mg total) by mouth in the morning and at bedtime.   valsartan -hydrochlorothiazide  (DIOVAN -HCT) 320-25 MG tablet Take 1 tablet by mouth  daily.   "

## 2024-07-15 NOTE — Patient Instructions (Signed)
 Medication Instructions:  Your physician recommends that you continue on your current medications as directed. Please refer to the Current Medication list given to you today.  *If you need a refill on your cardiac medications before your next appointment, please call your pharmacy*  Lab Work: None If you have labs (blood work) drawn today and your tests are completely normal, you will receive your results only by: MyChart Message (if you have MyChart) OR A paper copy in the mail If you have any lab test that is abnormal or we need to change your treatment, we will call you to review the results.  Testing/Procedures: Your physician has requested that you have an echocardiogram. Echocardiography is a painless test that uses sound waves to create images of your heart. It provides your doctor with information about the size and shape of your heart and how well your heart's chambers and valves are working. This procedure takes approximately one hour. There are no restrictions for this procedure. Please do NOT wear cologne, perfume, aftershave, or lotions (deodorant is allowed). Please arrive 15 minutes prior to your appointment time.  Please note: We ask at that you not bring children with you during ultrasound (echo/ vascular) testing. Due to room size and safety concerns, children are not allowed in the ultrasound rooms during exams. Our front office staff cannot provide observation of children in our lobby area while testing is being conducted. An adult accompanying a patient to their appointment will only be allowed in the ultrasound room at the discretion of the ultrasound technician under special circumstances. We apologize for any inconvenience.   Follow-Up: At [facility], you and your health needs are our priority.  As part of our continuing mission to provide you with exceptional heart care, our providers are all part of one team.  This team includes your primary Cardiologist  (physician) and Advanced Practice Providers or APPs (Physician Assistants and Nurse Practitioners) who all work together to provide you with the care you need, when you need it.  Your next appointment:   1 month(s)  Provider:   Huntley Dec, MD    We recommend signing up for the patient portal called "MyChart".  Sign up information is provided on this After Visit Summary.  MyChart is used to connect with patients for Virtual Visits (Telemedicine).  Patients are able to view lab/test results, encounter notes, upcoming appointments, etc.  Non-urgent messages can be sent to your provider as well.   To learn more about what you can do with MyChart, go to [email or web address].   Other Instructions None

## 2024-07-30 ENCOUNTER — Other Ambulatory Visit: Payer: Self-pay

## 2024-07-30 MED ORDER — VALSARTAN-HYDROCHLOROTHIAZIDE 320-25 MG PO TABS: 1.0000 | ORAL_TABLET | Freq: Every day | ORAL | 1 refills | Status: AC

## 2024-08-03 ENCOUNTER — Ambulatory Visit (HOSPITAL_BASED_OUTPATIENT_CLINIC_OR_DEPARTMENT_OTHER)

## 2024-08-04 ENCOUNTER — Ambulatory Visit: Admitting: Family Medicine

## 2024-08-09 ENCOUNTER — Inpatient Hospital Stay

## 2024-08-09 ENCOUNTER — Inpatient Hospital Stay: Admitting: Family

## 2024-08-17 ENCOUNTER — Ambulatory Visit
